# Patient Record
Sex: Male | Born: 1990
Health system: Southern US, Community
[De-identification: ages and names within clinical notes are randomized; demographics above are authoritative.]

## PROBLEM LIST (undated history)

## (undated) DIAGNOSIS — K219 Gastro-esophageal reflux disease without esophagitis: Secondary | ICD-10-CM

## (undated) DIAGNOSIS — K589 Irritable bowel syndrome without diarrhea: Secondary | ICD-10-CM

## (undated) DIAGNOSIS — K5090 Crohn's disease, unspecified, without complications: Secondary | ICD-10-CM

## (undated) DIAGNOSIS — T7840XA Allergy, unspecified, initial encounter: Secondary | ICD-10-CM

## (undated) DIAGNOSIS — A048 Other specified bacterial intestinal infections: Secondary | ICD-10-CM

## (undated) DIAGNOSIS — F419 Anxiety disorder, unspecified: Secondary | ICD-10-CM

## (undated) DIAGNOSIS — K3532 Acute appendicitis with perforation and localized peritonitis, without abscess: Secondary | ICD-10-CM

## (undated) DIAGNOSIS — K529 Noninfective gastroenteritis and colitis, unspecified: Secondary | ICD-10-CM

## (undated) DIAGNOSIS — K635 Polyp of colon: Secondary | ICD-10-CM

## (undated) DIAGNOSIS — K632 Fistula of intestine: Secondary | ICD-10-CM

## (undated) HISTORY — DX: Acute appendicitis with perforation, localized peritonitis, and gangrene, without abscess: K35.32

## (undated) HISTORY — DX: Gastro-esophageal reflux disease without esophagitis: K21.9

## (undated) HISTORY — DX: Allergy, unspecified, initial encounter: T78.40XA

## (undated) HISTORY — DX: Fistula of intestine: K63.2

## (undated) HISTORY — PX: APPENDECTOMY: SHX54

## (undated) HISTORY — DX: Anxiety disorder, unspecified: F41.9

## (undated) HISTORY — DX: Crohn's disease, unspecified, without complications: K50.90

## (undated) HISTORY — DX: Other specified bacterial intestinal infections: A04.8

## (undated) HISTORY — DX: Noninfective gastroenteritis and colitis, unspecified: K52.9

## (undated) HISTORY — DX: Polyp of colon: K63.5

## (undated) HISTORY — DX: Irritable bowel syndrome, unspecified: K58.9

## (undated) HISTORY — PX: OTHER SURGICAL HISTORY: SHX169

---
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
## 2009-11-17 ENCOUNTER — Ambulatory Visit: Payer: Self-pay | Admitting: Internal Medicine

## 2009-11-18 ENCOUNTER — Encounter: Payer: Self-pay | Admitting: Internal Medicine

## 2010-06-06 ENCOUNTER — Telehealth: Payer: Self-pay | Admitting: Internal Medicine

## 2010-12-10 LAB — CONVERTED CEMR LAB
Cholesterol: 195 mg/dL (ref 0–200)
HDL: 50.9 mg/dL (ref >39.00)
Triglycerides: 40 mg/dL (ref 0.0–149.0)
VLDL: 8 mg/dL (ref 0.0–40.0)

## 2010-12-12 NOTE — Progress Notes (Signed)
Summary: ER  Phone Note Call from Patient   Summary of Call: Spoke w/pt's mother. Pt is student at [employer] this summer. He was home this weekend and c/o "not feeling well". No fever or URI symptoms. C/o stomach discomfort and diarrhea sunday evening. Pt spoke to his mother breifly this am prior to going to class this am. He still didn't feel well, drank some water and then vomited. He said there was blood in vomit. She was not sure how much blood or if pt had any other symptoms. Advised ER if pt had more than trace of blood in vomit, weakness, continued diarrhea or any other symptoms. She will attempt to contact pt and call office back with any further questions.  Initial call taken by: Charlsie Quest, [place],  June 06, 2010 10:07 AM  Follow-up for Phone Call        Pts mother called back. She spoke w/pt and he states that the blood was a large "glob". Overall feels very bad and has had continued diarrhea all night. Advised he have someone take him to ER now, not wait until she can get to [facility]. She agreed and will f/u w/Dr Ronnald Ramp after ER.  Follow-up by: Charlsie Quest, [place],  June 06, 2010 10:12 AM  Additional Follow-up for Phone Call Additional follow up Details #1::        thanks Additional Follow-up by: Janith Lima MD,  June 06, 2010 10:24 AM

## 2010-12-12 NOTE — Assessment & Plan Note (Signed)
Summary: BREATHING PROBLEMS- X SEVERAL MONTH-BCBS--PKG/OFF--STC   Vital Signs:  Patient profile:   20 year old male Height:      68 inches Weight:      191 pounds BMI:     29.15 O2 Sat:      97 % on Room air Temp:     98.9 degrees F rectal Pulse rate:   81 / minute Pulse rhythm:   regular Resp:     16 per minute BP sitting:   118 / 80  (left arm) Cuff size:   large  Vitals Entered By: McKenzie (November 17, 2009 1:21 PM)  Nutrition Counseling: Patient's BMI is greater than 25 and therefore counseled on weight management options.  O2 Flow:  Room air CC: New pt/CPX Is Patient Diabetic? No Pain Assessment Patient in pain? no        Primary Care Provider:  Janith Lima MD  CC:  New pt/CPX.  History of Present Illness: New to me for a complete physical. He reports that he feels like he can't get a deep satisfying breath in the AM occasionally, this has been going on for several years with no recent exacerbations.  Preventive Screening-Counseling & Management  Alcohol-Tobacco     Alcohol drinks/day: 2     Alcohol type: beer     >5/day in last 3 mos: yes     Alcohol Counseling: to decrease amount and/or frequency of alcohol intake     Feels need to cut down: no     Feels annoyed by complaints: no     Feels guilty re: drinking: no     Needs 'eye opener' in am: no     Smoking Status: never  Caffeine-Diet-Exercise     Does Patient Exercise: yes  Hep-HIV-STD-Contraception     Hepatitis Risk: no risk noted     HIV Risk: no risk noted     STD Risk: no risk noted     TSE monthly: yes     Testicular SE Education/Counseling to perform regular STE      Drug Use:  no.    Current Medications (verified): 1)  None  Allergies (verified): No Known Drug Allergies  Past History:  Past Medical History: Unremarkable  Past Surgical History: Denies surgical history  Social History: Occupation: Ship broker at [employer] Never Smoked Alcohol  use-yes Drug use-no Regular exercise-yes Smoking Status:  never Hepatitis Risk:  no risk noted HIV Risk:  no risk noted STD Risk:  no risk noted Drug Use:  no Does Patient Exercise:  yes  Review of Systems       The patient complains of weight gain.  The patient denies anorexia, fever, weight loss, chest pain, syncope, dyspnea on exertion, peripheral edema, prolonged cough, headaches, hemoptysis, abdominal pain, severe indigestion/heartburn, hematuria, genital sores, muscle weakness, suspicious skin lesions, difficulty walking, depression, abnormal bleeding, enlarged lymph nodes, and testicular masses.    Physical Exam  General:  alert, well-developed, well-nourished, well-hydrated, appropriate dress, normal appearance, healthy-appearing, cooperative to examination, and good hygiene.   Head:  normocephalic, atraumatic, no abnormalities observed, and no abnormalities palpated.   Ears:  R ear normal and L ear normal.   Mouth:  Oral mucosa and oropharynx without lesions or exudates.  Teeth in good repair. Neck:  supple, full ROM, no masses, no thyromegaly, no JVD, normal carotid upstroke, no carotid bruits, and no cervical lymphadenopathy.   Chest Wall:  No deformities, masses, tenderness or gynecomastia noted. Lungs:  Normal respiratory effort, chest expands symmetrically. Lungs are clear to auscultation, no crackles or wheezes. Heart:  Normal rate and regular rhythm. S1 and S2 normal without gallop, murmur, click, rub or other extra sounds. Abdomen:  soft, non-tender, normal bowel sounds, no distention, no masses, no guarding, no rigidity, no rebound tenderness, no hepatomegaly, and no splenomegaly.   Genitalia:  circumcised, no hydrocele, no varicocele, no scrotal masses, no testicular masses or atrophy, no cutaneous lesions, and no urethral discharge.   Msk:  normal ROM, no joint tenderness, no joint swelling, no joint warmth, no redness over joints, no joint deformities, no joint  instability, and no crepitation.   Pulses:  R and L carotid,radial,femoral,dorsalis pedis and posterior tibial pulses are full and equal bilaterally Extremities:  No clubbing, cyanosis, edema, or deformity noted with normal full range of motion of all joints.   Neurologic:  No cranial nerve deficits noted. Station and gait are normal. Plantar reflexes are down-going bilaterally. DTRs are symmetrical throughout. Sensory, motor and coordinative functions appear intact. Skin:  turgor normal, color normal, no rashes, no suspicious lesions, no ecchymoses, no petechiae, no purpura, no ulcerations, and no edema.   Cervical Nodes:  no anterior cervical adenopathy and no posterior cervical adenopathy.   Axillary Nodes:  no R axillary adenopathy and no L axillary adenopathy.   Inguinal Nodes:  no R inguinal adenopathy and no L inguinal adenopathy.   Psych:  Oriented X3, memory intact for recent and remote, normally interactive, good eye contact, not anxious appearing, not depressed appearing, and not agitated.   Additional Exam:  EKG shows incomplete, insignificant RBBB but otherwise normal.   Impression & Recommendations:  Problem # 1:  ROUTINE GENERAL MEDICAL EXAM@HEALTH  CARE FACL (ICD-V70.0) Assessment New  Td Booster: Historical (11/13/2003)    Discussed using sunscreen, use of alcohol, drug use, self testicular exam, routine dental care, routine eye care, routine physical exam, seat belts, multiple vitamins,  and recommendations for immunizations.  Discussed exercise and checking cholesterol.  Discussed gun safety, safe sex, and contraception.   Orders: EKG w/ Interpretation (93000) Venipuncture (57903) TLB-Lipid Panel (80061-LIPID)  Patient Instructions: 1)  Please schedule a follow-up appointment as needed. 2)  It is important that you exercise regularly at least 20 minutes 5 times a week. If you develop chest pain, have severe difficulty breathing, or feel very tired , stop exercising  immediately and seek medical attention. 3)  You need to lose weight. Consider a lower calorie diet and regular exercise.  4)  If you could be exposed to sexually transmitted diseases, you should use a condom.  Preventive Care Screening  Last Tetanus Booster:    Date:  11/13/2003    Results:  Historical    Appended Document: BREATHING PROBLEMS- X SEVERAL MONTH-BCBS--PKG/OFF--STC    Family History: Family History Diabetes 1st degree relative Family History Lung cancer

## 2010-12-12 NOTE — Letter (Signed)
Summary: Lipid Letter  [facility] [address]   [place], [place] [address]   Phone: [phone]  Fax: [phone]    11/18/2009  [address] [place], [place]  [address]  Dear Randall Hiss:  We have carefully reviewed your last lipid profile from 11/17/2009 and the results are noted below with a summary of recommendations for lipid management.    Cholesterol:       195     Goal: <200   HDL "good" Cholesterol:   50.90     Goal: >40   LDL "bad" Cholesterol:   136     Goal: <130   Triglycerides:       40.0     Goal: <150    GREAT RESULTS!    TLC Diet (Therapeutic Lifestyle Change): Saturated Fats & Transfatty acids should be kept < 7% of total calories ***Reduce Saturated Fats Polyunstaurated Fat can be up to 10% of total calories Monounsaturated Fat Fat can be up to 20% of total calories Total Fat should be no greater than 25-35% of total calories Carbohydrates should be 50-60% of total calories Protein should be approximately 15% of total calories Fiber should be at least 20-30 grams a day ***Increased fiber may help lower LDL Total Cholesterol should be < 276m/day Consider adding plant stanol/sterols to diet (example: Benacol spread) ***A higher intake of unsaturated fat may reduce Triglycerides and Increase HDL    Adjunctive Measures (may lower LIPIDS and reduce risk of Heart Attack) include: Aerobic Exercise (20-30 minutes 3-4 times a week) Limit Alcohol Consumption Weight Reduction Aspirin 75-81 mg a day by mouth (if not allergic or contraindicated) Dietary Fiber 20-30 grams a day by mouth     Current Medications:  None If you have any questions, please call. We appreciate being able to work with you.   Sincerely,    [facility] TJanith LimaMD

## 2011-03-30 NOTE — Op Note (Signed)
[name]  Patient:    Jacob [name], Jacob [name] Visit Number: [id number] MRN: [id number]          Service Type: DSU Location: [facility] Attending Physician:  Gerald Stabs Dictated by:   Jerilynn Mages. Gerald Stabs, M.D. Proc. Date: 02/26/02 Admit Date:  02/26/2002 Discharge Date: 02/26/2002   CC:         Marylou Flesher, M.D.   Operative Report  PREOPERATIVE DIAGNOSIS:  Right upper chest wall nodule.  POSTOPERATIVE DIAGNOSIS:  Right upper chest wall nodule.  PROCEDURE PERFORMED:  Excision of right upper chest wall nodule with layered closure.  ANESTHESIA:  Topical plus local anesthesia.  SURGEON:  Dr. Alcide Goodness.  ASSISTANT:  Nurse.  PROCEDURE IN DETAIL:  The procedure was performed in the minor operating room where the patient was brought in and placed supine on the operating room table. EMLA cream for topical anesthetic effect was placed about 45 minutes prior to the procedure. The right upper chest wall over the nodule was cleaned, prepped and draped in the usual sterile fashion. Approximately 5 cc of 1% lidocaine without epinephrine was infiltrated over and around the nodular swelling in the chest wall. The nodule was marked beforehand for the incision. a transverse incision just above the swelling was then made with the knife. The incision measured about 1.5 - 2 cm. The incision was made after testing for the anesthetic effect of the local anesthesia, and the right dissection into subcutaneous plane was continued with the help of a fine-tipped hemostat around the nodular swelling which was deep to the subcutaneous plane lying above the muscles. A well-circumscribed bilobed nodule was found which was completely dissected. The pedicle contained a fair sized feeding vessel which was divided between _______ and ligated using 5-0 Vicryl. After division of the pedicle-containing vessel, the nodule was free and removed from the field. It measured about 1  cm and was bilobed; sent for histopathology. The wound was now irrigated. Oozing and bleeding ________cauterizing with hand-held battery-operated cautery, and then the wound was closed in 2 layers; the deeper subcutaneous layer was approximated using 5-0 Vicryl interrupted sutures, and skin was closed with 6-0 Prolene pulled through subcuticular stitch. Steri-Strips were applied which was covered with a sterile gauze and ______ dressing. The patient tolerated the procedure very well which was smooth and uneventful. The patient was later allowed to go home with instructions to follow up in 1 week for suture removal. Dictated by:   M. Gerald Stabs, M.D. Attending Physician:  Gerald Stabs DD:  02/27/02 TD:  03/02/02 Job: [id number]

## 2012-07-21 ENCOUNTER — Encounter (INDEPENDENT_AMBULATORY_CARE_PROVIDER_SITE_OTHER): Payer: Self-pay | Admitting: Surgery

## 2012-07-21 ENCOUNTER — Ambulatory Visit (INDEPENDENT_AMBULATORY_CARE_PROVIDER_SITE_OTHER): Payer: BC Managed Care – PPO | Admitting: Surgery

## 2012-07-21 ENCOUNTER — Telehealth (INDEPENDENT_AMBULATORY_CARE_PROVIDER_SITE_OTHER): Payer: Self-pay | Admitting: General Surgery

## 2012-07-21 VITALS — BP 114/80 | HR 78 | Temp 98.3°F | Resp 12 | Ht 68.0 in | Wt 189.8 lb

## 2012-07-21 DIAGNOSIS — K3532 Acute appendicitis with perforation and localized peritonitis, without abscess: Secondary | ICD-10-CM | POA: Insufficient documentation

## 2012-07-21 DIAGNOSIS — K352 Acute appendicitis with generalized peritonitis, without abscess: Secondary | ICD-10-CM

## 2012-07-21 NOTE — Telephone Encounter (Signed)
Mother of pt calling to let Dr. Hassell Done know son is home from [place], where he had appendicitis.  Pt's father spoke with Dr. Hassell Done about treating medically in [place] and postponing surgery until he comes home to [place].  Pt home now on Lortab (prn) Flagyl TID and Levoquin 750 mg QD.   He has all hospital notes and copies of his imaging.  Spoke with Dr. Hassell Done, who advises parents to bring him in to the office now.  Mother understands and will comply.

## 2012-07-21 NOTE — Patient Instructions (Signed)
Thanks for your patience.  If you need further assistance after leaving the office, please call our office and speak with a [facility] nurse.  [phone].  If you want to leave a message for Dr. Hassell Done, please call his office phone at [phone].

## 2012-07-21 NOTE — Progress Notes (Signed)
Chief Complaint:  Ruptured appendicitis diagnosed in [place]  History of Present Illness:  Jacob [name] is an 21 y.o. male from [place] who had abdominal pain developing for several weeks before finally presenting with evidence by CT of a ruptured appendicitis.  He had a CT scan done on September 4 and then on July 20, 2012 and he brought those up on a good which I gave back to him after reviewing. I asked him to take that for his subsequent followup CT scan which he may do in 2-3 weeks. In the meantime he is on Levaquin and metronidazole for 2 weeks. He also has a prescription for Vicodin for pain. Currently he is not really having any the catheter peritoneal signs he had before and is very pain-free. He's not had any fevers or chills is able to take a low-residue diet.  I recommended he stay on a low-residue diet, and maintain his antibiotics reassess him in about 2 weeks. We will then make plans for rescan him in probably 4 scheduling some interval appendectomy.  He asked me about traveling since he is a [employer]. I told him I would wait and we can make a decision after see me again to make sure he is doing well.  No past medical history on file.  No past surgical history on file.  Current Outpatient Prescriptions  Medication Sig Dispense Refill  . HYDROcodone-acetaminophen (LORTAB) 7.5-500 MG per tablet Take 1 tablet by mouth every 4 (four) hours as needed.      Marland Kitchen levofloxacin (LEVAQUIN) 750 MG tablet Take 750 mg by mouth daily.      . metroNIDAZOLE (FLAGYL) 500 MG tablet Take 500 mg by mouth 3 (three) times daily.       Review of patient's allergies indicates no known allergies. Family History  Problem Relation Age of Onset  . Lung cancer     Social History:   reports that he has never smoked. He does not have any smokeless tobacco history on file. He reports that he does not drink alcohol or use illicit drugs.   REVIEW OF SYSTEMS - PERTINENT POSITIVES  ONLY: neg  Physical Exam:   Blood pressure 114/80, pulse 78, temperature 98.3 F (36.8 C), resp. rate 12, height 5' 8"  (1.727 m), weight 189 lb 12.8 oz (86.093 kg). Body mass index is 28.86 kg/(m^2).  Gen:  WDWN WM NAD  Neurological: Alert and oriented to person, place, and time. Motor and sensory function is grossly intact  Head: Normocephalic and atraumatic.  Eyes: Conjunctivae are normal. Pupils are equal, round, and reactive to light. No scleral icterus.  Neck: Normal range of motion. Neck supple. No tracheal deviation or thyromegaly present.  Cardiovascular:  SR without murmurs or gallops.  No carotid bruits Respiratory: Effort normal.  No respiratory distress. No chest wall tenderness. Breath sounds normal.  No wheezes, rales or rhonchi.  Abdomen:  No rebound or guarding.  nontender GU: Musculoskeletal: Normal range of motion. Extremities are nontender. No cyanosis, edema or clubbing noted Lymphadenopathy: No cervical, preauricular, postauricular or axillary adenopathy is present Skin: Skin is warm and dry. No rash noted. No diaphoresis. No erythema. No pallor. Pscyh: Normal mood and affect. Behavior is normal. Judgment and thought content normal.   LABORATORY RESULTS: No results found for this or any previous visit (from the past 48 hour(s)).  RADIOLOGY RESULTS: No results found.  Problem List: Patient Active Problem List  Diagnosis  . Ruptured appendicitis-nonop management begun Sept 2013  Assessment & Plan: Appendiceal abscess/phlegmon.  Will treat with oral antibiotics and followup in 2 weeks.      Matt B. Hassell Done, MD, [facility] [phone] beeper [phone]  07/21/2012 5:01 PM

## 2012-08-02 ENCOUNTER — Telehealth (INDEPENDENT_AMBULATORY_CARE_PROVIDER_SITE_OTHER): Payer: Self-pay | Admitting: General Surgery

## 2012-08-02 NOTE — Telephone Encounter (Signed)
Out of town and called with RLQ pains.  I was in surgery and recommended that he go to the nearest ER for evaluation.  I now called him back after I was out of surgery and he says that he is feeling well now.  He denied fevers or chillis or nausea or bloating/distension.  His pain in improved as well.  I recommended that if he has any return of the pain, fevers, chills, nausea/vomiting that he should go to the ER asap for evaluation. He is scheduled to see Dr. Hassell Done next week.

## 2012-08-04 ENCOUNTER — Telehealth (INDEPENDENT_AMBULATORY_CARE_PROVIDER_SITE_OTHER): Payer: Self-pay | Admitting: General Surgery

## 2012-08-04 DIAGNOSIS — R109 Unspecified abdominal pain: Secondary | ICD-10-CM

## 2012-08-04 DIAGNOSIS — K3532 Acute appendicitis with perforation and localized peritonitis, without abscess: Secondary | ICD-10-CM

## 2012-08-04 MED ORDER — HYDROCODONE-ACETAMINOPHEN 7.5-500 MG PO TABS: 1.0000 | ORAL_TABLET | ORAL | Status: DC | PRN

## 2012-08-04 MED ORDER — METRONIDAZOLE 500 MG PO TABS: 500.0000 mg | ORAL_TABLET | Freq: Three times a day (TID) | ORAL | Status: DC

## 2012-08-04 MED ORDER — LEVOFLOXACIN 750 MG PO TABS: 750.0000 mg | ORAL_TABLET | Freq: Every day | ORAL | Status: DC

## 2012-08-04 NOTE — Telephone Encounter (Signed)
Patient's mother called stating patient has had extreme pain on and off over the weekend status post appendiceal infection and they do not want to wait to be seen on Thursday. He finished his antibiotics yesterday. He is in no pain now but it will shoot up to over a 10/10 and then he will have no pain again. He is having difficulty moving his bowels. He has no appetite. His mother is very concerned. Please advise. Spoke with Dr Hassell Done who wants to continue patient's antibiotics and do a repeat CT scan on patient. Prescriptions called to [facility] on [address] per patient request. CT set up at [place] for tomorrow at 10:30am. Patient aware.

## 2012-08-05 ENCOUNTER — Ambulatory Visit
Admission: RE | Admit: 2012-08-05 | Discharge: 2012-08-05 | Disposition: A | Discharge status: Home / Self Care | Payer: Self-pay | Source: Ambulatory Visit | Attending: Surgery | Admitting: Surgery

## 2012-08-05 ENCOUNTER — Telehealth (INDEPENDENT_AMBULATORY_CARE_PROVIDER_SITE_OTHER): Payer: Self-pay | Admitting: General Surgery

## 2012-08-05 DIAGNOSIS — R109 Unspecified abdominal pain: Secondary | ICD-10-CM

## 2012-08-05 DIAGNOSIS — K3532 Acute appendicitis with perforation and localized peritonitis, without abscess: Secondary | ICD-10-CM

## 2012-08-05 MED ORDER — IOHEXOL 300 MG/ML  SOLN
100.0000 mL | Freq: Once | INTRAMUSCULAR | Status: AC | PRN
  Administered 2012-08-05: 100 mL via INTRAVENOUS

## 2012-08-05 NOTE — Telephone Encounter (Signed)
PTS MOTHER CALLED TO CHECK ON CT SCAN RESULTS AND SEE IF Jacob [name] NEEDS TO BE SEEN ON Thursday? I REVIEWED CT RESULTS WITH DR. MARTIN AND HE SAID HE IS WAITING FOR SECONDARY INFLAMMATION TO GET BETTER AND THAT HE DID NEED TO SEE Jacob [name] ON Thursday TO PHYSICALLY ASSESS MR [place]s CONDITION/PT'S MOTHER NOTIFIED/GY

## 2012-08-07 ENCOUNTER — Ambulatory Visit (INDEPENDENT_AMBULATORY_CARE_PROVIDER_SITE_OTHER): Payer: BC Managed Care – PPO | Admitting: Surgery

## 2012-08-07 ENCOUNTER — Encounter (INDEPENDENT_AMBULATORY_CARE_PROVIDER_SITE_OTHER): Payer: Self-pay | Admitting: Surgery

## 2012-08-07 VITALS — BP 132/76 | HR 74 | Temp 97.8°F | Resp 16 | Ht 68.0 in | Wt 188.2 lb

## 2012-08-07 DIAGNOSIS — K352 Acute appendicitis with generalized peritonitis, without abscess: Secondary | ICD-10-CM

## 2012-08-07 DIAGNOSIS — K3532 Acute appendicitis with perforation and localized peritonitis, without abscess: Secondary | ICD-10-CM

## 2012-08-07 NOTE — Progress Notes (Signed)
Chief Complaint:  Ruptured appendicitis diagnosed in [place]  History of Present Illness:  Jacob [name] is an 21 y.o. male from [place] who had abdominal pain developing for several weeks before finally presenting with evidence by CT of a ruptured appendicitis.  He had a CT scan done on September 4 and then on July 20, 2012 and he brought those up on a good which I gave back to him after reviewing. I asked him to take that for his subsequent followup CT scan which he may do in 2-3 weeks. In the meantime he is on Levaquin and metronidazole for 2 weeks. He also has a prescription for Vicodin for pain. Currently he is not really having any the catheter peritoneal signs he had before and is very pain-free. He's not had any fevers or chills is able to take a low-residue diet.  I recommended he stay on a low-residue diet, and maintain his antibiotics reassess him in about 2 weeks. We will then make plans for rescan him in probably 4 scheduling some interval appendectomy.  He asked me about traveling since he is a [employer]. I told him I would wait and we can make a decision after see me again to make sure he is doing well.  Past Medical History  Diagnosis Date  . Acute appendicitis with rupture     History reviewed. No pertinent past surgical history.  Current Outpatient Prescriptions  Medication Sig Dispense Refill  . HYDROcodone-acetaminophen (LORTAB) 7.5-500 MG per tablet Take 1 tablet by mouth every 4 (four) hours as needed.  30 tablet  0  . levofloxacin (LEVAQUIN) 750 MG tablet Take 1 tablet (750 mg total) by mouth daily.  14 tablet  0  . metroNIDAZOLE (FLAGYL) 500 MG tablet Take 1 tablet (500 mg total) by mouth 3 (three) times daily.  42 tablet  0   Review of patient's allergies indicates no known allergies. Family History  Problem Relation Age of Onset  . Lung cancer     Social History:   reports that he has never smoked. He does not have any smokeless tobacco history  on file. He reports that he does not drink alcohol or use illicit drugs.   REVIEW OF SYSTEMS - PERTINENT POSITIVES ONLY: neg  Physical Exam:   Blood pressure 132/76, pulse 74, temperature 97.8 F (36.6 C), temperature source Temporal, resp. rate 16, height 5' 8"  (1.727 m), weight 188 lb 4 oz (85.39 kg). Body mass index is 28.62 kg/(m^2).  Gen:  WDWN WM NAD  Neurological: Alert and oriented to person, place, and time. Motor and sensory function is grossly intact  Head: Normocephalic and atraumatic.  Eyes: Conjunctivae are normal. Pupils are equal, round, and reactive to light. No scleral icterus.  Neck: Normal range of motion. Neck supple. No tracheal deviation or thyromegaly present.  Cardiovascular:  SR without murmurs or gallops.  No carotid bruits Respiratory: Effort normal.  No respiratory distress. No chest wall tenderness. Breath sounds normal.  No wheezes, rales or rhonchi.  Abdomen:  No rebound or guarding.  nontender GU: Musculoskeletal: Normal range of motion. Extremities are nontender. No cyanosis, edema or clubbing noted Lymphadenopathy: No cervical, preauricular, postauricular or axillary adenopathy is present Skin: Skin is warm and dry. No rash noted. No diaphoresis. No erythema. No pallor. Pscyh: Normal mood and affect. Behavior is normal. Judgment and thought content normal.   LABORATORY RESULTS: No results found for this or any previous visit (from the past 48 hour(s)).  RADIOLOGY RESULTS:  No results found.  Problem List: Patient Active Problem List  Diagnosis  . Ruptured appendicitis-nonop management begun Sept 2013    Assessment & Plan: Appendiceal abscess/phlegmon.  Will treat with oral antibiotics and followup in 2 weeks.      Matt B. Hassell Done, MD, [facility] [phone] beeper [phone]  08/07/2012 5:29 PM  Had a bout of pain while playing cards at [facility].  He won 6th out of 1200.  Still on Levoquin and Flagyl.     We reviewed his CT scan and I hope that we can wait a little longer before appendectomy.  There is a target sign in the TI that may represent inflammation.   Plan see him next Thursday.

## 2012-08-11 ENCOUNTER — Telehealth (INDEPENDENT_AMBULATORY_CARE_PROVIDER_SITE_OTHER): Payer: Self-pay | Admitting: General Surgery

## 2012-08-11 NOTE — Telephone Encounter (Signed)
Pt called to reschedule his appt (needs to be out of town) and to request additional pain med to bridge.  Page and updated Dr. Hassell Done.  OKd to call in Hydrocodone 5/325 mg, # 30, 1-2 po Q4-6H prn pain, no refill.  Called to [place]:  [phone].

## 2012-08-14 ENCOUNTER — Encounter (INDEPENDENT_AMBULATORY_CARE_PROVIDER_SITE_OTHER): Payer: BC Managed Care – PPO | Admitting: Surgery

## 2012-08-27 ENCOUNTER — Ambulatory Visit (INDEPENDENT_AMBULATORY_CARE_PROVIDER_SITE_OTHER): Payer: BC Managed Care – PPO | Admitting: Surgery

## 2012-08-27 ENCOUNTER — Encounter (INDEPENDENT_AMBULATORY_CARE_PROVIDER_SITE_OTHER): Payer: Self-pay | Admitting: Surgery

## 2012-08-27 VITALS — BP 126/80 | HR 72 | Temp 97.8°F | Resp 16 | Ht 68.0 in | Wt 196.0 lb

## 2012-08-27 DIAGNOSIS — K352 Acute appendicitis with generalized peritonitis, without abscess: Secondary | ICD-10-CM

## 2012-08-27 DIAGNOSIS — K3532 Acute appendicitis with perforation and localized peritonitis, without abscess: Secondary | ICD-10-CM

## 2012-08-27 NOTE — Progress Notes (Addendum)
Chief Complaint:  History of ruptured appendix in [place]   History of Present Illness:  Jacob [name] is an 21 y.o. male who will be 3 months out from his ruptured appendix around Thankgiving and will need interval appendectomy.  Informed consent obtained.    Past Medical History  Diagnosis Date  . Acute appendicitis with rupture     History reviewed. No pertinent past surgical history.  No current outpatient prescriptions on file.   Review of patient's allergies indicates no known allergies. Family History  Problem Relation Age of Onset  . Lung cancer     Social History:   reports that he has never smoked. He does not have any smokeless tobacco history on file. He reports that he does not drink alcohol or use illicit drugs.   REVIEW OF SYSTEMS - PERTINENT POSITIVES ONLY: Just won poker tournament in [place]  Physical Exam:   Blood pressure 126/80, pulse 72, temperature 97.8 F (36.6 C), temperature source Temporal, resp. rate 16, height 5' 8"  (1.727 m), weight 196 lb (88.905 kg). Body mass index is 29.80 kg/(m^2).  Gen:  WDWN WM NAD  Neurological: Alert and oriented to person, place, and time. Motor and sensory function is grossly intact  Head: Normocephalic and atraumatic.  Eyes: Conjunctivae are normal. Pupils are equal, round, and reactive to light. No scleral icterus.  Neck: Normal range of motion. Neck supple. No tracheal deviation or thyromegaly present.  Cardiovascular:  SR without murmurs or gallops.  No carotid bruits Respiratory: Effort normal.  No respiratory distress. No chest wall tenderness. Breath sounds normal.  No wheezes, rales or rhonchi.  Abdomen:  nontender GU: Musculoskeletal: Normal range of motion. Extremities are nontender. No cyanosis, edema or clubbing noted Lymphadenopathy: No cervical, preauricular, postauricular or axillary adenopathy is present Skin: Skin is warm and dry. No rash noted. No diaphoresis. No erythema. No pallor. Pscyh: Normal  mood and affect. Behavior is normal. Judgment and thought content normal.   LABORATORY RESULTS: No results found for this or any previous visit (from the past 48 hour(s)).  RADIOLOGY RESULTS: No results found.  Problem List: Patient Active Problem List  Diagnosis  . Ruptured appendicitis-nonop management begun Sept 2013    Assessment & Plan: History of prior appendiceal rupture Interval appendectomy    Matt B. Hassell Done, MD, [place] [phone] beeper [phone]  08/27/2012 3:00 PM    I hand wrote a script for Percocet 5/325 # 30 for pain.  He requested and I warned him about potential for addiction.

## 2012-08-31 ENCOUNTER — Telehealth (INDEPENDENT_AMBULATORY_CARE_PROVIDER_SITE_OTHER): Payer: Self-pay | Admitting: General Surgery

## 2012-08-31 NOTE — Telephone Encounter (Signed)
He called because he was "hungover" this morning and drank some water and vomited up a few spoonfuls of blood.  He says that he feels fine now.  He denied taking NSAIDS.  He says that he normally has some blood on the tissue when he wipes and occasionally in the stool but has not had black stools or bloody stools.  He does not feel weak or dizzy or SOB.  I explained that I did not feel that this would be likely related to his appendicitis. I did explained that it is not normal to be vomiting blood and recommended that he proceed to the ER for evaluation as this would be the safest thing to do to ensure that this was not anything more severe. He was not interested in going to the ER so I recommended that he at least call Dr. Hassell Done tomorrow and let him know of these findings. Most likely this is related to his drinking but I think that ER evaluation would be best.

## 2012-09-12 ENCOUNTER — Other Ambulatory Visit (INDEPENDENT_AMBULATORY_CARE_PROVIDER_SITE_OTHER): Payer: Self-pay | Admitting: Surgery

## 2012-09-15 ENCOUNTER — Other Ambulatory Visit (INDEPENDENT_AMBULATORY_CARE_PROVIDER_SITE_OTHER): Payer: Self-pay | Admitting: Surgery

## 2012-09-15 ENCOUNTER — Telehealth (INDEPENDENT_AMBULATORY_CARE_PROVIDER_SITE_OTHER): Payer: Self-pay | Admitting: General Surgery

## 2012-09-15 DIAGNOSIS — K3532 Acute appendicitis with perforation and localized peritonitis, without abscess: Secondary | ICD-10-CM

## 2012-09-15 DIAGNOSIS — K37 Unspecified appendicitis: Secondary | ICD-10-CM

## 2012-09-15 MED ORDER — HYDROCODONE-ACETAMINOPHEN 7.5-500 MG PO TABS: 1.0000 | ORAL_TABLET | ORAL | Status: DC | PRN

## 2012-09-15 NOTE — Telephone Encounter (Signed)
Pt called in to remind Dr. Hassell Done that he has thrush on his tongue as well as wanting another Rx for his lortab.  I informed him that I would have to page Dr. Hassell Done and see what he says.  If he agrees to it, he wants it called into [facility] on [address]

## 2012-09-15 NOTE — Telephone Encounter (Signed)
Returned pt call and informed him that Dr. Hassell Done agreed to fill the Rx and advised him to gargle salt water.  I call in Lortab [phone] #30 w/ no refills Q4H prn

## 2012-09-29 ENCOUNTER — Telehealth (INDEPENDENT_AMBULATORY_CARE_PROVIDER_SITE_OTHER): Payer: Self-pay | Admitting: General Surgery

## 2012-09-29 NOTE — Telephone Encounter (Signed)
Pt called for Vicodin refill; surgery scheduled 10/17/12.  Paged and updated Dr. Hassell Done.  Gave OK for another refill.  Called Hydrocodone 5/325 mg, # 30, 1-2 po Q 4-6 H prn pain, no refill to [facility]:  [phone].

## 2012-10-02 ENCOUNTER — Encounter (HOSPITAL_COMMUNITY): Payer: Self-pay | Admitting: Pharmacy Technician

## 2012-10-08 NOTE — Patient Instructions (Addendum)
[name]  10/08/2012   Your procedure is scheduled on: 10/14/12  Report to [facility] at [place] AM.  Call this number if you have problems the morning of surgery 336-: [phone]   Remember: fleets enema night before surgery   Do not eat food or drink liquids After Midnight.     Take these medicines the morning of surgery with A SIP OF WATER: lortab if needed   Do not wear jewelry, make-up or nail polish.  Do not wear lotions, powders, or perfumes. You may wear deodorant.  Do not shave 48 hours prior to surgery. Men may shave face and neck.  Do not bring valuables to the hospital.  Contacts, dentures or bridgework may not be worn into surgery.  Leave suitcase in the car. After surgery it may be brought to your room.  For patients admitted to the hospital, checkout time is 11:00 AM the day of discharge.   Patients discharged the day of surgery will not be allowed to drive home.  Name and phone number of your driver: [name] [phone]   Special Instructions: Shower using CHG 2 nights before surgery and the night before surgery.  If you shower the day of surgery use CHG.  Use special wash - you have one bottle of CHG for all showers.  You should use approximately 1/3 of the bottle for each shower.   Please read over the following fact sheets that you were given: MRSA Information.  Paulette Blanch, RN  pre op nurse call if needed [phone]

## 2012-10-13 ENCOUNTER — Encounter (HOSPITAL_COMMUNITY)
Admission: RE | Admit: 2012-10-13 | Discharge: 2012-10-13 | Disposition: A | Discharge status: Home / Self Care | Payer: BC Managed Care – PPO | Source: Ambulatory Visit | Attending: Surgery | Admitting: Surgery

## 2012-10-13 ENCOUNTER — Encounter (HOSPITAL_COMMUNITY): Payer: Self-pay

## 2012-10-13 LAB — CBC
HCT: 44.9 % (ref 39.0–52.0)
Hemoglobin: 14.8 g/dL (ref 13.0–17.0)
MCHC: 33 g/dL (ref 30.0–36.0)
RBC: 5.57 MIL/uL (ref 4.22–5.81)

## 2012-10-13 LAB — SURGICAL PCR SCREEN: MRSA, PCR: NEGATIVE

## 2012-10-13 MED ORDER — SODIUM CHLORIDE 0.9 % IV SOLN
1.0000 g | INTRAVENOUS | Status: AC
  Administered 2012-10-14: 1 g via INTRAVENOUS
  Filled 2012-10-13: qty 1

## 2012-10-14 ENCOUNTER — Encounter (HOSPITAL_COMMUNITY): Payer: Self-pay | Admitting: *Deleted

## 2012-10-14 ENCOUNTER — Telehealth (INDEPENDENT_AMBULATORY_CARE_PROVIDER_SITE_OTHER): Payer: Self-pay | Admitting: General Surgery

## 2012-10-14 ENCOUNTER — Ambulatory Visit (HOSPITAL_COMMUNITY): Payer: BC Managed Care – PPO | Admitting: Anesthesiology

## 2012-10-14 ENCOUNTER — Encounter (HOSPITAL_COMMUNITY)
Admission: RE | Disposition: A | Discharge status: Home / Self Care | Payer: Self-pay | Source: Ambulatory Visit | Attending: Surgery

## 2012-10-14 ENCOUNTER — Ambulatory Visit (HOSPITAL_COMMUNITY)
Admission: RE | Admit: 2012-10-14 | Discharge: 2012-10-14 | Disposition: A | Discharge status: Home / Self Care | Payer: BC Managed Care – PPO | Source: Ambulatory Visit | Attending: Surgery | Admitting: Surgery

## 2012-10-14 ENCOUNTER — Encounter (HOSPITAL_COMMUNITY): Payer: Self-pay | Admitting: Anesthesiology

## 2012-10-14 DIAGNOSIS — Z79899 Other long term (current) drug therapy: Secondary | ICD-10-CM | POA: Insufficient documentation

## 2012-10-14 DIAGNOSIS — K3580 Unspecified acute appendicitis: Secondary | ICD-10-CM

## 2012-10-14 DIAGNOSIS — K3533 Acute appendicitis with perforation and localized peritonitis, with abscess: Secondary | ICD-10-CM | POA: Insufficient documentation

## 2012-10-14 DIAGNOSIS — K3532 Acute appendicitis with perforation and localized peritonitis, without abscess: Secondary | ICD-10-CM

## 2012-10-14 HISTORY — PX: LAPAROSCOPIC APPENDECTOMY: SHX408

## 2012-10-14 LAB — CBC
MCH: 26.5 pg (ref 26.0–34.0)
MCHC: 33.3 g/dL (ref 30.0–36.0)
Platelets: 338 10*3/uL (ref 150–400)

## 2012-10-14 LAB — CREATININE, SERUM
Creatinine, Ser: 0.77 mg/dL (ref 0.50–1.35)
GFR calc Af Amer: >90 mL/min (ref >90)
GFR calc non Af Amer: >90 mL/min (ref >90)

## 2012-10-14 SURGERY — APPENDECTOMY, LAPAROSCOPIC
Anesthesia: General | Site: Abdomen | Wound class: Clean Contaminated

## 2012-10-14 MED ORDER — CISATRACURIUM BESYLATE (PF) 10 MG/5ML IV SOLN
INTRAVENOUS | Status: DC | PRN
  Administered 2012-10-14: 2 mg via INTRAVENOUS
  Administered 2012-10-14: 6 mg via INTRAVENOUS

## 2012-10-14 MED ORDER — HEPARIN SODIUM (PORCINE) 5000 UNIT/ML IJ SOLN
5000.0000 [IU] | Freq: Once | INTRAMUSCULAR | Status: AC
  Administered 2012-10-14: 5000 [IU] via SUBCUTANEOUS
  Filled 2012-10-14: qty 1

## 2012-10-14 MED ORDER — SODIUM CHLORIDE 0.9 % IV SOLN
INTRAVENOUS | Status: AC
  Filled 2012-10-14: qty 1

## 2012-10-14 MED ORDER — ONDANSETRON HCL 4 MG PO TABS: 4.0000 mg | ORAL_TABLET | Freq: Four times a day (QID) | ORAL | Status: DC | PRN

## 2012-10-14 MED ORDER — ONDANSETRON HCL 4 MG/2ML IJ SOLN: 4.0000 mg | Freq: Four times a day (QID) | INTRAMUSCULAR | Status: DC | PRN

## 2012-10-14 MED ORDER — MORPHINE SULFATE 2 MG/ML IJ SOLN
INTRAMUSCULAR | Status: AC
  Administered 2012-10-14: 1 mg via INTRAVENOUS
  Filled 2012-10-14: qty 1

## 2012-10-14 MED ORDER — OXYCODONE-ACETAMINOPHEN 5-325 MG PO TABS
1.0000 | ORAL_TABLET | ORAL | Status: DC | PRN
Start: 2012-10-14 — End: 2012-10-14
  Administered 2012-10-14 (×2): 2 via ORAL
  Filled 2012-10-14 (×2): qty 2

## 2012-10-14 MED ORDER — DEXTROSE 5 % IV SOLN
1.0000 g | Freq: Four times a day (QID) | INTRAVENOUS | Status: AC
  Administered 2012-10-14: 1 g via INTRAVENOUS
  Filled 2012-10-14: qty 1

## 2012-10-14 MED ORDER — SUCCINYLCHOLINE CHLORIDE 20 MG/ML IJ SOLN
INTRAMUSCULAR | Status: DC | PRN
  Administered 2012-10-14: 100 mg via INTRAVENOUS

## 2012-10-14 MED ORDER — MIDAZOLAM HCL 5 MG/5ML IJ SOLN
INTRAMUSCULAR | Status: DC | PRN
  Administered 2012-10-14: 2 mg via INTRAVENOUS

## 2012-10-14 MED ORDER — BUPIVACAINE-EPINEPHRINE 0.25% -1:200000 IJ SOLN
INTRAMUSCULAR | Status: AC
  Filled 2012-10-14: qty 1

## 2012-10-14 MED ORDER — DEXAMETHASONE SODIUM PHOSPHATE 10 MG/ML IJ SOLN
INTRAMUSCULAR | Status: DC | PRN
  Administered 2012-10-14: 10 mg via INTRAVENOUS

## 2012-10-14 MED ORDER — ACETAMINOPHEN 10 MG/ML IV SOLN
INTRAVENOUS | Status: AC
  Filled 2012-10-14: qty 100

## 2012-10-14 MED ORDER — LACTATED RINGERS IV SOLN: INTRAVENOUS | Status: DC

## 2012-10-14 MED ORDER — MEPERIDINE HCL 50 MG/ML IJ SOLN: 6.2500 mg | INTRAMUSCULAR | Status: DC | PRN

## 2012-10-14 MED ORDER — MORPHINE SULFATE 10 MG/ML IJ SOLN: 1.0000 mg | INTRAMUSCULAR | Status: DC | PRN

## 2012-10-14 MED ORDER — AMPHETAMINE-DEXTROAMPHET ER 10 MG PO CP24: 10.0000 mg | ORAL_CAPSULE | Freq: Every day | ORAL | Status: DC

## 2012-10-14 MED ORDER — HEPARIN SODIUM (PORCINE) 5000 UNIT/ML IJ SOLN
5000.0000 [IU] | Freq: Three times a day (TID) | INTRAMUSCULAR | Status: DC
  Administered 2012-10-14: 5000 [IU] via SUBCUTANEOUS
  Filled 2012-10-14 (×3): qty 1

## 2012-10-14 MED ORDER — GLYCOPYRROLATE 0.2 MG/ML IJ SOLN
INTRAMUSCULAR | Status: DC | PRN
  Administered 2012-10-14: .8 mg via INTRAVENOUS

## 2012-10-14 MED ORDER — FENTANYL CITRATE 0.05 MG/ML IJ SOLN
INTRAMUSCULAR | Status: DC | PRN
  Administered 2012-10-14: 100 ug via INTRAVENOUS
  Administered 2012-10-14: 50 ug via INTRAVENOUS

## 2012-10-14 MED ORDER — KCL IN DEXTROSE-NACL 20-5-0.45 MEQ/L-%-% IV SOLN
INTRAVENOUS | Status: DC
  Administered 2012-10-14: 12:00:00 via INTRAVENOUS
  Filled 2012-10-14: qty 1000

## 2012-10-14 MED ORDER — ONDANSETRON HCL 4 MG/2ML IJ SOLN
INTRAMUSCULAR | Status: DC | PRN
  Administered 2012-10-14: 4 mg via INTRAVENOUS

## 2012-10-14 MED ORDER — PROPOFOL 10 MG/ML IV BOLUS
INTRAVENOUS | Status: DC | PRN
  Administered 2012-10-14: 200 mg via INTRAVENOUS

## 2012-10-14 MED ORDER — LACTATED RINGERS IV SOLN
INTRAVENOUS | Status: DC | PRN
  Administered 2012-10-14: 1000 mL

## 2012-10-14 MED ORDER — INFLUENZA VIRUS VACC SPLIT PF IM SUSP: 0.5000 mL | INTRAMUSCULAR | Status: DC

## 2012-10-14 MED ORDER — KETOROLAC TROMETHAMINE 15 MG/ML IJ SOLN
15.0000 mg | Freq: Once | INTRAMUSCULAR | Status: AC
  Administered 2012-10-14: 15 mg via INTRAVENOUS
  Filled 2012-10-14: qty 1

## 2012-10-14 MED ORDER — ACETAMINOPHEN 10 MG/ML IV SOLN
INTRAVENOUS | Status: DC | PRN
  Administered 2012-10-14: 1000 mg via INTRAVENOUS

## 2012-10-14 MED ORDER — PROMETHAZINE HCL 25 MG/ML IJ SOLN
INTRAMUSCULAR | Status: AC
  Filled 2012-10-14: qty 1

## 2012-10-14 MED ORDER — MORPHINE SULFATE 2 MG/ML IJ SOLN
2.0000 mg | INTRAMUSCULAR | Status: DC | PRN
  Administered 2012-10-14: 1 mg via INTRAVENOUS
  Administered 2012-10-14 (×3): 2 mg via INTRAVENOUS
  Filled 2012-10-14 (×3): qty 1

## 2012-10-14 MED ORDER — PROMETHAZINE HCL 25 MG/ML IJ SOLN
6.2500 mg | INTRAMUSCULAR | Status: DC | PRN
  Administered 2012-10-14: 6.25 mg via INTRAVENOUS

## 2012-10-14 MED ORDER — FENTANYL CITRATE 0.05 MG/ML IJ SOLN
25.0000 ug | INTRAMUSCULAR | Status: DC | PRN
  Administered 2012-10-14 (×2): 25 ug via INTRAVENOUS

## 2012-10-14 MED ORDER — NEOSTIGMINE METHYLSULFATE 1 MG/ML IJ SOLN
INTRAMUSCULAR | Status: DC | PRN
  Administered 2012-10-14: 5 mg via INTRAVENOUS

## 2012-10-14 MED ORDER — LACTATED RINGERS IV SOLN
INTRAVENOUS | Status: DC | PRN
  Administered 2012-10-14 (×2): via INTRAVENOUS

## 2012-10-14 MED ORDER — BUPIVACAINE LIPOSOME 1.3 % IJ SUSP
20.0000 mL | Freq: Once | INTRAMUSCULAR | Status: AC
  Administered 2012-10-14: 20 mL
  Filled 2012-10-14: qty 20

## 2012-10-14 MED ORDER — FENTANYL CITRATE 0.05 MG/ML IJ SOLN
INTRAMUSCULAR | Status: AC
  Filled 2012-10-14: qty 2

## 2012-10-14 SURGICAL SUPPLY — 42 items
APL SKNCLS STERI-STRIP NONHPOA (GAUZE/BANDAGES/DRESSINGS) ×1
APPLIER CLIP ROT 10 11.4 M/L (STAPLE)
APR CLP MED LRG 11.4X10 (STAPLE)
BAG SPEC RTRVL LRG 6X4 10 (ENDOMECHANICALS) ×1
BENZOIN TINCTURE PRP APPL 2/3 (GAUZE/BANDAGES/DRESSINGS) ×2 IMPLANT
CABLE HI FREQUENCY MONOPOLAR (ELECTROSURGICAL) ×1 IMPLANT
CANISTER SUCTION 2500CC (MISCELLANEOUS) ×2 IMPLANT
CLIP APPLIE ROT 10 11.4 M/L (STAPLE) IMPLANT
CLOTH BEACON ORANGE TIMEOUT ST (SAFETY) ×2 IMPLANT
COVER SURGICAL LIGHT HANDLE (MISCELLANEOUS) ×2 IMPLANT
CUTTER FLEX LINEAR 45M (STAPLE) ×1 IMPLANT
DECANTER SPIKE VIAL GLASS SM (MISCELLANEOUS) ×2 IMPLANT
DRAPE LAPAROSCOPIC ABDOMINAL (DRAPES) ×2 IMPLANT
ELECT REM PT RETURN 9FT ADLT (ELECTROSURGICAL) ×2
ELECTRODE REM PT RTRN 9FT ADLT (ELECTROSURGICAL) ×1 IMPLANT
ENDOLOOP SUT PDS II  0 18 (SUTURE)
ENDOLOOP SUT PDS II 0 18 (SUTURE) IMPLANT
GLOVE BIOGEL M 8.0 STRL (GLOVE) ×2 IMPLANT
GLOVE BIOGEL PI IND STRL 7.0 (GLOVE) ×1 IMPLANT
GLOVE BIOGEL PI INDICATOR 7.0 (GLOVE) ×1
GOWN STRL NON-REIN LRG LVL3 (GOWN DISPOSABLE) ×2 IMPLANT
GOWN STRL REIN XL XLG (GOWN DISPOSABLE) ×4 IMPLANT
HAND ACTIVATED (MISCELLANEOUS) ×2 IMPLANT
KIT BASIN OR (CUSTOM PROCEDURE TRAY) ×2 IMPLANT
NS IRRIG 1000ML POUR BTL (IV SOLUTION) ×2 IMPLANT
PENCIL BUTTON HOLSTER BLD 10FT (ELECTRODE) IMPLANT
POUCH SPECIMEN RETRIEVAL 10MM (ENDOMECHANICALS) ×2 IMPLANT
RELOAD 45 VASCULAR/THIN (ENDOMECHANICALS) IMPLANT
RELOAD STAPLE 45 2.5 WHT GRN (ENDOMECHANICALS) IMPLANT
RELOAD STAPLE 45 3.5 BLU ETS (ENDOMECHANICALS) IMPLANT
RELOAD STAPLE TA45 3.5 REG BLU (ENDOMECHANICALS) ×2 IMPLANT
SET IRRIG TUBING LAPAROSCOPIC (IRRIGATION / IRRIGATOR) ×2 IMPLANT
SOLUTION ANTI FOG 6CC (MISCELLANEOUS) ×2 IMPLANT
STRIP CLOSURE SKIN 1/2X4 (GAUZE/BANDAGES/DRESSINGS) ×2 IMPLANT
SUT VIC AB 4-0 SH 18 (SUTURE) ×2 IMPLANT
SYR 30ML LL (SYRINGE) ×2 IMPLANT
TRAY FOLEY CATH 14FRSI W/METER (CATHETERS) ×2 IMPLANT
TRAY LAP CHOLE (CUSTOM PROCEDURE TRAY) ×2 IMPLANT
TROCAR BLADELESS OPT 5 75 (ENDOMECHANICALS) ×1 IMPLANT
TROCAR XCEL BLUNT TIP 100MML (ENDOMECHANICALS) ×2 IMPLANT
TROCAR XCEL NON-BLD 11X100MML (ENDOMECHANICALS) ×2 IMPLANT
TUBING INSUFFLATION 10FT LAP (TUBING) ×2 IMPLANT

## 2012-10-14 NOTE — Anesthesia Postprocedure Evaluation (Signed)
  Anesthesia Post-op Note  Patient: Jacob [name]  Procedure(s) Performed: Procedure(s) (LRB): APPENDECTOMY LAPAROSCOPIC (N/A)  Patient Location: PACU  Anesthesia Type: General  Level of Consciousness: awake and alert   Airway and Oxygen Therapy: Patient Spontanous Breathing  Post-op Pain: mild  Post-op Assessment: Post-op Vital signs reviewed, Patient's Cardiovascular Status Stable, Respiratory Function Stable, Patent Airway and No signs of Nausea or vomiting  Last Vitals:  Filed Vitals:   10/14/12 1200  BP: 127/81  Pulse: 94  Temp: 37.1 C  Resp: 18    Post-op Vital Signs: stable   Complications: No apparent anesthesia complications

## 2012-10-14 NOTE — Discharge Summary (Signed)
Physician Discharge Summary  Patient ID: JMICHAEL GILLE MRN: [id number] DOB/AGE: 16-Sep-1991 21 y.o.  Admit date: 10/14/2012 Discharge date: 10/14/2012  Admission Diagnoses:  History of ruptured appendix  Discharge Diagnoses:  same  Active Problems:  * No active hospital problems. *    Surgery:  Laparoscopic appendectomy  Discharged Condition: improved  Hospital Course:   Had surgery.  Went upstairs and wanted to go home within 6 hours.  Permitted to go home  Consults: none  Significant Diagnostic Studies: none    Discharge Exam: Blood pressure 116/64, pulse 80, temperature 98.6 F (37 C), temperature source Oral, resp. rate 18, height 5' 8"  (1.727 m), weight 200 lb (90.719 kg), SpO2 99.00%. Minimal pain  Disposition: Final discharge disposition not confirmed  Discharge Orders    Future Appointments: Provider: Department: Dept Phone: Center:   10/31/2012 11:00 AM Pedro Earls, MD [facility], [place] [phone] None     Future Orders Please Complete By Expires   Diet - low sodium heart healthy      Increase activity slowly      No wound care          Medication List     As of 10/14/2012  4:20 PM    TAKE these medications         acetaminophen 500 MG tablet   Commonly known as: TYLENOL   Take 500 mg by mouth every 6 (six) hours as needed. Pain      amphetamine-dextroamphetamine 10 MG 24 hr capsule   Commonly known as: ADDERALL XR   Take 10 mg by mouth daily before breakfast.      dextromethorphan-guaiFENesin 30-600 MG per 12 hr tablet   Commonly known as: MUCINEX DM   Take 1 tablet by mouth every 12 (twelve) hours.      fish oil-omega-3 fatty acids 1000 MG capsule   Take 1 g by mouth daily.      HYDROcodone-acetaminophen 7.5-500 MG per tablet   Commonly known as: LORTAB   Take 1 tablet by mouth every 4 (four) hours as needed for pain.      ibuprofen 200 MG tablet   Commonly known as: ADVIL,MOTRIN   Take 200 mg by mouth every 6 (six) hours  as needed. Pain           Follow-up Information    Follow up with Pedro Earls, MD.   Contact information:   [address] [place] [address] [phone]          Signed: Pedro Earls 10/14/2012, 4:20 PM

## 2012-10-14 NOTE — Telephone Encounter (Signed)
LMOM letting pt know his first PO appt w/ Dr. Hassell Done will be on 12:20 at 11:00

## 2012-10-14 NOTE — Op Note (Signed)
Surgeon: Kaylyn Lim, MD, FACS  Asst:  none  Anes:  general  Procedure: Laparoscopic appendectomy  Diagnosis: Prior ruptured appendicitis with phlegmon  Complications: none  EBL:   minimal cc  Description of Procedure:  The patient was taken to [facility] or 6 on Tuesday, 10/14/2012. After general anesthesia was administered the abdomen was prepped with PCMX and draped sterilely. Patient had a small umbilical hernia and a cut down into this and entered the abdomen without difficulty using a Hassan technique. A 5 mm was placed obliquely in the right upper quadrant and a 11 was placed in left lower quadrant both obliquely.  The appendix came off the cecum and went laterally into the pelvis. It was densely adherent to the mesentery of the small bowel into the pelvic sidewall. Using an angled scope I was able to bluntly tease this and breakdown these chronic adhesions and isolate the base. I then went beneath the base and created an opening through which I could pass the endoscopic stapler using a blue load. Fired this and transected the appendix at its base. A good staple line was present and no bleeding was seen. I then used a harmonic scalpel to cut through the mesentery the appendix and complete its disconnection. He was then placed in a bag and brought to the umbilicus. There was really no spillage. I irrigated inspected the bed of the appendix and the stump and saw no evidence of any bleeding. Everything looked to be in order. A looked up in the liver which looked to be healthy with a normal gallbladder and normal appearing stomach. The umbilical defect was repaired with multiple interrupted 0 Vicryls using the angle angle scope laparoscopically to assess that. In addition the wound was then injected with Exparel and closed with 4-0 Vicryl and with Dermabond. Patient was taken recovery room in satisfactory condition. Be kept overnight for observation.  Matt B. Hassell Done, [place], [facility],  [place]

## 2012-10-14 NOTE — Transfer of Care (Signed)
Immediate Anesthesia Transfer of Care Note  Patient: Jacob [name]  Procedure(s) Performed: Procedure(s) (LRB) with comments: APPENDECTOMY LAPAROSCOPIC (N/A)  Patient Location: PACU  Anesthesia Type:General  Level of Consciousness: awake, sedated and patient cooperative  Airway & Oxygen Therapy: Patient Spontanous Breathing and Patient connected to face mask oxygen  Post-op Assessment: Report given to PACU RN and Post -op Vital signs reviewed and stable  Post vital signs: Reviewed and stable  Complications: No apparent anesthesia complications

## 2012-10-14 NOTE — Anesthesia Preprocedure Evaluation (Signed)
Anesthesia Evaluation  Patient identified by MRN, date of birth, ID band Patient awake    Reviewed: Allergy & Precautions, H&P , NPO status , Patient's Chart, lab work & pertinent test results  Airway Mallampati: II TM Distance: >3 FB Neck ROM: full    Dental No notable dental hx.    Pulmonary neg pulmonary ROS,  breath sounds clear to auscultation  Pulmonary exam normal       Cardiovascular Exercise Tolerance: Good negative cardio ROS  Rhythm:regular Rate:Normal     Neuro/Psych negative neurological ROS  negative psych ROS   GI/Hepatic negative GI ROS, Neg liver ROS,   Endo/Other  negative endocrine ROS  Renal/GU negative Renal ROS  negative genitourinary   Musculoskeletal   Abdominal   Peds  Hematology negative hematology ROS (+)   Anesthesia Other Findings   Reproductive/Obstetrics negative OB ROS                           Anesthesia Physical Anesthesia Plan  ASA: I  Anesthesia Plan: General and General ETT   Post-op Pain Management:    Induction:   Airway Management Planned:   Additional Equipment:   Intra-op Plan:   Post-operative Plan:   Informed Consent: I have reviewed the patients History and Physical, chart, labs and discussed the procedure including the risks, benefits and alternatives for the proposed anesthesia with the patient or authorized representative who has indicated his/her understanding and acceptance.   Dental Advisory Given  Plan Discussed with: CRNA  Anesthesia Plan Comments:         Anesthesia Quick Evaluation

## 2012-10-14 NOTE — H&P (Signed)
Chief Complaint: Ruptured appendicitis diagnosed in [place]  History of Present Illness: Jacob [name] is an 21 y.o. male from [place] who had abdominal pain developing for several weeks before finally presenting with evidence by CT of a ruptured appendicitis. He had a CT scan done on September 4 and then on July 20, 2012 and he brought those up on a good which I gave back to him after reviewing. I asked him to take that for his subsequent followup CT scan which he may do in 2-3 weeks. In the meantime he is on Levaquin and metronidazole for 2 weeks. He also has a prescription for Vicodin for pain. Currently he is not really having any the catheter peritoneal signs he had before and is very pain-free. He's not had any fevers or chills is able to take a low-residue diet.  I recommended he stay on a low-residue diet, and maintain his antibiotics reassess him in about 2 weeks. We will then make plans for rescan him in probably 4 scheduling some interval appendectomy.  He asked me about traveling since he is a [employer]. I told him I would wait and we can make a decision after see me again to make sure he is doing well.  Admitted today for interval appendectomy.  Past Medical History   Diagnosis  Date   .  Acute appendicitis with rupture     History reviewed. No pertinent past surgical history.  Current Outpatient Prescriptions   Medication  Sig  Dispense  Refill   .  HYDROcodone-acetaminophen (LORTAB) 7.5-500 MG per tablet  Take 1 tablet by mouth every 4 (four) hours as needed.  30 tablet  0   .  levofloxacin (LEVAQUIN) 750 MG tablet  Take 1 tablet (750 mg total) by mouth daily.  14 tablet  0   .  metroNIDAZOLE (FLAGYL) 500 MG tablet  Take 1 tablet (500 mg total) by mouth 3 (three) times daily.  42 tablet  0    Review of patient's allergies indicates no known allergies.  Family History   Problem  Relation  Age of Onset   .  Lung cancer      Social History: reports that he has  never smoked. He does not have any smokeless tobacco history on file. He reports that he does not drink alcohol or use illicit drugs.  REVIEW OF SYSTEMS - PERTINENT POSITIVES ONLY:  neg  Physical Exam:  Blood pressure 132/76, pulse 74, temperature 97.8 F (36.6 C), temperature source Temporal, resp. rate 16, height 5' 8"  (1.727 m), weight 188 lb 4 oz (85.39 kg).  Body mass index is 28.62 kg/(m^2).  Gen: WDWN WM NAD  Neurological: Alert and oriented to person, place, and time. Motor and sensory function is grossly intact  Head: Normocephalic and atraumatic.  Eyes: Conjunctivae are normal. Pupils are equal, round, and reactive to light. No scleral icterus.  Neck: Normal range of motion. Neck supple. No tracheal deviation or thyromegaly present.  Cardiovascular: SR without murmurs or gallops. No carotid bruits  Respiratory: Effort normal. No respiratory distress. No chest wall tenderness. Breath sounds normal. No wheezes, rales or rhonchi.  Abdomen: No rebound or guarding. nontender  GU:  Musculoskeletal: Normal range of motion. Extremities are nontender. No cyanosis, edema or clubbing noted Lymphadenopathy: No cervical, preauricular, postauricular or axillary adenopathy is present Skin: Skin is warm and dry. No rash noted. No diaphoresis. No erythema. No pallor. Pscyh: Normal mood and affect. Behavior is normal. Judgment and thought content  normal.  LABORATORY RESULTS:  No results found for this or any previous visit (from the past 48 hour(s)).  RADIOLOGY RESULTS:  No results found.  Problem List:  Patient Active Problem List   Diagnosis   .  Ruptured appendicitis-nonop management begun Sept 2013    Assessment & Plan:  Appendiceal abscess/phlegmon. Has been treated with antibiotics and observation.  Now for lap/open appy. Matt B. Hassell Done, MD, [facility]  [phone] beeper  [phone]

## 2012-10-15 ENCOUNTER — Telehealth (INDEPENDENT_AMBULATORY_CARE_PROVIDER_SITE_OTHER): Payer: Self-pay | Admitting: General Surgery

## 2012-10-15 ENCOUNTER — Encounter (HOSPITAL_COMMUNITY): Payer: Self-pay | Admitting: Surgery

## 2012-10-15 NOTE — Telephone Encounter (Signed)
Pt called to report hydrocodone (5/325 mg) is not strong enough.  Reassured pt and suggested he also try using an ice pack to the site.  Can take 2 pills at once; can use ibuprofen or aleve along with the narcotic.  Don't over do exercise at home---no pulling pushing, lifting or carrying anything over 10 lbs for at least a week.  Also spoke with pt's mother.

## 2012-10-16 ENCOUNTER — Inpatient Hospital Stay (HOSPITAL_COMMUNITY)
Admission: EM | Admit: 2012-10-16 | Discharge: 2012-10-25 | DRG: 553 | Disposition: A | Discharge status: Home / Self Care | Payer: BC Managed Care – PPO | Attending: Surgery | Admitting: Surgery

## 2012-10-16 ENCOUNTER — Ambulatory Visit (HOSPITAL_COMMUNITY): Payer: BC Managed Care – PPO

## 2012-10-16 ENCOUNTER — Telehealth (INDEPENDENT_AMBULATORY_CARE_PROVIDER_SITE_OTHER): Payer: Self-pay

## 2012-10-16 ENCOUNTER — Encounter (HOSPITAL_COMMUNITY)
Admission: EM | Disposition: A | Discharge status: Home / Self Care | Payer: Self-pay | Source: Home / Self Care | Attending: Surgery

## 2012-10-16 ENCOUNTER — Emergency Department (HOSPITAL_COMMUNITY): Payer: BC Managed Care – PPO

## 2012-10-16 ENCOUNTER — Observation Stay (HOSPITAL_COMMUNITY): Payer: BC Managed Care – PPO | Admitting: Anesthesiology

## 2012-10-16 ENCOUNTER — Encounter (HOSPITAL_COMMUNITY): Payer: Self-pay | Admitting: *Deleted

## 2012-10-16 ENCOUNTER — Encounter (HOSPITAL_COMMUNITY): Payer: Self-pay | Admitting: Anesthesiology

## 2012-10-16 DIAGNOSIS — E669 Obesity, unspecified: Secondary | ICD-10-CM | POA: Diagnosis present

## 2012-10-16 DIAGNOSIS — K632 Fistula of intestine: Principal | ICD-10-CM | POA: Diagnosis not present

## 2012-10-16 DIAGNOSIS — K650 Generalized (acute) peritonitis: Secondary | ICD-10-CM

## 2012-10-16 DIAGNOSIS — F909 Attention-deficit hyperactivity disorder, unspecified type: Secondary | ICD-10-CM | POA: Diagnosis present

## 2012-10-16 DIAGNOSIS — G8918 Other acute postprocedural pain: Secondary | ICD-10-CM

## 2012-10-16 DIAGNOSIS — K659 Peritonitis, unspecified: Secondary | ICD-10-CM | POA: Diagnosis present

## 2012-10-16 DIAGNOSIS — Z9089 Acquired absence of other organs: Secondary | ICD-10-CM

## 2012-10-16 DIAGNOSIS — Z9049 Acquired absence of other specified parts of digestive tract: Secondary | ICD-10-CM

## 2012-10-16 DIAGNOSIS — R109 Unspecified abdominal pain: Secondary | ICD-10-CM

## 2012-10-16 DIAGNOSIS — D72829 Elevated white blood cell count, unspecified: Secondary | ICD-10-CM

## 2012-10-16 DIAGNOSIS — Z6830 Body mass index (BMI) 30.0-30.9, adult: Secondary | ICD-10-CM

## 2012-10-16 DIAGNOSIS — K3532 Acute appendicitis with perforation and localized peritonitis, without abscess: Secondary | ICD-10-CM | POA: Diagnosis present

## 2012-10-16 DIAGNOSIS — R509 Fever, unspecified: Secondary | ICD-10-CM

## 2012-10-16 DIAGNOSIS — Z79899 Other long term (current) drug therapy: Secondary | ICD-10-CM

## 2012-10-16 HISTORY — PX: LAPAROSCOPY: SHX197

## 2012-10-16 LAB — CBC
HCT: 36.9 % — ABNORMAL LOW (ref 39.0–52.0)
HCT: 40.3 % (ref 39.0–52.0)
Hemoglobin: 12.5 g/dL — ABNORMAL LOW (ref 13.0–17.0)
MCH: 27.2 pg (ref 26.0–34.0)
MCHC: 33.3 g/dL (ref 30.0–36.0)
MCV: 80.2 fL (ref 78.0–100.0)
Platelets: 293 10*3/uL (ref 150–400)
RBC: 4.6 MIL/uL (ref 4.22–5.81)
RDW: 14.8 % (ref 11.5–15.5)
WBC: 15.6 10*3/uL — ABNORMAL HIGH (ref 4.0–10.5)

## 2012-10-16 LAB — COMPREHENSIVE METABOLIC PANEL
AST: 25 U/L (ref 0–37)
BUN: 12 mg/dL (ref 6–23)
CO2: 24 mEq/L (ref 19–32)
Calcium: 8.3 mg/dL — ABNORMAL LOW (ref 8.4–10.5)
Chloride: 104 mEq/L (ref 96–112)
Creatinine, Ser: 0.76 mg/dL (ref 0.50–1.35)
GFR calc Af Amer: >90 mL/min (ref >90)
GFR calc non Af Amer: >90 mL/min (ref >90)
Glucose, Bld: 110 mg/dL — ABNORMAL HIGH (ref 70–99)
Total Bilirubin: 0.4 mg/dL (ref 0.3–1.2)

## 2012-10-16 LAB — URINALYSIS, ROUTINE W REFLEX MICROSCOPIC
Hgb urine dipstick: NEGATIVE
Nitrite: NEGATIVE
Protein, ur: NEGATIVE mg/dL
Specific Gravity, Urine: 1.022 (ref 1.005–1.030)
Urobilinogen, UA: 0.2 mg/dL (ref 0.0–1.0)

## 2012-10-16 LAB — CREATININE, SERUM
Creatinine, Ser: 0.86 mg/dL (ref 0.50–1.35)
GFR calc non Af Amer: >90 mL/min (ref >90)

## 2012-10-16 SURGERY — LAPAROSCOPY, DIAGNOSTIC
Anesthesia: General | Wound class: Contaminated

## 2012-10-16 MED ORDER — PIPERACILLIN-TAZOBACTAM 3.375 G IVPB
3.3750 g | Freq: Three times a day (TID) | INTRAVENOUS | Status: DC
  Filled 2012-10-16: qty 50

## 2012-10-16 MED ORDER — ONDANSETRON HCL 4 MG/2ML IJ SOLN
INTRAMUSCULAR | Status: DC | PRN
  Administered 2012-10-16 (×2): 2 mg via INTRAVENOUS

## 2012-10-16 MED ORDER — ACETAMINOPHEN 325 MG PO TABS
650.0000 mg | ORAL_TABLET | Freq: Four times a day (QID) | ORAL | Status: DC | PRN
  Administered 2012-10-16: 650 mg via ORAL

## 2012-10-16 MED ORDER — IOHEXOL 300 MG/ML  SOLN
100.0000 mL | Freq: Once | INTRAMUSCULAR | Status: AC | PRN
  Administered 2012-10-16: 100 mL via INTRAVENOUS

## 2012-10-16 MED ORDER — DEXAMETHASONE SODIUM PHOSPHATE 4 MG/ML IJ SOLN
INTRAMUSCULAR | Status: DC | PRN
  Administered 2012-10-16: 10 mg via INTRAVENOUS

## 2012-10-16 MED ORDER — GLYCOPYRROLATE 0.2 MG/ML IJ SOLN
INTRAMUSCULAR | Status: DC | PRN
  Administered 2012-10-16: .7 mg via INTRAVENOUS

## 2012-10-16 MED ORDER — HYDROMORPHONE HCL PF 1 MG/ML IJ SOLN
0.2500 mg | INTRAMUSCULAR | Status: DC | PRN
  Administered 2012-10-16 (×4): 0.5 mg via INTRAVENOUS

## 2012-10-16 MED ORDER — ACETAMINOPHEN 650 MG RE SUPP
650.0000 mg | Freq: Four times a day (QID) | RECTAL | Status: DC | PRN
  Filled 2012-10-16: qty 1

## 2012-10-16 MED ORDER — PROMETHAZINE HCL 25 MG/ML IJ SOLN: 6.2500 mg | INTRAMUSCULAR | Status: DC | PRN

## 2012-10-16 MED ORDER — ONDANSETRON HCL 4 MG/2ML IJ SOLN: 4.0000 mg | Freq: Four times a day (QID) | INTRAMUSCULAR | Status: DC | PRN

## 2012-10-16 MED ORDER — HYDROMORPHONE HCL PF 1 MG/ML IJ SOLN
INTRAMUSCULAR | Status: DC | PRN
  Administered 2012-10-16 (×2): 1 mg via INTRAVENOUS

## 2012-10-16 MED ORDER — MORPHINE SULFATE 4 MG/ML IJ SOLN
6.0000 mg | Freq: Once | INTRAMUSCULAR | Status: AC
  Administered 2012-10-16: 6 mg via INTRAVENOUS
  Filled 2012-10-16: qty 2

## 2012-10-16 MED ORDER — ONDANSETRON HCL 4 MG/2ML IJ SOLN
4.0000 mg | INTRAMUSCULAR | Status: AC
  Administered 2012-10-16: 4 mg via INTRAVENOUS
  Filled 2012-10-16: qty 2

## 2012-10-16 MED ORDER — NEOSTIGMINE METHYLSULFATE 1 MG/ML IJ SOLN
INTRAMUSCULAR | Status: DC | PRN
  Administered 2012-10-16: 4 mg via INTRAVENOUS

## 2012-10-16 MED ORDER — KETOROLAC TROMETHAMINE 30 MG/ML IJ SOLN
15.0000 mg | Freq: Once | INTRAMUSCULAR | Status: AC | PRN
  Administered 2012-10-16: 30 mg via INTRAVENOUS

## 2012-10-16 MED ORDER — LACTATED RINGERS IV SOLN: INTRAVENOUS | Status: DC

## 2012-10-16 MED ORDER — DEXTROSE-NACL 5-0.9 % IV SOLN: INTRAVENOUS | Status: DC

## 2012-10-16 MED ORDER — PIPERACILLIN-TAZOBACTAM 3.375 G IVPB
3.3750 g | Freq: Three times a day (TID) | INTRAVENOUS | Status: DC
  Filled 2012-10-16 (×2): qty 50

## 2012-10-16 MED ORDER — HEPARIN SODIUM (PORCINE) 5000 UNIT/ML IJ SOLN
5000.0000 [IU] | Freq: Three times a day (TID) | INTRAMUSCULAR | Status: DC
  Administered 2012-10-16: 5000 [IU] via SUBCUTANEOUS
  Filled 2012-10-16 (×3): qty 1

## 2012-10-16 MED ORDER — HYDROMORPHONE HCL PF 1 MG/ML IJ SOLN
1.0000 mg | Freq: Once | INTRAMUSCULAR | Status: AC
  Administered 2012-10-16: 1 mg via INTRAVENOUS
  Filled 2012-10-16: qty 1

## 2012-10-16 MED ORDER — FENTANYL CITRATE 0.05 MG/ML IJ SOLN
INTRAMUSCULAR | Status: DC | PRN
  Administered 2012-10-16 (×7): 50 ug via INTRAVENOUS

## 2012-10-16 MED ORDER — DIPHENHYDRAMINE HCL 50 MG/ML IJ SOLN: 12.5000 mg | Freq: Four times a day (QID) | INTRAMUSCULAR | Status: DC | PRN

## 2012-10-16 MED ORDER — LACTATED RINGERS IR SOLN
Status: DC | PRN
  Administered 2012-10-16: 3000 mL

## 2012-10-16 MED ORDER — BUPIVACAINE LIPOSOME 1.3 % IJ SUSP
INTRAMUSCULAR | Status: DC | PRN
  Administered 2012-10-16: 20 mL

## 2012-10-16 MED ORDER — BUPIVACAINE LIPOSOME 1.3 % IJ SUSP
20.0000 mL | Freq: Once | INTRAMUSCULAR | Status: DC
  Filled 2012-10-16: qty 20

## 2012-10-16 MED ORDER — DIPHENHYDRAMINE HCL 12.5 MG/5ML PO ELIX: 12.5000 mg | ORAL_SOLUTION | Freq: Four times a day (QID) | ORAL | Status: DC | PRN

## 2012-10-16 MED ORDER — LACTATED RINGERS IV SOLN
INTRAVENOUS | Status: DC | PRN
  Administered 2012-10-16 (×2): via INTRAVENOUS

## 2012-10-16 MED ORDER — HEPARIN SODIUM (PORCINE) 5000 UNIT/ML IJ SOLN
5000.0000 [IU] | Freq: Three times a day (TID) | INTRAMUSCULAR | Status: DC
  Administered 2012-10-16 – 2012-10-24 (×23): 5000 [IU] via SUBCUTANEOUS
  Filled 2012-10-16 (×30): qty 1

## 2012-10-16 MED ORDER — HYDROMORPHONE HCL PF 1 MG/ML IJ SOLN
INTRAMUSCULAR | Status: AC
  Filled 2012-10-16: qty 1

## 2012-10-16 MED ORDER — KETOROLAC TROMETHAMINE 30 MG/ML IJ SOLN
INTRAMUSCULAR | Status: AC
  Filled 2012-10-16: qty 1

## 2012-10-16 MED ORDER — CISATRACURIUM BESYLATE (PF) 10 MG/5ML IV SOLN
INTRAVENOUS | Status: DC | PRN
  Administered 2012-10-16: 8 mg via INTRAVENOUS
  Administered 2012-10-16: 2 mg via INTRAVENOUS

## 2012-10-16 MED ORDER — KCL IN DEXTROSE-NACL 20-5-0.45 MEQ/L-%-% IV SOLN
INTRAVENOUS | Status: AC
  Administered 2012-10-16 – 2012-10-17 (×2): via INTRAVENOUS
  Administered 2012-10-17 – 2012-10-18 (×2): 100 mL/h via INTRAVENOUS
  Administered 2012-10-19 (×2): via INTRAVENOUS
  Filled 2012-10-16 (×10): qty 1000

## 2012-10-16 MED ORDER — MORPHINE SULFATE 2 MG/ML IJ SOLN
2.0000 mg | INTRAMUSCULAR | Status: DC | PRN
  Administered 2012-10-16: 2 mg via INTRAVENOUS
  Filled 2012-10-16: qty 1

## 2012-10-16 MED ORDER — HYDROMORPHONE HCL PF 1 MG/ML IJ SOLN
0.5000 mg | INTRAMUSCULAR | Status: DC | PRN
  Administered 2012-10-16: 1.5 mg via INTRAVENOUS
  Administered 2012-10-16 (×2): 1 mg via INTRAVENOUS
  Filled 2012-10-16: qty 2
  Filled 2012-10-16 (×2): qty 1

## 2012-10-16 MED ORDER — KCL IN DEXTROSE-NACL 40-5-0.9 MEQ/L-%-% IV SOLN
INTRAVENOUS | Status: DC
  Administered 2012-10-16 (×2): via INTRAVENOUS
  Filled 2012-10-16 (×5): qty 1000

## 2012-10-16 MED ORDER — ENOXAPARIN SODIUM 30 MG/0.3ML ~~LOC~~ SOLN: 30.0000 mg | Freq: Two times a day (BID) | SUBCUTANEOUS | Status: DC

## 2012-10-16 MED ORDER — PANTOPRAZOLE SODIUM 40 MG IV SOLR
40.0000 mg | Freq: Every day | INTRAVENOUS | Status: DC
  Filled 2012-10-16: qty 40

## 2012-10-16 MED ORDER — ACETAMINOPHEN 10 MG/ML IV SOLN
INTRAVENOUS | Status: AC
  Filled 2012-10-16: qty 100

## 2012-10-16 MED ORDER — PIPERACILLIN-TAZOBACTAM 3.375 G IVPB 30 MIN
3.3750 g | INTRAVENOUS | Status: AC
  Administered 2012-10-16: 3.375 g via INTRAVENOUS
  Filled 2012-10-16 (×2): qty 50

## 2012-10-16 SURGICAL SUPPLY — 45 items
APL SKNCLS STERI-STRIP NONHPOA (GAUZE/BANDAGES/DRESSINGS) ×1
BENZOIN TINCTURE PRP APPL 2/3 (GAUZE/BANDAGES/DRESSINGS) ×1 IMPLANT
CANISTER SUCTION 2500CC (MISCELLANEOUS) ×2 IMPLANT
CLOTH BEACON ORANGE TIMEOUT ST (SAFETY) ×2 IMPLANT
COVER SURGICAL LIGHT HANDLE (MISCELLANEOUS) ×1 IMPLANT
DECANTER SPIKE VIAL GLASS SM (MISCELLANEOUS) IMPLANT
DRAIN CHANNEL 19F RND (DRAIN) ×1 IMPLANT
DRAIN CHANNEL RND F F (WOUND CARE) ×1 IMPLANT
DRAPE LAPAROSCOPIC ABDOMINAL (DRAPES) ×2 IMPLANT
DRSG TEGADERM 2-3/8X2-3/4 SM (GAUZE/BANDAGES/DRESSINGS) ×3 IMPLANT
DRSG TEGADERM 4X4.75 (GAUZE/BANDAGES/DRESSINGS) ×1 IMPLANT
ELECT REM PT RETURN 9FT ADLT (ELECTROSURGICAL) ×2
ELECTRODE REM PT RTRN 9FT ADLT (ELECTROSURGICAL) ×1 IMPLANT
EVACUATOR SILICONE 100CC (DRAIN) ×1 IMPLANT
GAUZE SPONGE 2X2 8PLY STRL LF (GAUZE/BANDAGES/DRESSINGS) IMPLANT
GLOVE BIOGEL M 8.0 STRL (GLOVE) ×2 IMPLANT
GLOVE BIOGEL PI IND STRL 7.0 (GLOVE) ×1 IMPLANT
GLOVE BIOGEL PI INDICATOR 7.0 (GLOVE) ×1
GOWN STRL NON-REIN LRG LVL3 (GOWN DISPOSABLE) ×1 IMPLANT
GOWN STRL REIN XL XLG (GOWN DISPOSABLE) ×4 IMPLANT
HAND ACTIVATED (MISCELLANEOUS) IMPLANT
KIT BASIN OR (CUSTOM PROCEDURE TRAY) ×2 IMPLANT
SCRUB PCMX 4 OZ (MISCELLANEOUS) ×1 IMPLANT
SET IRRIG TUBING LAPAROSCOPIC (IRRIGATION / IRRIGATOR) ×1 IMPLANT
SLEEVE Z-THREAD 5X100MM (TROCAR) IMPLANT
SOLUTION ANTI FOG 6CC (MISCELLANEOUS) ×2 IMPLANT
SPONGE GAUZE 2X2 STER 10/PKG (GAUZE/BANDAGES/DRESSINGS) ×1
SPONGE GAUZE 4X4 12PLY (GAUZE/BANDAGES/DRESSINGS) ×1 IMPLANT
STRIP CLOSURE SKIN 1/2X4 (GAUZE/BANDAGES/DRESSINGS) ×1 IMPLANT
SUT ETHILON 3 0 PS 1 (SUTURE) ×1 IMPLANT
SUT VIC AB 4-0 SH 18 (SUTURE) ×1 IMPLANT
SUT VICRYL 0 UR6 27IN ABS (SUTURE) ×2 IMPLANT
SWAB COLLECTION DEVICE MRSA (MISCELLANEOUS) ×1 IMPLANT
SYR 30ML LL (SYRINGE) ×1 IMPLANT
TRAY FOLEY CATH 14FRSI W/METER (CATHETERS) ×1 IMPLANT
TRAY LAP CHOLE (CUSTOM PROCEDURE TRAY) ×2 IMPLANT
TROCAR BLADELESS OPT 5 75 (ENDOMECHANICALS) ×1 IMPLANT
TROCAR HASSON GELL 12X100 (TROCAR) IMPLANT
TROCAR XCEL BLUNT TIP 100MML (ENDOMECHANICALS) ×1 IMPLANT
TROCAR XCEL NON-BLD 11X100MML (ENDOMECHANICALS) ×1 IMPLANT
TROCAR Z-THREAD FIOS 11X100 BL (TROCAR) IMPLANT
TROCAR Z-THREAD FIOS 5X100MM (TROCAR) IMPLANT
TROCAR Z-THREAD SLEEVE 11X100 (TROCAR) IMPLANT
TUBE ANAEROBIC SPECIMEN COL (MISCELLANEOUS) ×1 IMPLANT
TUBING INSUFFLATION 10FT LAP (TUBING) ×2 IMPLANT

## 2012-10-16 NOTE — Transfer of Care (Signed)
Immediate Anesthesia Transfer of Care Note  Patient: Jacob [name]  Procedure(s) Performed: Procedure(s) (LRB): LAPAROSCOPY DIAGNOSTIC (N/A)  Patient Location: PACU  Anesthesia Type: General  Level of Consciousness: sedated, patient cooperative and responds to stimulaton  Airway & Oxygen Therapy: Patient Spontanous Breathing and Patient connected to face mask oxgen  Post-op Assessment: Report given to PACU RN and Post -op Vital signs reviewed and stable  Post vital signs: Reviewed and stable  Complications: No apparent anesthesia complications

## 2012-10-16 NOTE — ED Notes (Signed)
Patient transported to CT 

## 2012-10-16 NOTE — Progress Notes (Signed)
ANTIBIOTIC CONSULT NOTE - INITIAL  Pharmacy Consult for Zosyn Indication: Intra-abdominal infection  No Known Allergies  Patient Measurements:   Wt 90.7, Ht 173 cm on 10/14/12  Vital Signs: Temp: 97.6 F (36.4 C) (12/05 0928) Temp src: Oral (12/05 0928) BP: 131/74 mmHg (12/05 0928) Pulse Rate: 99  (12/05 1200)    Labs:  Basename 10/16/12 1002 10/14/12 1206  WBC 15.6* 13.7*  HGB 12.5* 13.9  PLT 279 338  LABCREA -- --  CREATININE 0.76 0.77   The CrCl is unknown because both a height and weight (above a minimum accepted value) are required for this calculation. Estimated CrCl > 100 mL/min     Microbiology: Recent Results (from the past 720 hour(s))  SURGICAL PCR SCREEN     Status: Abnormal   Collection Time   10/13/12 11:52 AM      Component Value Range Status Comment   MRSA, PCR NEGATIVE  NEGATIVE Final    Staphylococcus aureus POSITIVE (*) NEGATIVE Final     Medical History: Past Medical History  Diagnosis Date  . Acute appendicitis with rupture     Medications:  Scheduled:    . enoxaparin  30 mg Subcutaneous Q12H  . heparin  5,000 Units Subcutaneous Q8H  . [COMPLETED]  HYDROmorphone (DILAUDID) injection  1 mg Intravenous Once  . [COMPLETED]  HYDROmorphone (DILAUDID) injection  1 mg Intravenous Once  . [COMPLETED]  morphine injection  6 mg Intravenous Once  . [COMPLETED] ondansetron (ZOFRAN) IV  4 mg Intravenous STAT  . pantoprazole (PROTONIX) IV  40 mg Intravenous QHS   Infusions:    . dextrose 5 % and 0.9 % NaCl with KCl 40 mEq/L    . [DISCONTINUED] dextrose 5 %-0.9% NaCl with KCl Pediatric custom IV fluid     PRN: acetaminophen, acetaminophen, diphenhydrAMINE, diphenhydrAMINE, HYDROmorphone (DILAUDID) injection, [COMPLETED] iohexol, ondansetron  Assessment:  21 y/o M with ruptured appendicitis 07/16/12 treated in [place], s/p laparoscopic appendectomy 10/14/12 at [facility], now with abdominal pain, leukocytosis, and CT findings suggestive of possible RLQ  infection.  To begin empiric antibiotic therapy using Zosyn.  Goal of Therapy:  Eradication of infection Adjust Zosyn dosage for estimated renal function  Plan:  1. Zosyn 3.375 grams IV x 1 now over 30 mins in ED, then 3.375 grams IV q8h by extended infusion (each dose over 4 hours). 2. Follow hospital course.  Clayburn Pert, PharmD, BCPS Pager: [phone] 10/16/2012  1:47 PM

## 2012-10-16 NOTE — H&P (Addendum)
Jacob [name] is an 21 y.o. male.   Chief Complaint: Abdominal pain HPI: Patient is a 21 year old male who was admitted on 10/14/2012 for elective appendectomy. He previously presented with a ruptured appendicitis in September in [place], [place]. 9/4 through 07/20/2012. He was maintained postoperative oral Levaquin and Flagyl for 2 weeks. He did well postop and went home the day of surgery 10/14/12. He did well he today 10/15/12. He woke early around 3 AM with significant pain had a bowel movement which was his second since his surgery. He said it was so painful he couldn't walk back upstairs and laid down on the couch downstairs. He woke up later with ongoing 10 over 10 pain was brought to the emergency room by EMS. Plain films in the ER showed no free air. CT scan shows free intraperitoneal air most likely secondary to his surgery. There is moderate inflammation of the right lower quadrant with small foci of mesenteric loop of fluid and a small collection of well-defined borders which could represent phlegmon or tiny early abscess. She also had a small amount of fluid the pelvic cul-de-sac. There is no abscess, and no drainable fluid collection. WBC was 15,600, potassium was low, other labs were normal.  Past Medical History  Diagnosis Date  . Acute appendicitis with rupture     Past Surgical History  Procedure Date  . Cyst removed 15 years ago    Right chest  . Splinter removal 15  years ago    splinter removed, bad infection, surgery to remove fragments  . Laparoscopic appendectomy 10/14/2012    Procedure: APPENDECTOMY LAPAROSCOPIC;  Surgeon: Pedro Earls, MD;  Location: [facility];  Service: General;  Laterality: N/A;    Family History  Problem Relation Age of Onset  . Lung cancer     Social History:  reports that he has never smoked. He has never used smokeless tobacco. He reports that he drinks alcohol. He reports that he does not use illicit drugs.  Allergies: No Known  Allergies   (Not in a hospital admission)  Results for orders placed during the hospital encounter of 10/16/12 (from the past 48 hour(s))  CBC     Status: Abnormal   Collection Time   10/16/12 10:02 AM      Component Value Range Comment   WBC 15.6 (*) 4.0 - 10.5 K/uL    RBC 4.60  4.22 - 5.81 MIL/uL    Hemoglobin 12.5 (*) 13.0 - 17.0 g/dL    HCT 36.9 (*) 39.0 - 52.0 %    MCV 80.2  78.0 - 100.0 fL    MCH 27.2  26.0 - 34.0 pg    MCHC 33.9  30.0 - 36.0 g/dL    RDW 14.8  11.5 - 15.5 %    Platelets 279  150 - 400 K/uL   COMPREHENSIVE METABOLIC PANEL     Status: Abnormal   Collection Time   10/16/12 10:02 AM      Component Value Range Comment   Sodium 136  135 - 145 mEq/L    Potassium 3.5  3.5 - 5.1 mEq/L    Chloride 104  96 - 112 mEq/L    CO2 24  19 - 32 mEq/L    Glucose, Bld 110 (*) 70 - 99 mg/dL    BUN 12  6 - 23 mg/dL    Creatinine, Ser 0.76  0.50 - 1.35 mg/dL    Calcium 8.3 (*) 8.4 - 10.5 mg/dL    Total Protein  6.0  6.0 - 8.3 g/dL    Albumin 3.1 (*) 3.5 - 5.2 g/dL    AST 25  0 - 37 U/L    ALT 39  0 - 53 U/L    Alkaline Phosphatase 89  39 - 117 U/L    Total Bilirubin 0.4  0.3 - 1.2 mg/dL    GFR calc non Af Amer >90  >90 mL/min    GFR calc Af Amer >90  >90 mL/min   URINALYSIS, ROUTINE W REFLEX MICROSCOPIC     Status: Normal   Collection Time   10/16/12 12:20 PM      Component Value Range Comment   Color, Urine YELLOW  YELLOW    APPearance CLEAR  CLEAR    Specific Gravity, Urine 1.022  1.005 - 1.030    pH 7.0  5.0 - 8.0    Glucose, UA NEGATIVE  NEGATIVE mg/dL    Hgb urine dipstick NEGATIVE  NEGATIVE    Bilirubin Urine NEGATIVE  NEGATIVE    Ketones, ur NEGATIVE  NEGATIVE mg/dL    Protein, ur NEGATIVE  NEGATIVE mg/dL    Urobilinogen, UA 0.2  0.0 - 1.0 mg/dL    Nitrite NEGATIVE  NEGATIVE    Leukocytes, UA NEGATIVE  NEGATIVE MICROSCOPIC NOT DONE ON URINES WITH NEGATIVE PROTEIN, BLOOD, LEUKOCYTES, NITRITE, OR GLUCOSE <1000 mg/dL.   Dg Abd 1 View  10/16/2012  *RADIOLOGY  REPORT*  Clinical Data: Appendectomy 2 days ago, now with severe right-sided pain  ABDOMEN - 1 VIEW  Comparison: CT abdomen pelvis of 08/05/2012  Findings: A left lateral decubitus view of the abdomen was performed.  No free intraperitoneal air is seen.  There is some small bowel gas present most likely reflecting ileus.  No definite obstruction is noted.  IMPRESSION: No free air.  Question mild ileus.   Original Report Authenticated By: Ivar Drape, M.D.    Ct Abdomen Pelvis W Contrast  10/16/2012  *RADIOLOGY REPORT*  Clinical Data: Severe right lower quadrant pain, status post appendectomy 2 days ago.  Rule out abscess.  CT ABDOMEN AND PELVIS WITH CONTRAST  Technique:  Multidetector CT imaging of the abdomen and pelvis was performed following the standard protocol during bolus administration of intravenous contrast.  Contrast: 157m OMNIPAQUE IOHEXOL 300 MG/ML  SOLN  Comparison: Plain film of earlier today.  CT of 08/05/2012.  Findings: Lung bases:  Clear lung bases.  Normal heart size without pericardial or pleural effusion.  Abdomen/pelvis:  Moderate volume free intraperitoneal air, likely postoperative.  Normal liver, spleen, stomach, pancreas, gallbladder, biliary tract, adrenal glands, kidneys.  No retroperitoneal or retrocrural adenopathy.  Normal colon and terminal ileum.  There is moderate edema centered about the operative bed, status post appendectomy.  Small locules of mesenteric interloop fluid are identified with ill-defined hyperenhancement.  The most well-defined focus measures 2.1 x 1.6 cm on image 66/series 2 and coronal image 37/series 3.  Small bowel loops are normal in caliber.  There is likely mild secondary small bowel wall thickening in the region of postoperative inflammation.  Mildly prominent ileocolic mesenteric nodes are improved since the prior exam and likely reactive.  No pelvic adenopathy.    Normal urinary bladder and prostate. Small volume cul-de-sac fluid, without collection or  abnormal peritoneal enhancement.  Image 79/series 2.  Bones/Musculoskeletal:  No acute osseous abnormality.  IMPRESSION:  1.  Status post appendectomy 2 days ago.  Free intraperitoneal air, which is likely secondary. 2. Moderate inflammation in the right lower quadrant with small  foci of mesenteric interloop fluid. A small collection without well- defined borders could represent phlegmon or early tiny abscess.  If symptoms persist, short-term follow-up CT should be considered. 3.  Mild inflammation of distal small bowel loops, likely secondary. 4.  Small volume pelvic cul-de-sac fluid, without cul-de-sac abscess.   Original Report Authenticated By: Abigail Miyamoto, M.D.     Review of Systems  Constitutional: Positive for fever and chills. Negative for weight loss, malaise/fatigue and diaphoresis.  HENT: Negative.   Eyes: Negative.   Respiratory: Negative.   Cardiovascular: Negative.   Gastrointestinal: Positive for heartburn (occasional), nausea (some before coming to ER), abdominal pain, constipation (Got an enema on 10/14/12 before his surgery, constipated ever since.) and blood in stool (Not yesterday, but does have occasional blood on tissue with BM). Negative for vomiting and diarrhea.  Genitourinary: Positive for dysuria (since this AM).  Musculoskeletal: Negative.   Skin: Negative.   Neurological: Negative.  Negative for weakness.  Endo/Heme/Allergies: Negative.   Psychiatric/Behavioral: Negative.     Blood pressure 131/74, pulse 99, temperature 97.6 F (36.4 C), temperature source Oral, resp. rate 19, SpO2 98.00%. Physical Exam  Constitutional: He is oriented to person, place, and time. He appears well-developed and well-nourished. He appears distressed.       Temp up in the ER to 99.9 now.  This is new, it was 97.6 on admit.   HENT:  Head: Normocephalic and atraumatic.  Nose: Nose normal.  Eyes: Conjunctivae normal and EOM are normal. Pupils are equal, round, and reactive to light.  Right eye exhibits no discharge. Left eye exhibits no discharge. No scleral icterus.  Neck: Normal range of motion. Neck supple. No JVD present. No tracheal deviation present. No thyromegaly present.  Cardiovascular: Normal rate, regular rhythm, normal heart sounds and intact distal pulses.  Exam reveals no gallop.   No murmur heard. Respiratory: Effort normal and breath sounds normal. No stridor. No respiratory distress. He has no wheezes. He has no rales. He exhibits no tenderness.  GI: He exhibits distension. He exhibits no mass. There is tenderness. There is guarding. There is no rebound (pain is mostly in the RLQ, but he's tender all over on initial exam with light touch.  he was able to get up and void later, it hurt to stand and he put all his weight on the left).  Musculoskeletal: He exhibits no edema and no tenderness.  Lymphadenopathy:    He has no cervical adenopathy.  Neurological: He is alert and oriented to person, place, and time. No cranial nerve deficit.  Skin: Skin is warm and dry. No rash noted. He is not diaphoretic. No erythema. No pallor.  Psychiatric: He has a normal mood and affect. His behavior is normal. Judgment and thought content normal.     Assessment/Plan 1. Status post appendectomy. 10/14/12. Status post ruptured appendicitis with phlegmon and antibiotics 07/16/2012. 2. Postop pain with right lower quadrant fluid collection, concerning for possible early abscess.  Plan: We will admit the patient and placed on IV antibiotics bowel rest. Further workup and evaluation as needed.  This is will [place] PA for Dr. Johnathan Hausen .                                                                                                                                                                                                                                                                                                                          Marland Kitchen  Norberta Stobaugh 10/16/2012,  1:10 PM

## 2012-10-16 NOTE — ED Notes (Signed)
Patient transported to X-ray 

## 2012-10-16 NOTE — ED Provider Notes (Signed)
History     CSN: [id number]  Arrival date & time 10/16/12  0915   First MD Initiated Contact with Patient 10/16/12 [phone]      Chief Complaint  Patient presents with  . Abdominal Pain    (Consider location/radiation/quality/duration/timing/severity/associated sxs/prior treatment) HPI Comments: Jacob [name] presents with his parents for evaluation.  He was diagnosed with an acute appendicitis over a month ago but secondary to the amount of localized inflammation it was decided that he would be admitted to the hospital and undergo an extended course of antibiotics prior to performing an appendectomy.  He had a laparoscopic appendectomy on the 3rd and was discharged the same day as the procedure.  He denies and fevers and has tolerated po intake since going home. He also reports that he has had several bowl movements since. He awoke at 0700 with severe right lower abdominal discomfort that has since increased steadily in intensity.  Patient is a 21 y.o. male presenting with abdominal pain. The history is provided by the patient. No language interpreter was used.  Abdominal Pain The primary symptoms of the illness include abdominal pain and nausea. The primary symptoms of the illness do not include fever, fatigue, shortness of breath, vomiting, diarrhea, hematemesis, hematochezia, dysuria or vaginal discharge. The current episode started 1 to 2 hours ago. The onset of the illness was gradual. The problem has been gradually worsening.  The illness is associated with awakening from sleep. The patient states that she believes she is currently not pregnant. The patient has not had a change in bowel habit. Risk factors for an acute abdominal problem include a history of abdominal surgery. Additional symptoms associated with the illness include anorexia and back pain. Symptoms associated with the illness do not include diaphoresis, heartburn, constipation, urgency, hematuria or frequency.    Past Medical  History  Diagnosis Date  . Acute appendicitis with rupture     Past Surgical History  Procedure Date  . Cyst removed 15 years ago    Right chest  . Splinter removal 15  years ago    splinter removed, bad infection, surgery to remove fragments  . Laparoscopic appendectomy 10/14/2012    Procedure: APPENDECTOMY LAPAROSCOPIC;  Surgeon: Pedro Earls, MD;  Location: [facility];  Service: General;  Laterality: N/A;    Family History  Problem Relation Age of Onset  . Lung cancer      History  Substance Use Topics  . Smoking status: Never Smoker   . Smokeless tobacco: Never Used  . Alcohol Use: Yes     Comment: occasional      Review of Systems  Constitutional: Negative for fever, diaphoresis and fatigue.  Respiratory: Negative for shortness of breath.   Gastrointestinal: Positive for nausea, abdominal pain and anorexia. Negative for heartburn, vomiting, diarrhea, constipation, hematochezia and hematemesis.  Genitourinary: Negative for dysuria, urgency, frequency, hematuria and vaginal discharge.  Musculoskeletal: Positive for back pain.  All other systems reviewed and are negative.    Allergies  Review of patient's allergies indicates no known allergies.  Home Medications   Current Outpatient Rx  Name  Route  Sig  Dispense  Refill  . ACETAMINOPHEN 500 MG PO TABS   Oral   Take 500 mg by mouth every 6 (six) hours as needed. Pain         . AMPHETAMINE-DEXTROAMPHET ER 10 MG PO CP24   Oral   Take 10 mg by mouth daily before breakfast.         .  DM-GUAIFENESIN ER 30-600 MG PO TB12   Oral   Take 1 tablet by mouth every 12 (twelve) hours.         . OMEGA-3 FATTY ACIDS 1000 MG PO CAPS   Oral   Take 1 g by mouth daily.         Marland Kitchen HYDROCODONE-ACETAMINOPHEN 7.5-500 MG PO TABS   Oral   Take 1 tablet by mouth every 4 (four) hours as needed for pain.   30 tablet   0   . IBUPROFEN 200 MG PO TABS   Oral   Take 200 mg by mouth every 6 (six) hours as needed. Pain            BP 131/74  Pulse 85  Temp 97.6 F (36.4 C) (Oral)  Resp 20  SpO2 99%  Physical Exam  Nursing note and vitals reviewed. Constitutional: He is oriented to person, place, and time. He appears well-developed and well-nourished. He is cooperative.  Non-toxic appearance. He does not have a sickly appearance. He appears ill. No distress.  HENT:  Head: Normocephalic and atraumatic.  Right Ear: External ear normal.  Left Ear: External ear normal.  Nose: Nose normal.  Mouth/Throat: Oropharynx is clear and moist. No oropharyngeal exudate.  Eyes: Conjunctivae normal are normal. Pupils are equal, round, and reactive to light. Right eye exhibits no discharge. Left eye exhibits no discharge. No scleral icterus.  Neck: Normal range of motion. Neck supple. No JVD present. No tracheal deviation present.  Cardiovascular: Normal rate, regular rhythm, normal heart sounds and intact distal pulses.  Exam reveals no gallop and no friction rub.   No murmur heard. Pulmonary/Chest: Effort normal and breath sounds normal. No stridor. No respiratory distress. He has no wheezes. He has no rales. He exhibits no tenderness.  Abdominal: He exhibits distension. He exhibits no abdominal bruit, no ascites, no pulsatile midline mass and no mass. Bowel sounds are decreased. There is no hepatosplenomegaly. There is tenderness in the right lower quadrant. There is rigidity, rebound, guarding and tenderness at McBurney's point. There is no CVA tenderness and negative Murphy's sign. No hernia.  Musculoskeletal: Normal range of motion. He exhibits no edema and no tenderness.  Lymphadenopathy:    He has no cervical adenopathy.  Neurological: He is alert and oriented to person, place, and time.  Skin: Skin is warm and dry. No rash noted. He is not diaphoretic. No erythema. No pallor.  Psychiatric: He has a normal mood and affect. His behavior is normal.    ED Course  Procedures (including critical care  time)   Labs Reviewed  CBC  COMPREHENSIVE METABOLIC PANEL  URINALYSIS, ROUTINE W REFLEX MICROSCOPIC   No results found.   No diagnosis found.    MDM  Pt presents for evaluation of abdominal pain 2 days s/p a laparoscopic appendectomy.  He appears acutely uncomfortable, note stable VS, NAD.  Pt has abdominal rigidity, mild distention, and considerable guarding.  Will obtain basic labs and an upright film of the abdomen.  Will consult Dr. Hassell Done (general surgery) for a bedside evaluation as he is still in the perioperative period.  1230.  Pt has been seen by Gwyndolyn Saxon (the PA for Dr. Hassell Done, gen surg).  At his request a CT of the abd has been ordered the results of which are currently pending.  He has required several doses of pain medication.  Will review the results and discuss with the surgical team.  i anticipate he will be admitted. 1315.  Although  there is no specific abscess or intraabdominal pathology, he has continued pain.  He will be admitted to Dr. Earlie Server service for IV antibiotics and serial exams.     Perlie Mayo, MD 10/16/12 1330

## 2012-10-16 NOTE — Anesthesia Preprocedure Evaluation (Signed)
Anesthesia Evaluation  Patient identified by MRN, date of birth, ID band Patient awake  General Assessment Comment:S/p appy 10/14/12  Reviewed: Allergy & Precautions, H&P , NPO status , Patient's Chart, lab work & pertinent test results  Airway Mallampati: II TM Distance: >3 FB Neck ROM: Full    Dental No notable dental hx.    Pulmonary neg pulmonary ROS,  breath sounds clear to auscultation  Pulmonary exam normal       Cardiovascular negative cardio ROS  Rhythm:Regular Rate:Normal     Neuro/Psych negative neurological ROS  negative psych ROS   GI/Hepatic negative GI ROS, Neg liver ROS,   Endo/Other  negative endocrine ROS  Renal/GU negative Renal ROS  negative genitourinary   Musculoskeletal negative musculoskeletal ROS (+)   Abdominal   Peds negative pediatric ROS (+)  Hematology negative hematology ROS (+)   Anesthesia Other Findings   Reproductive/Obstetrics negative OB ROS                           Anesthesia Physical Anesthesia Plan  ASA: I and emergent  Anesthesia Plan: General   Post-op Pain Management:    Induction: Intravenous and Rapid sequence  Airway Management Planned: Oral ETT  Additional Equipment:   Intra-op Plan:   Post-operative Plan: Extubation in OR  Informed Consent: I have reviewed the patients History and Physical, chart, labs and discussed the procedure including the risks, benefits and alternatives for the proposed anesthesia with the patient or authorized representative who has indicated his/her understanding and acceptance.   Dental advisory given  Plan Discussed with: CRNA and Surgeon  Anesthesia Plan Comments:         Anesthesia Quick Evaluation

## 2012-10-16 NOTE — Telephone Encounter (Signed)
The mom called to report that the son was in so much pain after his appendectomy 15 minutes ago and he was sweating.  She couldn't hold him up so she called the EMS to come take him to [facility].  He was very nauseated too.  She wanted to let us know.  I told her I would send Dr Hassell Done a message.  He is on call today at [facility]  I paged him with the message.

## 2012-10-16 NOTE — ED Notes (Signed)
YOF:[id number]<LR> Expected date:10/16/12<BR> Expected time: 9:05 AM<BR> Means of arrival:Ambulance<BR> Comments:<BR> RLQ pain s/p appendix

## 2012-10-16 NOTE — ED Notes (Signed)
Per EMS pt complaining of RLQ abdominal pain, had appendectomy 2 days ago here, took hydrocodone at 7 am, been up since 3 am with pain, diaphoretic/pale, pain 10/10. 18G LAC NS KVO, BP 130/90 then went to 106/64, HR 80, No allergies, No hx.

## 2012-10-16 NOTE — Op Note (Signed)
Surgeon: Kaylyn Lim, MD, FACS  Asst:  none  Anes:  general  Procedure: Laparoscopy, placement of drain in pelvis  Diagnosis: 2 days post lap appendectomy with pain and fever and peritonitis  Complications: None seen.  Staple line on cecum intact  EBL:   minimal cc  Description of Procedure:  The patient was taken to [facility] on the evening of Thursday, December 5, 2 days from his interval appendectomy. At the time of his interval appendectomy a very chronically inflamed walled off appendix was removed. After endotracheal anesthesia was administered the Foley was inserted in the previously placed Dermabond was removed with the aid of finger nail polish remover. Abdomen was prepped and PCMX draped sterilely. Abdomen was entered using the [facility] port and technique. After insufflation a 2 other ports were opened and appropriate trochars placed without difficulty. A daily was seen in the pericecal region. This was not foul-smelling but was whitish. He was most intense in the area of the retroperitoneum where the appendix had been removed. The pelvis had some inflammatory exudate within that and there was one interloop. That was sucked out as well. He is a 3 L bag of irrigation to irrigate the pelvis and placement of multiple positions to retrieve year again. Carefully inspected the cecal staple line and saw no evidence of staining or frank drainage. The lateral ileum was similarly inspected and no staining or evidence of enteral contents was seen. After inspecting numerous areas and changing positions and irrigating I went ahead and placed a 19 Blake drain through his 5 mm port down into his pelvis. Closing umbilical port with interrupted 1 Vicryl an approximate the skin with 4-0 Vicryl benzoin Steri-Strips. Closure monitored with angled scope from the LLQ.    Matt B. Hassell Done, [place], [facility], [place]

## 2012-10-17 ENCOUNTER — Encounter (HOSPITAL_COMMUNITY): Payer: Self-pay | Admitting: Surgery

## 2012-10-17 LAB — COMPREHENSIVE METABOLIC PANEL
AST: 13 U/L (ref 0–37)
Albumin: 2.8 g/dL — ABNORMAL LOW (ref 3.5–5.2)
Alkaline Phosphatase: 85 U/L (ref 39–117)
Chloride: 101 mEq/L (ref 96–112)
Potassium: 4.4 mEq/L (ref 3.5–5.1)
Sodium: 134 mEq/L — ABNORMAL LOW (ref 135–145)
Total Bilirubin: 0.5 mg/dL (ref 0.3–1.2)

## 2012-10-17 LAB — SURGICAL PCR SCREEN
MRSA, PCR: NEGATIVE
Staphylococcus aureus: POSITIVE — AB

## 2012-10-17 LAB — CBC
Platelets: 284 10*3/uL (ref 150–400)
RDW: 14.9 % (ref 11.5–15.5)
WBC: 23.2 10*3/uL — ABNORMAL HIGH (ref 4.0–10.5)

## 2012-10-17 LAB — URINALYSIS, ROUTINE W REFLEX MICROSCOPIC
Glucose, UA: NEGATIVE mg/dL
Hgb urine dipstick: NEGATIVE
Ketones, ur: NEGATIVE mg/dL
Protein, ur: NEGATIVE mg/dL

## 2012-10-17 MED ORDER — OXYCODONE HCL 5 MG PO TABS
5.0000 mg | ORAL_TABLET | ORAL | Status: DC | PRN
  Administered 2012-10-17 – 2012-10-24 (×19): 10 mg via ORAL
  Administered 2012-10-24: 5 mg via ORAL
  Filled 2012-10-17 (×16): qty 2
  Filled 2012-10-17: qty 1
  Filled 2012-10-17 (×4): qty 2

## 2012-10-17 MED ORDER — ACETAMINOPHEN 325 MG PO TABS
325.0000 mg | ORAL_TABLET | Freq: Four times a day (QID) | ORAL | Status: DC | PRN
  Administered 2012-10-18 (×2): 650 mg via ORAL
  Filled 2012-10-17: qty 2

## 2012-10-17 MED ORDER — DIPHENHYDRAMINE HCL 50 MG/ML IJ SOLN: 12.5000 mg | Freq: Four times a day (QID) | INTRAMUSCULAR | Status: DC | PRN

## 2012-10-17 MED ORDER — SODIUM CHLORIDE 0.9 % IJ SOLN: 9.0000 mL | INTRAMUSCULAR | Status: DC | PRN

## 2012-10-17 MED ORDER — HYDROMORPHONE HCL PF 1 MG/ML IJ SOLN
0.5000 mg | INTRAMUSCULAR | Status: DC | PRN
  Administered 2012-10-17 (×3): 2 mg via INTRAVENOUS
  Administered 2012-10-17: 1 mg via INTRAVENOUS
  Administered 2012-10-17: 2 mg via INTRAVENOUS
  Administered 2012-10-17: 1 mg via INTRAVENOUS
  Administered 2012-10-17: 1.5 mg via INTRAVENOUS
  Administered 2012-10-17: 2 mg via INTRAVENOUS
  Filled 2012-10-17: qty 2
  Filled 2012-10-17: qty 1
  Filled 2012-10-17: qty 2
  Filled 2012-10-17: qty 1
  Filled 2012-10-17: qty 2
  Filled 2012-10-17 (×2): qty 1
  Filled 2012-10-17: qty 2

## 2012-10-17 MED ORDER — ACETAMINOPHEN 325 MG PO TABS: 325.0000 mg | ORAL_TABLET | Freq: Four times a day (QID) | ORAL | Status: DC | PRN

## 2012-10-17 MED ORDER — DOCUSATE SODIUM 100 MG PO CAPS
100.0000 mg | ORAL_CAPSULE | Freq: Two times a day (BID) | ORAL | Status: DC
  Administered 2012-10-17 (×2): 100 mg via ORAL
  Filled 2012-10-17 (×4): qty 1

## 2012-10-17 MED ORDER — NALOXONE HCL 0.4 MG/ML IJ SOLN: 0.4000 mg | INTRAMUSCULAR | Status: DC | PRN

## 2012-10-17 MED ORDER — ACETAMINOPHEN 650 MG RE SUPP: 325.0000 mg | Freq: Four times a day (QID) | RECTAL | Status: DC | PRN

## 2012-10-17 MED ORDER — DIPHENHYDRAMINE HCL 12.5 MG/5ML PO ELIX: 12.5000 mg | ORAL_SOLUTION | Freq: Four times a day (QID) | ORAL | Status: DC | PRN

## 2012-10-17 MED ORDER — ONDANSETRON HCL 4 MG/2ML IJ SOLN: 4.0000 mg | Freq: Four times a day (QID) | INTRAMUSCULAR | Status: DC | PRN

## 2012-10-17 MED ORDER — HYDROMORPHONE 0.3 MG/ML IV SOLN
INTRAVENOUS | Status: DC
  Administered 2012-10-17: 2.7 mg via INTRAVENOUS
  Administered 2012-10-17: 0.3 mg via INTRAVENOUS
  Filled 2012-10-17: qty 25

## 2012-10-17 MED ORDER — PIPERACILLIN-TAZOBACTAM 3.375 G IVPB
3.3750 g | Freq: Three times a day (TID) | INTRAVENOUS | Status: DC
  Administered 2012-10-17 – 2012-10-23 (×18): 3.375 g via INTRAVENOUS
  Filled 2012-10-17 (×19): qty 50

## 2012-10-17 NOTE — Care Management Note (Signed)
    Page 1 of 1   10/17/2012     11:06:54 AM   CARE MANAGEMENT NOTE 10/17/2012  Patient:  Jacob [name], Jacob [name]   Account Number:  [id number]  Date Initiated:  10/17/2012  Documentation initiated by:  Sunday Spillers  Subjective/Objective Assessment:   21 yo admitted with post appy abscess, s/p surgical drain. PTA lived at home     Action/Plan:   Home when stable.   Anticipated DC Date:  10/20/2012   Anticipated DC Plan:  [place]  CM consult      Choice offered to / List presented to:             Status of service:  In process, will continue to follow Medicare Important Message given?   (If response is "NO", the following Medicare IM given date [place] will be blank) Date Medicare IM given:   Date Additional Medicare IM given:    Discharge Disposition:    Per UR Regulation:  Reviewed for med. necessity/level of care/duration of stay  If discussed at [place] of Stay Meetings, dates discussed:    Comments:

## 2012-10-17 NOTE — Progress Notes (Signed)
Patient ID: Jacob [name], male   DOB: 1991-01-22, 21 y.o.   MRN: [id number] 1 Day Post-Op  Subjective: Pt having a lot of abdominal pain, feels very bloated, denies flatus or BM, denies nausea/vomiting, PCA is going off a lot and he wants to d/c it, c/o burning with urination and hesitancy  Objective: Vital signs in last 24 hours: Temp:  [97.6 F (36.4 C)-101.3 F (38.5 C)] 99.6 F (37.6 C) (12/06 0618) Pulse Rate:  [85-120] 92  (12/06 0618) Resp:  [14-22] 18  (12/06 0618) BP: (116-143)/(68-98) 122/72 mmHg (12/06 0618) SpO2:  [92 %-100 %] 95 % (12/06 0618) Weight:  [202 lb 12.8 oz (91.99 kg)] 202 lb 12.8 oz (91.99 kg) October 19, 2023 1520)    Intake/Output from previous day: 10/19/2023 0701 - 12/06 0700 In: 2207.5 [I.V.:2207.5] Out: 1700 [Urine:1450; Drains:250] Intake/Output this shift:    PE: Abd: mildly distended, +BS, very tender over umbilicus and drain area Drain with serosang, cloudy drainage 250cc/24hrs  Lab Results:   Basename 10/17/12 0414 2012/10/18 1329  WBC 23.2* 17.9*  HGB 12.4* 13.4  HCT 36.8* 40.3  PLT 284 293   BMET  Basename 10/17/12 0414 10/18/2012 1329 10-18-2012 1002  NA 134* -- 136  K 4.4 -- 3.5  CL 101 -- 104  CO2 24 -- 24  GLUCOSE 128* -- 110*  BUN 9 -- 12  CREATININE 0.87 0.86 --  CALCIUM 9.1 -- 8.3*   PT/INR No results found for this basename: LABPROT:2,INR:2 in the last 72 hours CMP     Component Value Date/Time   NA 134* 10/17/2012 0414   K 4.4 10/17/2012 0414   CL 101 10/17/2012 0414   CO2 24 10/17/2012 0414   GLUCOSE 128* 10/17/2012 0414   BUN 9 10/17/2012 0414   CREATININE 0.87 10/17/2012 0414   CALCIUM 9.1 10/17/2012 0414   PROT 6.5 10/17/2012 0414   ALBUMIN 2.8* 10/17/2012 0414   AST 13 10/17/2012 0414   ALT 28 10/17/2012 0414   ALKPHOS 85 10/17/2012 0414   BILITOT 0.5 10/17/2012 0414   GFRNONAA >90 10/17/2012 0414   GFRAA >90 10/17/2012 0414   Lipase  No results found for this basename: lipase       Studies/Results: Dg Abd 1  View  10/18/2012  *RADIOLOGY REPORT*  Clinical Data: Appendectomy 2 days ago, now with severe right-sided pain  ABDOMEN - 1 VIEW  Comparison: CT abdomen pelvis of 08/05/2012  Findings: A left lateral decubitus view of the abdomen was performed.  No free intraperitoneal air is seen.  There is some small bowel gas present most likely reflecting ileus.  No definite obstruction is noted.  IMPRESSION: No free air.  Question mild ileus.   Original Report Authenticated By: Ivar Drape, M.D.    Ct Abdomen Pelvis W Contrast  10-18-12  *RADIOLOGY REPORT*  Clinical Data: Severe right lower quadrant pain, status post appendectomy 2 days ago.  Rule out abscess.  CT ABDOMEN AND PELVIS WITH CONTRAST  Technique:  Multidetector CT imaging of the abdomen and pelvis was performed following the standard protocol during bolus administration of intravenous contrast.  Contrast: 144m OMNIPAQUE IOHEXOL 300 MG/ML  SOLN  Comparison: Plain film of earlier today.  CT of 08/05/2012.  Findings: Lung bases:  Clear lung bases.  Normal heart size without pericardial or pleural effusion.  Abdomen/pelvis:  Moderate volume free intraperitoneal air, likely postoperative.  Normal liver, spleen, stomach, pancreas, gallbladder, biliary tract, adrenal glands, kidneys.  No retroperitoneal or retrocrural adenopathy.  Normal colon and  terminal ileum.  There is moderate edema centered about the operative bed, status post appendectomy.  Small locules of mesenteric interloop fluid are identified with ill-defined hyperenhancement.  The most well-defined focus measures 2.1 x 1.6 cm on image 66/series 2 and coronal image 37/series 3.  Small bowel loops are normal in caliber.  There is likely mild secondary small bowel wall thickening in the region of postoperative inflammation.  Mildly prominent ileocolic mesenteric nodes are improved since the prior exam and likely reactive.  No pelvic adenopathy.    Normal urinary bladder and prostate. Small volume  cul-de-sac fluid, without collection or abnormal peritoneal enhancement.  Image 79/series 2.  Bones/Musculoskeletal:  No acute osseous abnormality.  IMPRESSION:  1.  Status post appendectomy 2 days ago.  Free intraperitoneal air, which is likely secondary. 2. Moderate inflammation in the right lower quadrant with small foci of mesenteric interloop fluid. A small collection without well- defined borders could represent phlegmon or early tiny abscess.  If symptoms persist, short-term follow-up CT should be considered. 3.  Mild inflammation of distal small bowel loops, likely secondary. 4.  Small volume pelvic cul-de-sac fluid, without cul-de-sac abscess.   Original Report Authenticated By: Abigail Miyamoto, M.D.     Anti-infectives: Anti-infectives     Start     Dose/Rate Route Frequency Ordered Stop   10/16/12 2359   piperacillin-tazobactam (ZOSYN) IVPB 3.375 g  Status:  Discontinued        3.375 g 12.5 mL/hr over 240 Minutes Intravenous Every 8 hours 10/16/12 1802 10/16/12 2158   10/16/12 2200   piperacillin-tazobactam (ZOSYN) IVPB 3.375 g  Status:  Discontinued        3.375 g 12.5 mL/hr over 240 Minutes Intravenous 3 times per day 10/16/12 1338 10/16/12 1802   10/16/12 1400  piperacillin-tazobactam (ZOSYN) IVPB 3.375 g       3.375 g 100 mL/hr over 30 Minutes Intravenous STAT 10/16/12 1338 10/16/12 1705           Assessment/Plan 1. POD#1-laparoscopic drain placement: having expected pain, low grade fever  --continue zosyn  --d/c PCA  --add oxycodone and dilaudid for breathrough pain  --up out of bed  --add colace bid  --may have ice and water  2. Dysuria: will check UA today  LOS: 1 day    Kentavius Dettore 10/17/2012

## 2012-10-17 NOTE — Progress Notes (Signed)
Patient complained of burning on urination.  Dr Marlou Starks notified.  Received order to obtain urinalysis.

## 2012-10-17 NOTE — Progress Notes (Signed)
Dr Marlou Starks notified of patient refusing morphine and rather have dilaudid. Received order for low-dose Dilaudid PCA.

## 2012-10-18 ENCOUNTER — Encounter (HOSPITAL_COMMUNITY): Payer: Self-pay | Admitting: Surgery

## 2012-10-18 DIAGNOSIS — E669 Obesity, unspecified: Secondary | ICD-10-CM | POA: Diagnosis present

## 2012-10-18 LAB — CBC
HCT: 39 % (ref 39.0–52.0)
Hemoglobin: 12.9 g/dL — ABNORMAL LOW (ref 13.0–17.0)
MCH: 26.9 pg (ref 26.0–34.0)
MCHC: 33.1 g/dL (ref 30.0–36.0)
MCV: 81.3 fL (ref 78.0–100.0)
Platelets: 287 10*3/uL (ref 150–400)
RBC: 4.8 MIL/uL (ref 4.22–5.81)
RDW: 14.8 % (ref 11.5–15.5)
WBC: 13.4 10*3/uL — ABNORMAL HIGH (ref 4.0–10.5)

## 2012-10-18 LAB — DIFFERENTIAL
Basophils Absolute: 0 10*3/uL (ref 0.0–0.1)
Basophils Relative: 0 % (ref 0–1)
Eosinophils Absolute: 0.1 10*3/uL (ref 0.0–0.7)
Eosinophils Relative: 1 % (ref 0–5)
Lymphocytes Relative: 10 % — ABNORMAL LOW (ref 12–46)
Lymphs Abs: 1.2 10*3/uL (ref 0.7–4.0)
Monocytes Absolute: 1.2 10*3/uL — ABNORMAL HIGH (ref 0.1–1.0)
Monocytes Relative: 9 % (ref 3–12)
Neutro Abs: 10.3 10*3/uL — ABNORMAL HIGH (ref 1.7–7.7)
Neutrophils Relative %: 81 % — ABNORMAL HIGH (ref 43–77)

## 2012-10-18 MED ORDER — BISACODYL 10 MG RE SUPP: 10.0000 mg | Freq: Two times a day (BID) | RECTAL | Status: DC | PRN

## 2012-10-18 MED ORDER — ALUM & MAG HYDROXIDE-SIMETH 200-200-20 MG/5ML PO SUSP
30.0000 mL | Freq: Four times a day (QID) | ORAL | Status: DC | PRN
  Administered 2012-10-23: 30 mL via ORAL
  Filled 2012-10-18 (×2): qty 30

## 2012-10-18 MED ORDER — LACTATED RINGERS IV BOLUS (SEPSIS): 1000.0000 mL | Freq: Three times a day (TID) | INTRAVENOUS | Status: AC | PRN

## 2012-10-18 MED ORDER — ACETAMINOPHEN 650 MG RE SUPP: 650.0000 mg | Freq: Four times a day (QID) | RECTAL | Status: DC | PRN

## 2012-10-18 MED ORDER — PROMETHAZINE HCL 25 MG/ML IJ SOLN
12.5000 mg | Freq: Four times a day (QID) | INTRAMUSCULAR | Status: DC | PRN
Start: 2012-10-18 — End: 2012-10-25
  Administered 2012-10-19: 25 mg via INTRAVENOUS
  Filled 2012-10-18: qty 1

## 2012-10-18 MED ORDER — VITAMIN C 500 MG PO TABS
1000.0000 mg | ORAL_TABLET | Freq: Every day | ORAL | Status: DC
  Administered 2012-10-18 – 2012-10-24 (×7): 1000 mg via ORAL
  Filled 2012-10-18 (×9): qty 2

## 2012-10-18 MED ORDER — LIP MEDEX EX OINT
1.0000 "application " | TOPICAL_OINTMENT | Freq: Two times a day (BID) | CUTANEOUS | Status: DC
  Administered 2012-10-18 – 2012-10-25 (×10): 1 via TOPICAL
  Filled 2012-10-18: qty 7

## 2012-10-18 MED ORDER — ACETAMINOPHEN 500 MG PO TABS: 1000.0000 mg | ORAL_TABLET | Freq: Three times a day (TID) | ORAL | Status: DC

## 2012-10-18 MED ORDER — AMPHETAMINE-DEXTROAMPHET ER 10 MG PO CP24: 10.0000 mg | ORAL_CAPSULE | Freq: Every day | ORAL | Status: DC

## 2012-10-18 MED ORDER — ACETAMINOPHEN 325 MG PO TABS
325.0000 mg | ORAL_TABLET | Freq: Four times a day (QID) | ORAL | Status: DC | PRN
  Administered 2012-10-18 – 2012-10-25 (×3): 650 mg via ORAL
  Filled 2012-10-18: qty 1
  Filled 2012-10-18 (×2): qty 2

## 2012-10-18 MED ORDER — NAPROXEN 500 MG PO TABS
500.0000 mg | ORAL_TABLET | Freq: Two times a day (BID) | ORAL | Status: DC
  Administered 2012-10-18 – 2012-10-24 (×13): 500 mg via ORAL
  Filled 2012-10-18 (×17): qty 1

## 2012-10-18 MED ORDER — MORPHINE SULFATE 2 MG/ML IJ SOLN
2.0000 mg | INTRAMUSCULAR | Status: DC | PRN
  Administered 2012-10-18 (×2): 4 mg via INTRAVENOUS
  Administered 2012-10-18: 2 mg via INTRAVENOUS
  Administered 2012-10-18 (×3): 4 mg via INTRAVENOUS
  Administered 2012-10-18: 2 mg via INTRAVENOUS
  Administered 2012-10-19 – 2012-10-20 (×12): 4 mg via INTRAVENOUS
  Administered 2012-10-20: 2 mg via INTRAVENOUS
  Administered 2012-10-20: 4 mg via INTRAVENOUS
  Administered 2012-10-20 (×2): 2 mg via INTRAVENOUS
  Administered 2012-10-21 (×5): 4 mg via INTRAVENOUS
  Administered 2012-10-22 – 2012-10-23 (×2): 2 mg via INTRAVENOUS
  Filled 2012-10-18 (×11): qty 2
  Filled 2012-10-18: qty 1
  Filled 2012-10-18 (×2): qty 2
  Filled 2012-10-18: qty 1
  Filled 2012-10-18 (×2): qty 2
  Filled 2012-10-18: qty 1
  Filled 2012-10-18 (×2): qty 2
  Filled 2012-10-18 (×2): qty 1
  Filled 2012-10-18: qty 2
  Filled 2012-10-18: qty 1
  Filled 2012-10-18 (×6): qty 2

## 2012-10-18 MED ORDER — DIPHENHYDRAMINE HCL 50 MG/ML IJ SOLN: 12.5000 mg | Freq: Four times a day (QID) | INTRAMUSCULAR | Status: DC | PRN

## 2012-10-18 MED ORDER — MAGIC MOUTHWASH
15.0000 mL | Freq: Four times a day (QID) | ORAL | Status: DC | PRN
  Filled 2012-10-18: qty 15

## 2012-10-18 MED ORDER — METOPROLOL TARTRATE 12.5 MG HALF TABLET
12.5000 mg | ORAL_TABLET | Freq: Two times a day (BID) | ORAL | Status: DC | PRN
  Filled 2012-10-18: qty 1

## 2012-10-18 MED ORDER — LACTATED RINGERS IV BOLUS (SEPSIS)
1000.0000 mL | Freq: Once | INTRAVENOUS | Status: AC
  Administered 2012-10-18: 1000 mL via INTRAVENOUS

## 2012-10-18 NOTE — Progress Notes (Signed)
Pt's temperature 102.7. MD on call made aware. Orders received: Tylenol PO/PR 325-650 mg Q6 hrs for fever. Will continue to monitor pt.

## 2012-10-18 NOTE — Anesthesia Postprocedure Evaluation (Signed)
  Anesthesia Post-op Note  Patient: Jacob [name]  Procedure(s) Performed: Procedure(s) (LRB): LAPAROSCOPY DIAGNOSTIC (N/A)  Patient Location: PACU  Anesthesia Type: General  Level of Consciousness: awake and alert   Airway and Oxygen Therapy: Patient Spontanous Breathing  Post-op Pain: mild  Post-op Assessment: Post-op Vital signs reviewed, Patient's Cardiovascular Status Stable, Respiratory Function Stable, Patent Airway and No signs of Nausea or vomiting  Last Vitals:  Filed Vitals:   10/18/12 1346  BP: 140/80  Pulse: 100  Temp: 39.1 C  Resp:     Post-op Vital Signs: stable   Complications: No apparent anesthesia complications

## 2012-10-18 NOTE — Progress Notes (Addendum)
HUIE GHUMAN [id number] November 19, 1990   Subjective:  Mother in room Spiking fevers Drainage more bilious  Feels better - pain less Walking well in hallways + flatus Mild heartburn Hungry - wants to eat  Worried about HB Worried about possible oral thrush.  No pain w swallow.  Noted white on tongue Loopy with Dilaudid - thinks morphine has worked better for him   Objective:  Vital signs:  Filed Vitals:   10/17/12 2325 10/18/12 0140 10/18/12 0649 10/18/12 0734  BP:   131/71   Pulse:   120   Temp: 102.7 F (39.3 C) 98.7 F (37.1 C) 102.9 F (39.4 C) 102.4 F (39.1 C)  TempSrc: Oral Oral Oral Oral  Resp:   18   Height:      Weight:      SpO2:   94%     Last BM Date: 10/16/12  Intake/Output   Yesterday:  12/06 0701 - 12/07 0700 In: 2983.3 [P.O.:80; I.V.:2803.3; IV Piggyback:100] Out: 2731 [Urine:2625; Drains:105; Stool:1] This shift:     Bowel function:  Flatus: y  BM: y  Drain low output but thin bilious  Physical Exam:  General: Pt awake/alert/oriented x4 in no acute distress Eyes: PERRL, normal EOM.  Sclera clear.  No icterus Neuro: CN II-XII intact w/o focal sensory/motor deficits. Lymph: No head/neck/groin lymphadenopathy Psych:  No delerium/psychosis/paranoia.  Chatty, very inquisitive, interrupts often, but pleasant HENT: Normocephalic, Mucus membranes moist dry.  No thrush Neck: Supple, No tracheal deviation Chest: No chest wall pain w good excursion CV:  Pulses intact.  Regular rhythm MS: Normal AROM mjr joints.  No obvious deformity Abdomen: Soft.  Obese. Nondistended.  Mod tender suprapubic but much less per pt.  No pain in abdomen elsewhere.  No incarcerated hernias. Ext:  SCDs BLE.  No mjr edema.  No cyanosis Skin: No petechiae / purpurae  Diagnosis Appendix, Other than Incidental - FIBROUS OBLITERATION OF APPENDICEAL TIP WITH ASSOCIATED ACUTE APPENDICITIS AND FIBROSIS. Mali RUND DO Pathologist, Electronic Signature (Case signed  10/15/2012) Specimen Yuliza Cara and Clinical Information Specimen(s) Obtained: Appendix, Other than Incidental Specimen Clinical Information History of ruptured appendix Ardeen Fillers) Janiylah Hannis Specimen: Appendix. Size: 5.5 x 0.7 x 0.6 cm. Serosa: Pink to dark red with multiple adhesions. Mucosa: Tan pink, smooth and glistening. Wall: Measures 0.2 cm in thickness and grossly appears intact with no evidence of perforation grossly appreciated. Lumen: Patent, measuring 0.1 cm in diameter. Block Summary: One block, three pieces. (JNK:eps 10/14/12)   Problem List:  Principal Problem:  *Ruptured appendicitis-nonop management begun Sept 2013 Active Problems:  ADHD (attention deficit hyperactivity disorder)  Obesity (BMI 30-39.9)    Assessment  Lucky Rathke  21 y.o. male  2 Days Post-Op  Procedure(s): LAPAROSCOPY DIAGNOSTIC  Peritonitis improved with less pain & lower WBC but with low-output leak/fistula near appy site & fevers  Plan:  -sips only.  No PO until fevers break & if output remains low - do not stress the GI tract. -continue IV ABx -follow drainage - low output for now.  Should close over time -avoid reoperating since risk of new/worsening SB/colon breakdown could occur.  Fistula should close w time but follow.  Consider CT scan in 3-4 days to r/o another collection & follow -expect fevers over next 1-2 days - follow & Tx symptomatically since clinically better - but guarded -ADHD seems worse - restart Adderall -switch narcotics & follow -Maalox for GERD - PPI if worse.  Watch out for n/v/ileus but +flatus a hopeful sign -VTE  prophylaxis- SCDs, etc -mobilize as tolerated to help recovery  I discussed the patient's status to the pt & family.  Questions were answered.  They expressed understanding & appreciation.   Adin Hector, M.D., F.A.C.S. Gastrointestinal and Minimally Invasive Surgery [facility] [address] [place], [place]  [phone] Main / Paging [phone] Voice Mail   10/18/2012  CARE TEAM:  PCP: William Hamburger, MD  Outpatient Care Team: Patient Care Team: Provider Default, MD as PCP - General  Inpatient Treatment Team: Treatment Team: Attending Provider: Pedro Earls, MD; Consulting Physician: Nolon Nations, MD; Consulting Physician: Shann Medal, MD; Registered Nurse: Rise Patience, RN; Registered Nurse: Emeterio Reeve, RN   Results:   Labs: Results for orders placed during the hospital encounter of 10/16/12 (from the past 48 hour(s))  CBC     Status: Abnormal   Collection Time   10/16/12 10:02 AM      Component Value Range Comment   WBC 15.6 (*) 4.0 - 10.5 K/uL    RBC 4.60  4.22 - 5.81 MIL/uL    Hemoglobin 12.5 (*) 13.0 - 17.0 g/dL    HCT 36.9 (*) 39.0 - 52.0 %    MCV 80.2  78.0 - 100.0 fL    MCH 27.2  26.0 - 34.0 pg    MCHC 33.9  30.0 - 36.0 g/dL    RDW 14.8  11.5 - 15.5 %    Platelets 279  150 - 400 K/uL   COMPREHENSIVE METABOLIC PANEL     Status: Abnormal   Collection Time   10/16/12 10:02 AM      Component Value Range Comment   Sodium 136  135 - 145 mEq/L    Potassium 3.5  3.5 - 5.1 mEq/L    Chloride 104  96 - 112 mEq/L    CO2 24  19 - 32 mEq/L    Glucose, Bld 110 (*) 70 - 99 mg/dL    BUN 12  6 - 23 mg/dL    Creatinine, Ser 0.76  0.50 - 1.35 mg/dL    Calcium 8.3 (*) 8.4 - 10.5 mg/dL    Total Protein 6.0  6.0 - 8.3 g/dL    Albumin 3.1 (*) 3.5 - 5.2 g/dL    AST 25  0 - 37 U/L    ALT 39  0 - 53 U/L    Alkaline Phosphatase 89  39 - 117 U/L    Total Bilirubin 0.4  0.3 - 1.2 mg/dL    GFR calc non Af Amer >90  >90 mL/min    GFR calc Af Amer >90  >90 mL/min   URINALYSIS, ROUTINE W REFLEX MICROSCOPIC     Status: Normal   Collection Time   10/16/12 12:20 PM      Component Value Range Comment   Color, Urine YELLOW  YELLOW    APPearance CLEAR  CLEAR    Specific Gravity, Urine 1.022  1.005 - 1.030    pH 7.0  5.0 - 8.0    Glucose, UA NEGATIVE   NEGATIVE mg/dL    Hgb urine dipstick NEGATIVE  NEGATIVE    Bilirubin Urine NEGATIVE  NEGATIVE    Ketones, ur NEGATIVE  NEGATIVE mg/dL    Protein, ur NEGATIVE  NEGATIVE mg/dL    Urobilinogen, UA 0.2  0.0 - 1.0 mg/dL    Nitrite NEGATIVE  NEGATIVE    Leukocytes, UA NEGATIVE  NEGATIVE MICROSCOPIC NOT DONE ON URINES WITH NEGATIVE  PROTEIN, BLOOD, LEUKOCYTES, NITRITE, OR GLUCOSE <1000 mg/dL.  CBC     Status: Abnormal   Collection Time   10/16/12  1:29 PM      Component Value Range Comment   WBC 17.9 (*) 4.0 - 10.5 K/uL    RBC 4.98  4.22 - 5.81 MIL/uL    Hemoglobin 13.4  13.0 - 17.0 g/dL    HCT 40.3  39.0 - 52.0 %    MCV 80.9  78.0 - 100.0 fL    MCH 26.9  26.0 - 34.0 pg    MCHC 33.3  30.0 - 36.0 g/dL    RDW 14.8  11.5 - 15.5 %    Platelets 293  150 - 400 K/uL   CREATININE, SERUM     Status: Normal   Collection Time   10/16/12  1:29 PM      Component Value Range Comment   Creatinine, Ser 0.86  0.50 - 1.35 mg/dL    GFR calc non Af Amer >90  >90 mL/min    GFR calc Af Amer >90  >90 mL/min   SURGICAL PCR SCREEN     Status: Abnormal   Collection Time   10/16/12  6:03 PM      Component Value Range Comment   MRSA, PCR NEGATIVE  NEGATIVE    Staphylococcus aureus POSITIVE (*) NEGATIVE   CBC     Status: Abnormal   Collection Time   10/17/12  4:14 AM      Component Value Range Comment   WBC 23.2 (*) 4.0 - 10.5 K/uL    RBC 4.57  4.22 - 5.81 MIL/uL    Hemoglobin 12.4 (*) 13.0 - 17.0 g/dL    HCT 36.8 (*) 39.0 - 52.0 %    MCV 80.5  78.0 - 100.0 fL    MCH 27.1  26.0 - 34.0 pg    MCHC 33.7  30.0 - 36.0 g/dL    RDW 14.9  11.5 - 15.5 %    Platelets 284  150 - 400 K/uL   COMPREHENSIVE METABOLIC PANEL     Status: Abnormal   Collection Time   10/17/12  4:14 AM      Component Value Range Comment   Sodium 134 (*) 135 - 145 mEq/L    Potassium 4.4  3.5 - 5.1 mEq/L    Chloride 101  96 - 112 mEq/L    CO2 24  19 - 32 mEq/L    Glucose, Bld 128 (*) 70 - 99 mg/dL    BUN 9  6 - 23 mg/dL    Creatinine, Ser  0.87  0.50 - 1.35 mg/dL    Calcium 9.1  8.4 - 10.5 mg/dL    Total Protein 6.5  6.0 - 8.3 g/dL    Albumin 2.8 (*) 3.5 - 5.2 g/dL    AST 13  0 - 37 U/L    ALT 28  0 - 53 U/L    Alkaline Phosphatase 85  39 - 117 U/L    Total Bilirubin 0.5  0.3 - 1.2 mg/dL    GFR calc non Af Amer >90  >90 mL/min    GFR calc Af Amer >90  >90 mL/min   URINALYSIS, ROUTINE W REFLEX MICROSCOPIC     Status: Normal   Collection Time   10/17/12  8:30 AM      Component Value Range Comment   Color, Urine YELLOW  YELLOW    APPearance CLEAR  CLEAR    Specific Gravity, Urine 1.016  1.005 - 1.030    pH 7.0  5.0 - 8.0    Glucose, UA NEGATIVE  NEGATIVE mg/dL    Hgb urine dipstick NEGATIVE  NEGATIVE    Bilirubin Urine NEGATIVE  NEGATIVE    Ketones, ur NEGATIVE  NEGATIVE mg/dL    Protein, ur NEGATIVE  NEGATIVE mg/dL    Urobilinogen, UA 0.2  0.0 - 1.0 mg/dL    Nitrite NEGATIVE  NEGATIVE    Leukocytes, UA NEGATIVE  NEGATIVE MICROSCOPIC NOT DONE ON URINES WITH NEGATIVE PROTEIN, BLOOD, LEUKOCYTES, NITRITE, OR GLUCOSE <1000 mg/dL.  CBC     Status: Abnormal   Collection Time   10/18/12  4:45 AM      Component Value Range Comment   WBC 13.4 (*) 4.0 - 10.5 K/uL    RBC 4.80  4.22 - 5.81 MIL/uL    Hemoglobin 12.9 (*) 13.0 - 17.0 g/dL    HCT 39.0  39.0 - 52.0 %    MCV 81.3  78.0 - 100.0 fL    MCH 26.9  26.0 - 34.0 pg    MCHC 33.1  30.0 - 36.0 g/dL    RDW 14.8  11.5 - 15.5 %    Platelets 287  150 - 400 K/uL     Imaging / Studies: Dg Abd 1 View  10/16/2012  *RADIOLOGY REPORT*  Clinical Data: Appendectomy 2 days ago, now with severe right-sided pain  ABDOMEN - 1 VIEW  Comparison: CT abdomen pelvis of 08/05/2012  Findings: A left lateral decubitus view of the abdomen was performed.  No free intraperitoneal air is seen.  There is some small bowel gas present most likely reflecting ileus.  No definite obstruction is noted.  IMPRESSION: No free air.  Question mild ileus.   Original Report Authenticated By: Ivar Drape, M.D.    Ct  Abdomen Pelvis W Contrast  10/16/2012  *RADIOLOGY REPORT*  Clinical Data: Severe right lower quadrant pain, status post appendectomy 2 days ago.  Rule out abscess.  CT ABDOMEN AND PELVIS WITH CONTRAST  Technique:  Multidetector CT imaging of the abdomen and pelvis was performed following the standard protocol during bolus administration of intravenous contrast.  Contrast: 166m OMNIPAQUE IOHEXOL 300 MG/ML  SOLN  Comparison: Plain film of earlier today.  CT of 08/05/2012.  Findings: Lung bases:  Clear lung bases.  Normal heart size without pericardial or pleural effusion.  Abdomen/pelvis:  Moderate volume free intraperitoneal air, likely postoperative.  Normal liver, spleen, stomach, pancreas, gallbladder, biliary tract, adrenal glands, kidneys.  No retroperitoneal or retrocrural adenopathy.  Normal colon and terminal ileum.  There is moderate edema centered about the operative bed, status post appendectomy.  Small locules of mesenteric interloop fluid are identified with ill-defined hyperenhancement.  The most well-defined focus measures 2.1 x 1.6 cm on image 66/series 2 and coronal image 37/series 3.  Small bowel loops are normal in caliber.  There is likely mild secondary small bowel wall thickening in the region of postoperative inflammation.  Mildly prominent ileocolic mesenteric nodes are improved since the prior exam and likely reactive.  No pelvic adenopathy.    Normal urinary bladder and prostate. Small volume cul-de-sac fluid, without collection or abnormal peritoneal enhancement.  Image 79/series 2.  Bones/Musculoskeletal:  No acute osseous abnormality.  IMPRESSION:  1.  Status post appendectomy 2 days ago.  Free intraperitoneal air, which is likely secondary. 2. Moderate inflammation in the right lower quadrant with small foci of mesenteric interloop fluid. A small collection without well- defined borders could represent  phlegmon or early tiny abscess.  If symptoms persist, short-term follow-up CT should  be considered. 3.  Mild inflammation of distal small bowel loops, likely secondary. 4.  Small volume pelvic cul-de-sac fluid, without cul-de-sac abscess.   Original Report Authenticated By: Abigail Miyamoto, M.D.     Medications / Allergies: per chart  Antibiotics: Anti-infectives     Start     Dose/Rate Route Frequency Ordered Stop   10/17/12 1000  piperacillin-tazobactam (ZOSYN) IVPB 3.375 g       3.375 g 12.5 mL/hr over 240 Minutes Intravenous Every 8 hours 10/17/12 0842     10/16/12 2359   piperacillin-tazobactam (ZOSYN) IVPB 3.375 g  Status:  Discontinued        3.375 g 12.5 mL/hr over 240 Minutes Intravenous Every 8 hours 10/16/12 1802 10/16/12 2158   10/16/12 2200   piperacillin-tazobactam (ZOSYN) IVPB 3.375 g  Status:  Discontinued        3.375 g 12.5 mL/hr over 240 Minutes Intravenous 3 times per day 10/16/12 1338 10/16/12 1802   10/16/12 1400  piperacillin-tazobactam (ZOSYN) IVPB 3.375 g       3.375 g 100 mL/hr over 30 Minutes Intravenous STAT 10/16/12 1338 10/16/12 1705

## 2012-10-19 ENCOUNTER — Encounter (HOSPITAL_COMMUNITY): Payer: Self-pay | Admitting: Surgery

## 2012-10-19 DIAGNOSIS — K632 Fistula of intestine: Secondary | ICD-10-CM

## 2012-10-19 HISTORY — DX: Fistula of intestine: K63.2

## 2012-10-19 LAB — CBC WITH DIFFERENTIAL/PLATELET
Basophils Absolute: 0 10*3/uL (ref 0.0–0.1)
Eosinophils Absolute: 0 10*3/uL (ref 0.0–0.7)
Eosinophils Relative: 0 % (ref 0–5)
MCH: 27.2 pg (ref 26.0–34.0)
MCV: 79.8 fL (ref 78.0–100.0)
Platelets: 269 10*3/uL (ref 150–400)
RDW: 14.5 % (ref 11.5–15.5)

## 2012-10-19 LAB — BASIC METABOLIC PANEL
Calcium: 8.8 mg/dL (ref 8.4–10.5)
Creatinine, Ser: 1 mg/dL (ref 0.50–1.35)
GFR calc non Af Amer: >90 mL/min (ref >90)
Glucose, Bld: 97 mg/dL (ref 70–99)
Sodium: 130 mEq/L — ABNORMAL LOW (ref 135–145)

## 2012-10-19 MED ORDER — AMPHETAMINE-DEXTROAMPHET ER 10 MG PO CP24: 10.0000 mg | ORAL_CAPSULE | ORAL | Status: DC | PRN

## 2012-10-19 NOTE — Progress Notes (Signed)
Offered to change dressing. Pt stated " Can I just take a nap now and I'll get up later, shower and have it changed." Reassured.  Current dressing clean, dry and intact with minimal to moderate drainage noted out of JP drain today. Parents at bedside.

## 2012-10-19 NOTE — Progress Notes (Signed)
ANTIBIOTIC CONSULT NOTE - Follow up  Pharmacy Consult for Zosyn Indication: Intra-abdominal infection  No Known Allergies  Patient Measurements: Height: 5' 8"  (172.7 cm) Weight: 202 lb 12.8 oz (91.99 kg) IBW/kg (Calculated) : 68.4   Vital Signs: Temp: 102.1 F (38.9 C) (12/08 0600) Temp src: Oral (12/08 0600) BP: 118/74 mmHg (12/08 0600) Pulse Rate: 98  (12/08 0600) Total I/O In: 0  Out: 600 [Urine:600]  Labs:  Basename 10/19/12 0523 10/18/12 0445 10/17/12 0414  WBC 9.1 13.4* 23.2*  HGB 11.3* 12.9* 12.4*  PLT 269 287 284  LABCREA -- -- --  CREATININE 1.00 -- 0.87   Estimated Creatinine Clearance: 127.5 ml/min (by C-G formula based on Cr of 1).   Microbiology: Recent Results (from the past 720 hour(s))  SURGICAL PCR SCREEN     Status: Abnormal   Collection Time   10/13/12 11:52 AM      Component Value Range Status Comment   MRSA, PCR NEGATIVE  NEGATIVE Final    Staphylococcus aureus POSITIVE (*) NEGATIVE Final   SURGICAL PCR SCREEN     Status: Abnormal   Collection Time   10/16/12  6:03 PM      Component Value Range Status Comment   MRSA, PCR NEGATIVE  NEGATIVE Final    Staphylococcus aureus POSITIVE (*) NEGATIVE Final    Medications:  Anti-infectives     Start     Dose/Rate Route Frequency Ordered Stop   10/17/12 1000  piperacillin-tazobactam (ZOSYN) IVPB 3.375 g       3.375 g 12.5 mL/hr over 240 Minutes Intravenous Every 8 hours 10/17/12 0842     10/16/12 2359   piperacillin-tazobactam (ZOSYN) IVPB 3.375 g  Status:  Discontinued        3.375 g 12.5 mL/hr over 240 Minutes Intravenous Every 8 hours 10/16/12 1802 10/16/12 2158   10/16/12 2200   piperacillin-tazobactam (ZOSYN) IVPB 3.375 g  Status:  Discontinued        3.375 g 12.5 mL/hr over 240 Minutes Intravenous 3 times per day 10/16/12 1338 10/16/12 1802   10/16/12 1400  piperacillin-tazobactam (ZOSYN) IVPB 3.375 g       3.375 g 100 mL/hr over 30 Minutes Intravenous STAT 10/16/12 1338 10/16/12 1705           Assessment:  21 y/o M with ruptured appendicitis 07/16/12 treated in [place], s/p laparoscopic appendectomy 10/14/12 at [facility].  Now with peritonitis, s/p drain placement in OR.    Renal function stable, CrCl > 100 ml/min  WBC wnl, Tm 103.6, currently 102.1  Goal of Therapy:  Eradication of infection; Adjust Zosyn dosage for estimated renal function  Plan:   Continue Zosyn 3.375g IV Q8H infused over 4hrs.  Follow up renal function  Gretta Arab PharmD, BCPS Pager [phone] 10/19/2012 1:51 PM

## 2012-10-19 NOTE — Progress Notes (Addendum)
Jacob [name] [id number] 1991-02-01   Subjective:  Mother in room Spiking less fevers Drainage bilious Walking well in hallways & in room + flatus Straining to have a BM  Feels a little better - pain intermittent but a little less  Objective:  Vital signs:  Filed Vitals:   10/18/12 1610 10/18/12 1827 10/18/12 2200 10/19/12 0600  BP:   115/77 118/74  Pulse:   94 98  Temp: 102 F (38.9 C) 98.7 F (37.1 C) 99.4 F (37.4 C) 102.1 F (38.9 C)  TempSrc:   Oral Oral  Resp:   18 18  Height:      Weight:      SpO2:        Last BM Date: 10/16/12  Intake/Output   Yesterday:  12/07 0701 - 12/08 0700 In: 2211.7 [I.V.:1171.7; IV Piggyback:1000] Out: 0102 [Urine:3350; Drains:10] This shift:     Bowel function:  Flatus: y  BM: N  Drain 40 ML bile in bulb + 10 mL yest AM  Physical Exam:  General: Pt awake/alert/oriented x4 in no acute distress Eyes: PERRL, normal EOM.  Sclera clear.  No icterus Neuro: CN II-XII intact w/o focal sensory/motor deficits. Lymph: No head/neck/groin lymphadenopathy Psych:  No delerium/psychosis/paranoia.  Inquisitive but pleasant HENT: Normocephalic, Mucus membranes moist dry.  No thrush Neck: Supple, No tracheal deviation Chest: No chest wall pain w good excursion CV:  Pulses intact.  Regular rhythm MS: Normal AROM mjr joints.  No obvious deformity Abdomen: Soft.  Obese. Mildly distended.  Mild tender suprapubic> LLQ but less.  No pain in abdomen elsewhere.  No incarcerated hernias. Ext:  SCDs BLE.  No mjr edema.  No cyanosis Skin: No petechiae / purpurae  Diagnosis Appendix, Other than Incidental - FIBROUS OBLITERATION OF APPENDICEAL TIP WITH ASSOCIATED ACUTE APPENDICITIS AND FIBROSIS. Mali RUND DO Pathologist, Electronic Signature (Case signed 10/15/2012) Specimen Carynn Felling and Clinical Information Specimen(s) Obtained: Appendix, Other than Incidental Specimen Clinical Information History of ruptured appendix  Ardeen Fillers) Kanya Potteiger Specimen: Appendix. Size: 5.5 x 0.7 x 0.6 cm. Serosa: Pink to dark red with multiple adhesions. Mucosa: Tan pink, smooth and glistening. Wall: Measures 0.2 cm in thickness and grossly appears intact with no evidence of perforation grossly appreciated. Lumen: Patent, measuring 0.1 cm in diameter. Block Summary: One block, three pieces. 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 male  3 Days Post-Op  Procedure(s): LAPAROSCOPY DIAGNOSTIC  Peritonitis improved with less pain & lower WBC but with low-output leak/fistula near appy site & fevers  Plan:  -sips only.  No PO until fevers break & if output remains low - do not stress the GI tract. -continue IV ABx -follow drainage - low output for now.  Should close over time -avoid reoperating since risk of new/worsening SB/colon breakdown could occur.  Fistula should close w time but follow.  Consider CT scan in 3-4 days to r/o another collection & follow -expect fevers over next 1 days - less often now but follow & Tx symptomatically since clinically better - but guarded -Adderall as needed - clarified -Maalox for GERD - PPI if worse.  Watch out for n/v/ileus but +flatus a hopeful sign -VTE prophylaxis- SCDs, etc -mobilize as tolerated to help recovery  I discussed the patient's status to the pt & motherily.  Questions were  answered.  They are concerned & a little frustrated but expressed understanding & appreciation.   Adin Hector, M.D., F.A.C.S. Gastrointestinal and Minimally Invasive Surgery [facility] [address], [place], [place] [address] [phone] Main / Paging [phone] Voice Mail   10/19/2012  CARE TEAM:  PCP: William Hamburger,  MD  Outpatient Care Team: Patient Care Team: Provider Default, MD as PCP - General  Inpatient Treatment Team: Treatment Team: Attending Provider: Pedro Earls, MD; Consulting Physician: Nolon Nations, MD; Consulting Physician: Shann Medal, MD; Registered Nurse: Rise Patience, RN; Registered Nurse: Emeterio Reeve, RN; Respiratory Therapist: Tamera Reason, RRT   Results:   Labs: Results for orders placed during the hospital encounter of 10/16/12 (from the past 48 hour(s))  URINALYSIS, ROUTINE W REFLEX MICROSCOPIC     Status: Normal   Collection Time   10/17/12  8:30 AM      Component Value Range Comment   Color, Urine YELLOW  YELLOW    APPearance CLEAR  CLEAR    Specific Gravity, Urine 1.016  1.005 - 1.030    pH 7.0  5.0 - 8.0    Glucose, UA NEGATIVE  NEGATIVE mg/dL    Hgb urine dipstick NEGATIVE  NEGATIVE    Bilirubin Urine NEGATIVE  NEGATIVE    Ketones, ur NEGATIVE  NEGATIVE mg/dL    Protein, ur NEGATIVE  NEGATIVE mg/dL    Urobilinogen, UA 0.2  0.0 - 1.0 mg/dL    Nitrite NEGATIVE  NEGATIVE    Leukocytes, UA NEGATIVE  NEGATIVE MICROSCOPIC NOT DONE ON URINES WITH NEGATIVE PROTEIN, BLOOD, LEUKOCYTES, NITRITE, OR GLUCOSE <1000 mg/dL.  CBC     Status: Abnormal   Collection Time   10/18/12  4:45 AM      Component Value Range Comment   WBC 13.4 (*) 4.0 - 10.5 K/uL    RBC 4.80  4.22 - 5.81 MIL/uL    Hemoglobin 12.9 (*) 13.0 - 17.0 g/dL    HCT 39.0  39.0 - 52.0 %    MCV 81.3  78.0 - 100.0 fL    MCH 26.9  26.0 - 34.0 pg    MCHC 33.1  30.0 - 36.0 g/dL    RDW 14.8  11.5 - 15.5 %    Platelets 287  150 - 400 K/uL   DIFFERENTIAL     Status: Abnormal   Collection Time   10/18/12  4:45 AM      Component Value Range Comment   Neutrophils Relative 81 (*) 43 - 77 %    Neutro Abs 10.3 (*) 1.7 - 7.7 K/uL    Lymphocytes Relative 10 (*) 12 - 46 %    Lymphs Abs 1.2  0.7 - 4.0 K/uL    Monocytes Relative 9  3 - 12 %    Monocytes Absolute 1.2 (*) 0.1 - 1.0 K/uL    Eosinophils  Relative 1  0 - 5 %    Eosinophils Absolute 0.1  0.0 - 0.7 K/uL    Basophils Relative 0  0 - 1 %    Basophils Absolute 0.0  0.0 - 0.1 K/uL   CBC WITH DIFFERENTIAL     Status: Abnormal   Collection Time   10/19/12  5:23 AM      Component Value Range Comment   WBC 9.1  4.0 - 10.5 K/uL    RBC 4.16 (*) 4.22 - 5.81 MIL/uL    Hemoglobin 11.3 (*) 13.0 - 17.0 g/dL  HCT 33.2 (*) 39.0 - 52.0 %    MCV 79.8  78.0 - 100.0 fL    MCH 27.2  26.0 - 34.0 pg    MCHC 34.0  30.0 - 36.0 g/dL    RDW 14.5  11.5 - 15.5 %    Platelets 269  150 - 400 K/uL    Neutrophils Relative 82 (*) 43 - 77 %    Neutro Abs 7.4  1.7 - 7.7 K/uL    Lymphocytes Relative 9 (*) 12 - 46 %    Lymphs Abs 0.8  0.7 - 4.0 K/uL    Monocytes Relative 9  3 - 12 %    Monocytes Absolute 0.8  0.1 - 1.0 K/uL    Eosinophils Relative 0  0 - 5 %    Eosinophils Absolute 0.0  0.0 - 0.7 K/uL    Basophils Relative 0  0 - 1 %    Basophils Absolute 0.0  0.0 - 0.1 K/uL   BASIC METABOLIC PANEL     Status: Abnormal   Collection Time   10/19/12  5:23 AM      Component Value Range Comment   Sodium 130 (*) 135 - 145 mEq/L    Potassium 3.9  3.5 - 5.1 mEq/L    Chloride 97  96 - 112 mEq/L    CO2 25  19 - 32 mEq/L    Glucose, Bld 97  70 - 99 mg/dL    BUN 8  6 - 23 mg/dL    Creatinine, Ser 1.00  0.50 - 1.35 mg/dL    Calcium 8.8  8.4 - 10.5 mg/dL    GFR calc non Af Amer >90  >90 mL/min    GFR calc Af Amer >90  >90 mL/min     Imaging / Studies: No results found.  Medications / Allergies: per chart  Antibiotics: Anti-infectives     Start     Dose/Rate Route Frequency Ordered Stop   10/17/12 1000  piperacillin-tazobactam (ZOSYN) IVPB 3.375 g       3.375 g 12.5 mL/hr over 240 Minutes Intravenous Every 8 hours 10/17/12 0842     10/16/12 2359   piperacillin-tazobactam (ZOSYN) IVPB 3.375 g  Status:  Discontinued        3.375 g 12.5 mL/hr over 240 Minutes Intravenous Every 8 hours 10/16/12 1802 10/16/12 2158   10/16/12 2200    piperacillin-tazobactam (ZOSYN) IVPB 3.375 g  Status:  Discontinued        3.375 g 12.5 mL/hr over 240 Minutes Intravenous 3 times per day 10/16/12 1338 10/16/12 1802   10/16/12 1400  piperacillin-tazobactam (ZOSYN) IVPB 3.375 g       3.375 g 100 mL/hr over 30 Minutes Intravenous STAT 10/16/12 1338 10/16/12 1705

## 2012-10-20 LAB — CBC WITH DIFFERENTIAL/PLATELET
Basophils Absolute: 0 10*3/uL (ref 0.0–0.1)
Basophils Relative: 0 % (ref 0–1)
Eosinophils Absolute: 0.2 10*3/uL (ref 0.0–0.7)
Lymphs Abs: 1.1 10*3/uL (ref 0.7–4.0)
MCH: 26.9 pg (ref 26.0–34.0)
Neutrophils Relative %: 69 % (ref 43–77)
Platelets: 263 10*3/uL (ref 150–400)
RBC: 4.64 MIL/uL (ref 4.22–5.81)
RDW: 14.7 % (ref 11.5–15.5)

## 2012-10-20 LAB — BASIC METABOLIC PANEL
BUN: 11 mg/dL (ref 6–23)
Calcium: 8.9 mg/dL (ref 8.4–10.5)
GFR calc Af Amer: >90 mL/min (ref >90)
GFR calc non Af Amer: >90 mL/min (ref >90)
Glucose, Bld: 88 mg/dL (ref 70–99)
Potassium: 3.7 mEq/L (ref 3.5–5.1)
Sodium: 130 mEq/L — ABNORMAL LOW (ref 135–145)

## 2012-10-20 LAB — PHOSPHORUS: Phosphorus: 3.7 mg/dL (ref 2.3–4.6)

## 2012-10-20 LAB — TRIGLYCERIDES: Triglycerides: 120 mg/dL (ref <150)

## 2012-10-20 MED ORDER — KCL IN DEXTROSE-NACL 20-5-0.45 MEQ/L-%-% IV SOLN
INTRAVENOUS | Status: DC
  Administered 2012-10-21 – 2012-10-22 (×4): via INTRAVENOUS
  Filled 2012-10-20 (×7): qty 1000

## 2012-10-20 MED ORDER — FAT EMULSION 20 % IV EMUL
250.0000 mL | INTRAVENOUS | Status: DC
  Filled 2012-10-20: qty 250

## 2012-10-20 MED ORDER — POTASSIUM CHLORIDE IN NACL 20-0.9 MEQ/L-% IV SOLN
INTRAVENOUS | Status: DC
  Administered 2012-10-21: 03:00:00 via INTRAVENOUS
  Filled 2012-10-20: qty 1000

## 2012-10-20 MED ORDER — POTASSIUM CHLORIDE IN NACL 20-0.45 MEQ/L-% IV SOLN
INTRAVENOUS | Status: DC
  Filled 2012-10-20: qty 1000

## 2012-10-20 MED ORDER — TRACE MINERALS CR-CU-F-FE-I-MN-MO-SE-ZN IV SOLN
INTRAVENOUS | Status: DC
  Filled 2012-10-20: qty 1000

## 2012-10-20 NOTE — Progress Notes (Signed)
Peripherally Inserted Central Catheter/Midline Placement  The IV Nurse has discussed with the patient and/or persons authorized to consent for the patient, the purpose of this procedure and the potential benefits and risks involved with this procedure.  The benefits include less needle sticks, lab draws from the catheter and patient may be discharged home with the catheter.  Risks include, but not limited to, infection, bleeding, blood clot (thrombus formation), and puncture of an artery; nerve damage and irregular heat beat.  Alternatives to this procedure were also discussed.  PICC/Midline Placement Documentation        Jacob [name] 10/20/2012, 6:02 PM

## 2012-10-20 NOTE — Progress Notes (Signed)
Spoke to MD Lucia Gaskins regarding IV team were unable to get picc line, patient is currently stating that he does not want any more attempts at this moment, orders received for picc line placement tomorrow by interventional radiology Means, Veronda Prude RN 10-20-2012 18:00pm

## 2012-10-20 NOTE — Progress Notes (Signed)
PARENTERAL NUTRITION CONSULT NOTE - INITIAL  Pharmacy Consult for [facility] Fistula following rupture appendix/appendectomy Indication: TPN  No Known Allergies  Patient Measurements: Height: 5' 8"  (172.7 cm) Weight: 202 lb 12.8 oz (91.99 kg) IBW/kg (Calculated) : 68.4  Adjusted Body Weight: 75.5kg Usual Weight:   Vital Signs: Temp: 99 F (37.2 C) (12/09 0515) Temp src: Oral (12/09 0515) BP: 113/77 mmHg (12/09 0515) Pulse Rate: 84  (12/09 0515) Intake/Output from previous day: 12/08 0701 - 12/09 0700 In: 3169.6 [P.O.:120; I.V.:3049.6] Out: 940 [Urine:900; Drains:40] Intake/Output from this shift: Total I/O In: 755 [I.V.:355; IV Piggyback:400] Out: 10 [Drains:10]  Labs:  [facility] 10/20/12 0447 10/19/12 0523 10/18/12 0445  WBC 9.2 9.1 13.4*  HGB 12.5* 11.3* 12.9*  HCT 37.2* 33.2* 39.0  PLT 263 269 287  APTT -- -- --  INR -- -- --     Basename 10/19/12 0523  NA 130*  K 3.9  CL 97  CO2 25  GLUCOSE 97  BUN 8  CREATININE 1.00  LABCREA --  CREAT24HRUR --  CALCIUM 8.8  MG --  PHOS --  PROT --  ALBUMIN --  AST --  ALT --  ALKPHOS --  BILITOT --  BILIDIR --  IBILI --  PREALBUMIN --  TRIG --  CHOLHDL --  CHOL --   Estimated Creatinine Clearance: 127.5 ml/min (by C-G formula based on Cr of 1).   No results found for this basename: GLUCAP:3 in the last 72 hours  Medical History: Past Medical History  Diagnosis Date  . Acute appendicitis with rupture     Medications:  Scheduled:    . heparin  5,000 Units Subcutaneous Q8H  . lip balm  1 application Topical BID  . naproxen  500 mg Oral BID WC  . piperacillin-tazobactam (ZOSYN)  IV  3.375 g Intravenous Q8H  . vitamin C  1,000 mg Oral Daily    Insulin Requirements in the past 24 hours:  No insulin orders  Current Nutrition:  NPO  Assessment: 21 YOM admitted 12/5 with worsening abd pain from peritonitis. He is s/p laparoscopic appendectomy for rupture appendix on 12/3.  Appears appendicitis was  initially dx in September but due to amount of inflammation it was conservatively treated with diet/antibiotics.  Laparoscopic drain was placed 12/5 by [facility].  Orders for PICC and start TPN 12/9.  Electrolytes: Na = 130, other lytes WNL SCr: 0.77 LFTs: check in am TGs: 12/9 (pending)  Prealbumin: 12/9 (pending) CBGs: none  Nutritional Goals:  Await RD recommendations Estimated needs: 1900-2100 kCal, 90-105 grams of protein per day  Plan:  - Start Clinimix E5/20 tonight at 78m/hr - Lipids 179mhr MWF (Lipids 3x/week d/t national backorder) - MVI and trace elements MWF d/t national backorder - Adjust MIVF to NS + 20KCl and remove D5W with start of TPN - Check CBGs q6h - TNA labs in am then qMon/Thur  ZeElby ShowersDuOsa Craver2/07/2012,10:33 AM

## 2012-10-20 NOTE — Progress Notes (Signed)
Patient ID: Jacob [name], male   DOB: 08-30-1991, 21 y.o.   MRN: [id number] [facility] Surgery Progress Note:   4 Days Post-Op  Subjective: Mental status is clear.  Discussed plan.  Tube stripped and contents are more enteral than before.   Objective: Vital signs in last 24 hours: Temp:  [98 F (36.7 C)-99 F (37.2 C)] 99 F (37.2 C) (12/09 0515) Pulse Rate:  [71-84] 84  (12/09 0515) Resp:  [16-18] 18  (12/09 0515) BP: (108-113)/(70-77) 113/77 mmHg (12/09 0515) SpO2:  [96 %-100 %] 99 % (12/09 0515)  Intake/Output from previous day: 12/08 0701 - 12/09 0700 In: 3169.6 [P.O.:120; I.V.:3049.6] Out: 940 [Urine:900; Drains:40] Intake/Output this shift:    Physical Exam: Work of breathing is normal.  Minimal pain but in RLQ with stripping tube.    Lab Results:  Results for orders placed during the hospital encounter of 10/16/12 (from the past 48 hour(s))  CBC WITH DIFFERENTIAL     Status: Abnormal   Collection Time   10/19/12  5:23 AM      Component Value Range Comment   WBC 9.1  4.0 - 10.5 K/uL    RBC 4.16 (*) 4.22 - 5.81 MIL/uL    Hemoglobin 11.3 (*) 13.0 - 17.0 g/dL    HCT 33.2 (*) 39.0 - 52.0 %    MCV 79.8  78.0 - 100.0 fL    MCH 27.2  26.0 - 34.0 pg    MCHC 34.0  30.0 - 36.0 g/dL    RDW 14.5  11.5 - 15.5 %    Platelets 269  150 - 400 K/uL    Neutrophils Relative 82 (*) 43 - 77 %    Neutro Abs 7.4  1.7 - 7.7 K/uL    Lymphocytes Relative 9 (*) 12 - 46 %    Lymphs Abs 0.8  0.7 - 4.0 K/uL    Monocytes Relative 9  3 - 12 %    Monocytes Absolute 0.8  0.1 - 1.0 K/uL    Eosinophils Relative 0  0 - 5 %    Eosinophils Absolute 0.0  0.0 - 0.7 K/uL    Basophils Relative 0  0 - 1 %    Basophils Absolute 0.0  0.0 - 0.1 K/uL   BASIC METABOLIC PANEL     Status: Abnormal   Collection Time   10/19/12  5:23 AM      Component Value Range Comment   Sodium 130 (*) 135 - 145 mEq/L    Potassium 3.9  3.5 - 5.1 mEq/L    Chloride 97  96 - 112 mEq/L    CO2 25  19 - 32 mEq/L    Glucose,  Bld 97  70 - 99 mg/dL    BUN 8  6 - 23 mg/dL    Creatinine, Ser 1.00  0.50 - 1.35 mg/dL    Calcium 8.8  8.4 - 10.5 mg/dL    GFR calc non Af Amer >90  >90 mL/min    GFR calc Af Amer >90  >90 mL/min   CBC WITH DIFFERENTIAL     Status: Abnormal   Collection Time   10/20/12  4:47 AM      Component Value Range Comment   WBC 9.2  4.0 - 10.5 K/uL    RBC 4.64  4.22 - 5.81 MIL/uL    Hemoglobin 12.5 (*) 13.0 - 17.0 g/dL    HCT 37.2 (*) 39.0 - 52.0 %    MCV 80.2  78.0 -  100.0 fL    MCH 26.9  26.0 - 34.0 pg    MCHC 33.6  30.0 - 36.0 g/dL    RDW 14.7  11.5 - 15.5 %    Platelets 263  150 - 400 K/uL    Neutrophils Relative 69  43 - 77 %    Neutro Abs 6.3  1.7 - 7.7 K/uL    Lymphocytes Relative 12  12 - 46 %    Lymphs Abs 1.1  0.7 - 4.0 K/uL    Monocytes Relative 17 (*) 3 - 12 %    Monocytes Absolute 1.5 (*) 0.1 - 1.0 K/uL    Eosinophils Relative 3  0 - 5 %    Eosinophils Absolute 0.2  0.0 - 0.7 K/uL    Basophils Relative 0  0 - 1 %    Basophils Absolute 0.0  0.0 - 0.1 K/uL     Radiology/Results: No results found.  Anti-infectives: Anti-infectives     Start     Dose/Rate Route Frequency Ordered Stop   10/17/12 1000  piperacillin-tazobactam (ZOSYN) IVPB 3.375 g       3.375 g 12.5 mL/hr over 240 Minutes Intravenous Every 8 hours 10/17/12 0842     10/16/12 2359   piperacillin-tazobactam (ZOSYN) IVPB 3.375 g  Status:  Discontinued        3.375 g 12.5 mL/hr over 240 Minutes Intravenous Every 8 hours 10/16/12 1802 10/16/12 2158   10/16/12 2200   piperacillin-tazobactam (ZOSYN) IVPB 3.375 g  Status:  Discontinued        3.375 g 12.5 mL/hr over 240 Minutes Intravenous 3 times per day 10/16/12 1338 10/16/12 1802   10/16/12 1400  piperacillin-tazobactam (ZOSYN) IVPB 3.375 g       3.375 g 100 mL/hr over 30 Minutes Intravenous STAT 10/16/12 1338 10/16/12 1705          Assessment/Plan: Problem List: Patient Active Problem List  Diagnosis  . Ruptured appendicitis-nonop management begun  Sept 2013  . Obesity (BMI 30-39.9)  . Fistula from cecum    Has lowered WBC and temp but higher output fistula.  Will ask for [facility] line and begin TNA per pharmacy.   4 Days Post-Op    LOS: 4 days   Matt B. Hassell Done, MD, [facility] [phone] beeper [phone]  10/20/2012 10:12 AM

## 2012-10-20 NOTE — Progress Notes (Addendum)
INITIAL ADULT NUTRITION ASSESSMENT Date: 10/20/2012   Time: 1:22 PM Reason for Assessment: Consult  INTERVENTION: TPN per pharmacy. Healthy eating education discussed. Will monitor.   ASSESSMENT: Male 21 y.o.  Dx: Colonic fistula  Food/Nutrition Related Hx: Pt reports eating 3 meals/day of a regular diet PTA with no changes in weight. Pt reports improvement in RLQ pain today after getting pain medication. Pt found to have colonic fistula and is scheduled to start TPN tonight.   Hx:  Past Medical History  Diagnosis Date  . Acute appendicitis with rupture    Related Meds:  Scheduled Meds:   . heparin  5,000 Units Subcutaneous Q8H  . lip balm  1 application Topical BID  . naproxen  500 mg Oral BID WC  . piperacillin-tazobactam (ZOSYN)  IV  3.375 g Intravenous Q8H  . vitamin C  1,000 mg Oral Daily   Continuous Infusions:   . 0.9 % NaCl with KCl 20 mEq / L    . dextrose 5 % and 0.45 % NaCl with KCl 20 mEq/L 75 mL/hr at 10/19/12 2244  . TPN (CLINIMIX) +/- additives     And  . fat emulsion    . [DISCONTINUED] 0.45 % NaCl with KCl 20 mEq / L     PRN Meds:.acetaminophen, acetaminophen, alum & mag hydroxide-simeth, amphetamine-dextroamphetamine, bisacodyl, diphenhydrAMINE, [EXPIRED] lactated ringers, magic mouthwash, metoprolol tartrate, morphine injection, oxyCODONE, promethazine  Ht: 5' 8"  (172.7 cm)  Wt: 202 lb 12.8 oz (91.99 kg)  Ideal Wt: 154 lb % Ideal Wt: 131  Usual Wt: 202 lb % Usual Wt: 100  Wt Readings from Last 10 Encounters:  10/16/12 202 lb 12.8 oz (91.99 kg)  10/16/12 202 lb 12.8 oz (91.99 kg)  10/14/12 200 lb (90.719 kg)  10/14/12 200 lb (90.719 kg)  10/13/12 200 lb (90.719 kg)  08/27/12 196 lb (88.905 kg)  08/07/12 188 lb 4 oz (85.39 kg)  07/21/12 189 lb 12.8 oz (86.093 kg)  11/17/09 191 lb (86.637 kg) (88.28%*)   * Growth percentiles are based on CDC 2-20 Years data.     Body mass index is 30.84 kg/(m^2). Class I obesity   Labs:  CMP      Component Value Date/Time   NA 130* 10/20/2012 1120   K 3.7 10/20/2012 1120   CL 96 10/20/2012 1120   CO2 22 10/20/2012 1120   GLUCOSE 88 10/20/2012 1120   BUN 11 10/20/2012 1120   CREATININE 0.77 10/20/2012 1120   CALCIUM 8.9 10/20/2012 1120   PROT 6.5 10/17/2012 0414   ALBUMIN 2.8* 10/17/2012 0414   AST 13 10/17/2012 0414   ALT 28 10/17/2012 0414   ALKPHOS 85 10/17/2012 0414   BILITOT 0.5 10/17/2012 0414   GFRNONAA >90 10/20/2012 1120   GFRAA >90 10/20/2012 1120    Intake/Output Summary (Last 24 hours) at 10/20/12 1337 Last data filed at 10/20/12 1248  Gross per 24 hour  Intake   2320 ml  Output    840 ml  Net   1480 ml   Last BM - 12/8  Diet Order: NPO   IVF:    0.9 % NaCl with KCl 20 mEq / L   dextrose 5 % and 0.45 % NaCl with KCl 20 mEq/L Last Rate: 75 mL/hr at 10/19/12 2244  TPN (CLINIMIX) +/- additives   And   fat emulsion   [DISCONTINUED] 0.45 % NaCl with KCl 20 mEq / L     Estimated Nutritional Needs:   IOXB:3532-9924 Protein:105-120g Fluid:1.9-2.2L  NUTRITION  DIAGNOSIS: -Inadequate oral intake (NI-2.1).  Status: Ongoing  RELATED TO: inability to eat  AS EVIDENCE BY: NPO  MONITORING/EVALUATION(Goals): TPN to meet >90% of estimated nutritional needs.   EDUCATION NEEDS: -Education needs addressed - used teach back method to educate pt on healthy eating for weight loss which pt plans to follow when he recovers from his illness. Provided pt with handouts of this information.   Dietitian #: [phone]  [place] Per approved criteria  -Obesity Unspecified    Jacob [name] 10/20/2012, 1:22 PM

## 2012-10-20 NOTE — Progress Notes (Signed)
Orders received to restart patient on IV fluids of d51/2ns with 20 potassium at 20m/hr Means, KVeronda PrudeRN 10-20-2012 18:24pm

## 2012-10-21 ENCOUNTER — Ambulatory Visit (HOSPITAL_COMMUNITY): Payer: BC Managed Care – PPO

## 2012-10-21 LAB — CBC WITH DIFFERENTIAL/PLATELET
Basophils Relative: 0 % (ref 0–1)
Eosinophils Absolute: 0.2 10*3/uL (ref 0.0–0.7)
Eosinophils Relative: 2 % (ref 0–5)
Hemoglobin: 12.4 g/dL — ABNORMAL LOW (ref 13.0–17.0)
Lymphs Abs: 1 10*3/uL (ref 0.7–4.0)
MCH: 26.8 pg (ref 26.0–34.0)
MCHC: 33.5 g/dL (ref 30.0–36.0)
MCV: 80.1 fL (ref 78.0–100.0)
Monocytes Relative: 14 % — ABNORMAL HIGH (ref 3–12)
Neutrophils Relative %: 72 % (ref 43–77)
RBC: 4.62 MIL/uL (ref 4.22–5.81)

## 2012-10-21 LAB — COMPREHENSIVE METABOLIC PANEL
Albumin: 2.7 g/dL — ABNORMAL LOW (ref 3.5–5.2)
BUN: 10 mg/dL (ref 6–23)
Calcium: 8.8 mg/dL (ref 8.4–10.5)
Creatinine, Ser: 0.78 mg/dL (ref 0.50–1.35)
GFR calc Af Amer: >90 mL/min (ref >90)
Glucose, Bld: 73 mg/dL (ref 70–99)
Total Protein: 6.5 g/dL (ref 6.0–8.3)

## 2012-10-21 MED ORDER — IOHEXOL 300 MG/ML  SOLN
100.0000 mL | Freq: Once | INTRAMUSCULAR | Status: AC | PRN
  Administered 2012-10-21: 100 mL via INTRAVENOUS

## 2012-10-21 NOTE — Progress Notes (Signed)
PARENTERAL NUTRITION CONSULT NOTE - INITIAL  Pharmacy Consult for [facility] Fistula following ruptured appendix/appendectomy Indication: TPN  No Known Allergies  Patient Measurements: Height: 5' 8"  (172.7 cm) Weight: 202 lb 12.8 oz (91.99 kg) IBW/kg (Calculated) : 68.4  Adjusted Body Weight: 75.5kg Usual Weight:   Vital Signs: Temp: 98.8 F (37.1 C) (12/10 0600) Temp src: Oral (12/10 0600) BP: 100/68 mmHg (12/10 0600) Pulse Rate: 79  (12/10 0600) Intake/Output from previous day: 12/09 0701 - 12/10 0700 In: 2545.5 [I.V.:2095.5; IV Piggyback:450] Out: 7867 [Urine:1650; Drains:130] Intake/Output from this shift:    Labs:  [facility] 10/21/12 0440 10/20/12 0447 10/19/12 0523  WBC 8.5 9.2 9.1  HGB 12.4* 12.5* 11.3*  HCT 37.0* 37.2* 33.2*  PLT 280 263 269  APTT -- -- --  INR -- -- --     Basename 10/21/12 0440 10/21/12 0438 10/20/12 1120 10/19/12 0523  NA 134* -- 130* 130*  K 3.4* -- 3.7 3.9  CL 98 -- 96 97  CO2 21 -- 22 25  GLUCOSE 73 -- 88 97  BUN 10 -- 11 8  CREATININE 0.78 -- 0.77 1.00  LABCREA -- -- -- --  CREAT24HRUR -- -- -- --  CALCIUM 8.8 -- 8.9 8.8  MG 2.0 -- 2.0 --  PHOS 3.5 -- 3.7 --  PROT 6.5 -- -- --  ALBUMIN 2.7* -- -- --  AST 15 -- -- --  ALT 14 -- -- --  ALKPHOS 101 -- -- --  BILITOT 0.3 -- -- --  BILIDIR -- -- -- --  IBILI -- -- -- --  PREALBUMIN -- -- 8.1* --  TRIG -- -- 120 --  CHOLHDL -- -- -- --  CHOL -- 148 -- --   Estimated Creatinine Clearance: 159.4 ml/min (by C-G formula based on Cr of 0.78).   No results found for this basename: GLUCAP:3 in the last 72 hours  Medical History: Past Medical History  Diagnosis Date  . Acute appendicitis with rupture     Medications:  Scheduled:     . heparin  5,000 Units Subcutaneous Q8H  . lip balm  1 application Topical BID  . naproxen  500 mg Oral BID WC  . piperacillin-tazobactam (ZOSYN)  IV  3.375 g Intravenous Q8H  . vitamin C  1,000 mg Oral Daily    Insulin Requirements in the past  24 hours:  No insulin orders  Current Nutrition:  NPO  Assessment: 21 YOM admitted 12/5 with worsening abd pain from peritonitis. He is s/p laparoscopic appendectomy for ruptured appendix on 12/3.  Appears appendicitis was initially dx in September but due to amount of inflammation it was conservatively treated with diet/antibiotics.  Laparoscopic drain was placed 12/5 by [facility].  Orders for PICC and start TPN 12/9.   IV team unable to place PICC at beside 12/9, thus TPN not started. Plan for IR to place PICC  Baseline prealbumin = 8.1   Electrolytes: Na = 134 (better), K=3.4, Corr Ca = 9.84 SCr: 0.78. I/O = 2545/1780, drains=130 LFTs: WNL TGs: 12/9 =120  Prealbumin: 12/9 =8.1 CBGs: none  Nutritional Goals:  RD recommendations: 1900-2250 kCal, 105-120 grams of protein per day Goal TPN will be ClinimixE 5/20 at 180m/hr to provide 100gm protein and weekly avg of 1947 Kcals (GIR = 4.451mkg/hr)  Plan:  - Plans for PICC to be placed by IR today but they did not receive the order to do it and it is unlikely they will be able to get it on their schedule  today    - D#7 zosyn, dose remains appropriate  Clovis Riley 10/21/2012,9:56 AM

## 2012-10-21 NOTE — Progress Notes (Signed)
Patient ID: Jacob [name], male   DOB: 05-12-1991, 21 y.o.   MRN: [id number] [facility] Progress Note:   5 Days Post-Op  Subjective: Mental status is clear.  Feeling better. He does not want PIC line.  They tried;  I was not notified that it wasn't in and IR wasn't contacted.  Nevertheless the patient doesn't want this intervention. Objective: Vital signs in last 24 hours: Temp:  [98.1 F (36.7 C)-98.8 F (37.1 C)] 98.8 F (37.1 C) (12/10 0600) Pulse Rate:  [62-79] 79  (12/10 0600) Resp:  [18] 18  (12/10 0600) BP: (94-119)/(63-74) 100/68 mmHg (12/10 0600) SpO2:  [95 %-99 %] 96 % (12/10 0600)  Intake/Output from previous day: 12/09 0701 - 12/10 0700 In: 2545.5 [I.V.:2095.5; IV Piggyback:450] Out: 0102 [Urine:1650; Drains:130] Intake/Output this shift: Total I/O In: 100 [IV Piggyback:100] Out: 410 [Urine:400; Drains:10]  Physical Exam: Work of breathing is normal.  Pain minimal.  JP still cloudy bile.  Lab Results:  Results for orders placed during the hospital encounter of 10/16/12 (from the past 48 hour(s))  CBC WITH DIFFERENTIAL     Status: Abnormal   Collection Time   10/20/12  4:47 AM      Component Value Range Comment   WBC 9.2  4.0 - 10.5 K/uL    RBC 4.64  4.22 - 5.81 MIL/uL    Hemoglobin 12.5 (*) 13.0 - 17.0 g/dL    HCT 37.2 (*) 39.0 - 52.0 %    MCV 80.2  78.0 - 100.0 fL    MCH 26.9  26.0 - 34.0 pg    MCHC 33.6  30.0 - 36.0 g/dL    RDW 14.7  11.5 - 15.5 %    Platelets 263  150 - 400 K/uL    Neutrophils Relative 69  43 - 77 %    Neutro Abs 6.3  1.7 - 7.7 K/uL    Lymphocytes Relative 12  12 - 46 %    Lymphs Abs 1.1  0.7 - 4.0 K/uL    Monocytes Relative 17 (*) 3 - 12 %    Monocytes Absolute 1.5 (*) 0.1 - 1.0 K/uL    Eosinophils Relative 3  0 - 5 %    Eosinophils Absolute 0.2  0.0 - 0.7 K/uL    Basophils Relative 0  0 - 1 %    Basophils Absolute 0.0  0.0 - 0.1 K/uL   BASIC METABOLIC PANEL     Status: Abnormal   Collection Time   10/20/12 11:20 AM   Component Value Range Comment   Sodium 130 (*) 135 - 145 mEq/L    Potassium 3.7  3.5 - 5.1 mEq/L    Chloride 96  96 - 112 mEq/L    CO2 22  19 - 32 mEq/L    Glucose, Bld 88  70 - 99 mg/dL    BUN 11  6 - 23 mg/dL    Creatinine, Ser 0.77  0.50 - 1.35 mg/dL    Calcium 8.9  8.4 - 10.5 mg/dL    GFR calc non Af Amer >90  >90 mL/min    GFR calc Af Amer >90  >90 mL/min   MAGNESIUM     Status: Normal   Collection Time   10/20/12 11:20 AM      Component Value Range Comment   Magnesium 2.0  1.5 - 2.5 mg/dL   PHOSPHORUS     Status: Normal   Collection Time   10/20/12 11:20 AM  Component Value Range Comment   Phosphorus 3.7  2.3 - 4.6 mg/dL   TRIGLYCERIDES     Status: Normal   Collection Time   10/20/12 11:20 AM      Component Value Range Comment   Triglycerides 120  <150 mg/dL   PREALBUMIN     Status: Abnormal   Collection Time   10/20/12 11:20 AM      Component Value Range Comment   Prealbumin 8.1 (*) 17.0 - 34.0 mg/dL   CHOLESTEROL, TOTAL     Status: Normal   Collection Time   10/21/12  4:38 AM      Component Value Range Comment   Cholesterol 148  0 - 200 mg/dL   CBC WITH DIFFERENTIAL     Status: Abnormal   Collection Time   10/21/12  4:40 AM      Component Value Range Comment   WBC 8.5  4.0 - 10.5 K/uL    RBC 4.62  4.22 - 5.81 MIL/uL    Hemoglobin 12.4 (*) 13.0 - 17.0 g/dL    HCT 37.0 (*) 39.0 - 52.0 %    MCV 80.1  78.0 - 100.0 fL    MCH 26.8  26.0 - 34.0 pg    MCHC 33.5  30.0 - 36.0 g/dL    RDW 14.6  11.5 - 15.5 %    Platelets 280  150 - 400 K/uL    Neutrophils Relative 72  43 - 77 %    Neutro Abs 6.1  1.7 - 7.7 K/uL    Lymphocytes Relative 12  12 - 46 %    Lymphs Abs 1.0  0.7 - 4.0 K/uL    Monocytes Relative 14 (*) 3 - 12 %    Monocytes Absolute 1.2 (*) 0.1 - 1.0 K/uL    Eosinophils Relative 2  0 - 5 %    Eosinophils Absolute 0.2  0.0 - 0.7 K/uL    Basophils Relative 0  0 - 1 %    Basophils Absolute 0.0  0.0 - 0.1 K/uL   COMPREHENSIVE METABOLIC PANEL     Status:  Abnormal   Collection Time   10/21/12  4:40 AM      Component Value Range Comment   Sodium 134 (*) 135 - 145 mEq/L    Potassium 3.4 (*) 3.5 - 5.1 mEq/L    Chloride 98  96 - 112 mEq/L    CO2 21  19 - 32 mEq/L    Glucose, Bld 73  70 - 99 mg/dL    BUN 10  6 - 23 mg/dL    Creatinine, Ser 0.78  0.50 - 1.35 mg/dL    Calcium 8.8  8.4 - 10.5 mg/dL    Total Protein 6.5  6.0 - 8.3 g/dL    Albumin 2.7 (*) 3.5 - 5.2 g/dL    AST 15  0 - 37 U/L    ALT 14  0 - 53 U/L    Alkaline Phosphatase 101  39 - 117 U/L    Total Bilirubin 0.3  0.3 - 1.2 mg/dL    GFR calc non Af Amer >90  >90 mL/min    GFR calc Af Amer >90  >90 mL/min   MAGNESIUM     Status: Normal   Collection Time   10/21/12  4:40 AM      Component Value Range Comment   Magnesium 2.0  1.5 - 2.5 mg/dL   PHOSPHORUS     Status: Normal   Collection Time  10/21/12  4:40 AM      Component Value Range Comment   Phosphorus 3.5  2.3 - 4.6 mg/dL     Radiology/Results: No results found.  Anti-infectives: Anti-infectives     Start     Dose/Rate Route Frequency Ordered Stop   10/17/12 1000  piperacillin-tazobactam (ZOSYN) IVPB 3.375 g       3.375 g 12.5 mL/hr over 240 Minutes Intravenous Every 8 hours 10/17/12 0842     10/16/12 2359   piperacillin-tazobactam (ZOSYN) IVPB 3.375 g  Status:  Discontinued        3.375 g 12.5 mL/hr over 240 Minutes Intravenous Every 8 hours 10/16/12 1802 10/16/12 2158   10/16/12 2200   piperacillin-tazobactam (ZOSYN) IVPB 3.375 g  Status:  Discontinued        3.375 g 12.5 mL/hr over 240 Minutes Intravenous 3 times per day 10/16/12 1338 10/16/12 1802   10/16/12 1400  piperacillin-tazobactam (ZOSYN) IVPB 3.375 g       3.375 g 100 mL/hr over 30 Minutes Intravenous STAT 10/16/12 1338 10/16/12 1705          Assessment/Plan: Problem List: Patient Active Problem List  Diagnosis  . Ruptured appendicitis-nonop management begun Sept 2013  . Obesity (BMI 30-39.9)  . 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  Plan to follow.  TNA on hold as long as he refuses PIC 5 Days Post-Op    LOS: 5 days   Matt B. Hassell Done, MD, [facility] [phone] beeper [phone]  10/21/2012 11:28 AM

## 2012-10-21 NOTE — Progress Notes (Signed)
Dr. Hassell Done on floor spoke with patient about plan of care and that will be awaiting results of ct scan.

## 2012-10-21 NOTE — Progress Notes (Signed)
Patient and Mom complaining that questions have not been answered. Alos upset about the delay in CT scan. Notified CT and MD. No other concerns voiced. MD verbalized plan to patient.

## 2012-10-21 NOTE — Progress Notes (Signed)
Beds changed out for patient.

## 2012-10-22 NOTE — Progress Notes (Signed)
Pt had been complaining about clear liquids- not filling him up. Pt's mom voiced out concern about pt's nutrients & not having enough nourishment. Pt had refused to order anything from the clear liquid menu when offered. Gatorade & chicken broth was what pt had been taking in whicht was brought in by mom. Pt had apparently refused PICC line placement for TNA after several attempts to insert it. He stated his refusal of PICC line insertion again  today as well. On call MD ( Dr Marcello Moores), called & notified of pt's desire for food upgrade, on call MD states they have to address this with Dr. Hassell Done in am and will not change Dr. Earlie Server plan. Pt & mom made aware of what MD said & were unhappy about it.

## 2012-10-22 NOTE — Progress Notes (Signed)
ANTIBIOTIC CONSULT NOTE - Follow up  Pharmacy Consult for Zosyn Indication: Intra-abdominal infection  No Known Allergies  Patient Measurements: Height: 5' 8"  (172.7 cm) Weight: 202 lb 12.8 oz (91.99 kg) IBW/kg (Calculated) : 68.4   Vital Signs: Temp: 98.2 F (36.8 C) (12/11 0629) Temp src: Oral (12/11 0629) BP: 104/62 mmHg (12/11 0629) Pulse Rate: 72  (12/11 0629)    Labs:  Basename 10/21/12 0440 10/20/12 1120 10/20/12 0447  WBC 8.5 -- 9.2  HGB 12.4* -- 12.5*  PLT 280 -- 263  LABCREA -- -- --  CREATININE 0.78 0.77 --   Estimated Creatinine Clearance: 159.4 ml/min (by C-G formula based on Cr of 0.78).   Microbiology: Recent Results (from the past 720 hour(s))  SURGICAL PCR SCREEN     Status: Abnormal   Collection Time   10/13/12 11:52 AM      Component Value Range Status Comment   MRSA, PCR NEGATIVE  NEGATIVE Final    Staphylococcus aureus POSITIVE (*) NEGATIVE Final   SURGICAL PCR SCREEN     Status: Abnormal   Collection Time   10/16/12  6:03 PM      Component Value Range Status Comment   MRSA, PCR NEGATIVE  NEGATIVE Final    Staphylococcus aureus POSITIVE (*) NEGATIVE Final    Medications:  Anti-infectives     Start     Dose/Rate Route Frequency Ordered Stop   10/17/12 1000  piperacillin-tazobactam (ZOSYN) IVPB 3.375 g       3.375 g 12.5 mL/hr over 240 Minutes Intravenous Every 8 hours 10/17/12 0842     10/16/12 2359   piperacillin-tazobactam (ZOSYN) IVPB 3.375 g  Status:  Discontinued        3.375 g 12.5 mL/hr over 240 Minutes Intravenous Every 8 hours 10/16/12 1802 10/16/12 2158   10/16/12 2200   piperacillin-tazobactam (ZOSYN) IVPB 3.375 g  Status:  Discontinued        3.375 g 12.5 mL/hr over 240 Minutes Intravenous 3 times per day 10/16/12 1338 10/16/12 1802   10/16/12 1400  piperacillin-tazobactam (ZOSYN) IVPB 3.375 g       3.375 g 100 mL/hr over 30 Minutes Intravenous STAT 10/16/12 1338 10/16/12 1705          Assessment:  D#7 Zosyn in 21  y/o M with ruptured appendicitis 07/16/12 treated in [place], s/p laparoscopic appendectomy 10/14/12 at [facility].  Now with peritonitis, s/p drain placement in OR.    Renal function stable, CrCl > 100 ml/min  Last WBC, and Scr WNL, Afebrile now  Goal of Therapy:  Eradication of infection; Adjust Zosyn dosage for estimated renal function  Plan:   Continue Zosyn 3.375g IV Q8H infused over 4hrs.  Pharmacy will sign off on Zosyn dosing  as renal function as remained stable  Kaimani Clayson, Gaye Alken PharmD Pager #: [phone] 7:33 AM 10/22/2012

## 2012-10-22 NOTE — Progress Notes (Signed)
Patient ID: Jacob [name], male   DOB: Oct 21, 1991, 21 y.o.   MRN: [id number] [facility] Progress Note:   6 Days Post-Op  Subjective: Mental status is clear Objective: Vital signs in last 24 hours: Temp:  [98.2 F (36.8 C)-98.3 F (36.8 C)] 98.2 F (36.8 C) (12/11 0629) Pulse Rate:  [72-90] 72  (12/11 0629) Resp:  [18] 18  (12/11 0629) BP: (104-122)/(62-77) 104/62 mmHg (12/11 0629) SpO2:  [96 %-99 %] 96 % (12/11 0629)  Intake/Output from previous day: 12/10 0701 - 12/11 0700 In: 1671.3 [P.O.:50; I.V.:1471.3; IV Piggyback:150] Out: 4081 [Urine:4275; Drains:30] Intake/Output this shift:    Physical Exam: Work of breathing is normal.  No abdominal pain.  JP drainage is down.    Lab Results:  Results for orders placed during the hospital encounter of 10/16/12 (from the past 48 hour(s))  BASIC METABOLIC PANEL     Status: Abnormal   Collection Time   10/20/12 11:20 AM      Component Value Range Comment   Sodium 130 (*) 135 - 145 mEq/L    Potassium 3.7  3.5 - 5.1 mEq/L    Chloride 96  96 - 112 mEq/L    CO2 22  19 - 32 mEq/L    Glucose, Bld 88  70 - 99 mg/dL    BUN 11  6 - 23 mg/dL    Creatinine, Ser 0.77  0.50 - 1.35 mg/dL    Calcium 8.9  8.4 - 10.5 mg/dL    GFR calc non Af Amer >90  >90 mL/min    GFR calc Af Amer >90  >90 mL/min   MAGNESIUM     Status: Normal   Collection Time   10/20/12 11:20 AM      Component Value Range Comment   Magnesium 2.0  1.5 - 2.5 mg/dL   PHOSPHORUS     Status: Normal   Collection Time   10/20/12 11:20 AM      Component Value Range Comment   Phosphorus 3.7  2.3 - 4.6 mg/dL   TRIGLYCERIDES     Status: Normal   Collection Time   10/20/12 11:20 AM      Component Value Range Comment   Triglycerides 120  <150 mg/dL   PREALBUMIN     Status: Abnormal   Collection Time   10/20/12 11:20 AM      Component Value Range Comment   Prealbumin 8.1 (*) 17.0 - 34.0 mg/dL   CHOLESTEROL, TOTAL     Status: Normal   Collection Time   10/21/12  4:38  AM      Component Value Range Comment   Cholesterol 148  0 - 200 mg/dL   CBC WITH DIFFERENTIAL     Status: Abnormal   Collection Time   10/21/12  4:40 AM      Component Value Range Comment   WBC 8.5  4.0 - 10.5 K/uL    RBC 4.62  4.22 - 5.81 MIL/uL    Hemoglobin 12.4 (*) 13.0 - 17.0 g/dL    HCT 37.0 (*) 39.0 - 52.0 %    MCV 80.1  78.0 - 100.0 fL    MCH 26.8  26.0 - 34.0 pg    MCHC 33.5  30.0 - 36.0 g/dL    RDW 14.6  11.5 - 15.5 %    Platelets 280  150 - 400 K/uL    Neutrophils Relative 72  43 - 77 %    Neutro Abs 6.1  1.7 - 7.7  K/uL    Lymphocytes Relative 12  12 - 46 %    Lymphs Abs 1.0  0.7 - 4.0 K/uL    Monocytes Relative 14 (*) 3 - 12 %    Monocytes Absolute 1.2 (*) 0.1 - 1.0 K/uL    Eosinophils Relative 2  0 - 5 %    Eosinophils Absolute 0.2  0.0 - 0.7 K/uL    Basophils Relative 0  0 - 1 %    Basophils Absolute 0.0  0.0 - 0.1 K/uL   COMPREHENSIVE METABOLIC PANEL     Status: Abnormal   Collection Time   10/21/12  4:40 AM      Component Value Range Comment   Sodium 134 (*) 135 - 145 mEq/L    Potassium 3.4 (*) 3.5 - 5.1 mEq/L    Chloride 98  96 - 112 mEq/L    CO2 21  19 - 32 mEq/L    Glucose, Bld 73  70 - 99 mg/dL    BUN 10  6 - 23 mg/dL    Creatinine, Ser 0.78  0.50 - 1.35 mg/dL    Calcium 8.8  8.4 - 10.5 mg/dL    Total Protein 6.5  6.0 - 8.3 g/dL    Albumin 2.7 (*) 3.5 - 5.2 g/dL    AST 15  0 - 37 U/L    ALT 14  0 - 53 U/L    Alkaline Phosphatase 101  39 - 117 U/L    Total Bilirubin 0.3  0.3 - 1.2 mg/dL    GFR calc non Af Amer >90  >90 mL/min    GFR calc Af Amer >90  >90 mL/min   MAGNESIUM     Status: Normal   Collection Time   10/21/12  4:40 AM      Component Value Range Comment   Magnesium 2.0  1.5 - 2.5 mg/dL   PHOSPHORUS     Status: Normal   Collection Time   10/21/12  4:40 AM      Component Value Range Comment   Phosphorus 3.5  2.3 - 4.6 mg/dL     Radiology/Results: Ct Abdomen Pelvis W Contrast  10/21/2012  *RADIOLOGY REPORT*  Clinical Data:  Diffuse abdominal pain, post appendectomy 10/14/2012, question fistula formation from cecum or distal small bowel  CT ABDOMEN AND PELVIS WITH CONTRAST  Technique:  Multidetector CT imaging of the abdomen and pelvis was performed following the standard protocol during bolus administration of intravenous contrast. Sagittal and coronal MPR images reconstructed from axial data set.  Contrast: 157m OMNIPAQUE IOHEXOL 300 MG/ML  SOLN Dilute oral contrast.  Comparison: 10/16/2012  Findings: Minimal atelectasis and tiny effusions at bilateral lung bases. Liver, spleen, pancreas, kidneys, and adrenal glands normal appearance. Post appendectomy with surgical drain noted in the right pelvis. Small amount of intraperitoneal and interloop fluid noted as well as scattered areas of thickening or edema within the soft tissue planes in the abdomen compatible with inflammatory process and prior surgery.  Bilobed fluid collection is seen in the upper pelvis, more superior portion 3.9 x 1.8 cm image 64 and more inferior portion 3.9 x 1.2 cm image 69, in total 4.2 cm length. This is consistent with a postoperative abscess. These are in close proximity to a thickened terminal ileal loop as well as extending very close to the tip of the thickened cecum, though I do not identify extravasated contrast to confirm presence of a fistula. Reactive lymph nodes within mesentery.  Bladder and ureters unremarkable. Stomach  and remaining bowel loops unremarkable. No mass, hernia, or free intraperitoneal air identified. Bones unremarkable.  IMPRESSION: Bilobed postoperative abscess collection in the pelvis. No definite evidence of communication to bowel is seen to suggest fistula.   Original Report Authenticated By: Lavonia Dana, M.D.     Anti-infectives: Anti-infectives     Start     Dose/Rate Route Frequency Ordered Stop   10/17/12 1000  piperacillin-tazobactam (ZOSYN) IVPB 3.375 g       3.375 g 12.5 mL/hr over 240 Minutes Intravenous Every  8 hours 10/17/12 0842     10/16/12 2359   piperacillin-tazobactam (ZOSYN) IVPB 3.375 g  Status:  Discontinued        3.375 g 12.5 mL/hr over 240 Minutes Intravenous Every 8 hours 10/16/12 1802 10/16/12 2158   10/16/12 2200   piperacillin-tazobactam (ZOSYN) IVPB 3.375 g  Status:  Discontinued        3.375 g 12.5 mL/hr over 240 Minutes Intravenous 3 times per day 10/16/12 1338 10/16/12 1802   10/16/12 1400  piperacillin-tazobactam (ZOSYN) IVPB 3.375 g       3.375 g 100 mL/hr over 30 Minutes Intravenous STAT 10/16/12 1338 10/16/12 1705          Assessment/Plan: Problem List: Patient Active Problem List  Diagnosis  . Ruptured appendicitis-nonop management begun Sept 2013  . Obesity (BMI 30-39.9)  . 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  Will offer clears today. Continue to monitor JP output.   6 Days Post-Op    LOS: 6 days   Matt B. Hassell Done, MD, [facility] [phone] beeper [phone]  10/22/2012 8:42 AM

## 2012-10-23 LAB — COMPREHENSIVE METABOLIC PANEL
AST: 16 U/L (ref 0–37)
Alkaline Phosphatase: 83 U/L (ref 39–117)
BUN: 4 mg/dL — ABNORMAL LOW (ref 6–23)
CO2: 24 mEq/L (ref 19–32)
Chloride: 101 mEq/L (ref 96–112)
Creatinine, Ser: 0.7 mg/dL (ref 0.50–1.35)
GFR calc non Af Amer: >90 mL/min (ref >90)
Potassium: 3.7 mEq/L (ref 3.5–5.1)
Total Bilirubin: 0.2 mg/dL — ABNORMAL LOW (ref 0.3–1.2)

## 2012-10-23 LAB — CBC WITH DIFFERENTIAL/PLATELET
Basophils Absolute: 0 10*3/uL (ref 0.0–0.1)
Basophils Relative: 0 % (ref 0–1)
Eosinophils Absolute: 0.2 10*3/uL (ref 0.0–0.7)
HCT: 37.7 % — ABNORMAL LOW (ref 39.0–52.0)
MCH: 26.9 pg (ref 26.0–34.0)
MCHC: 34.5 g/dL (ref 30.0–36.0)
Monocytes Absolute: 1.1 10*3/uL — ABNORMAL HIGH (ref 0.1–1.0)
Monocytes Relative: 13 % — ABNORMAL HIGH (ref 3–12)
Neutro Abs: 5.9 10*3/uL (ref 1.7–7.7)
RDW: 14.1 % (ref 11.5–15.5)

## 2012-10-23 LAB — PHOSPHORUS: Phosphorus: 2.4 mg/dL (ref 2.3–4.6)

## 2012-10-23 LAB — MAGNESIUM: Magnesium: 2 mg/dL (ref 1.5–2.5)

## 2012-10-23 MED ORDER — AMOXICILLIN-POT CLAVULANATE 875-125 MG PO TABS
1.0000 | ORAL_TABLET | Freq: Two times a day (BID) | ORAL | Status: DC
  Administered 2012-10-23 – 2012-10-25 (×5): 1 via ORAL
  Filled 2012-10-23 (×6): qty 1

## 2012-10-23 MED ORDER — FAMOTIDINE 20 MG PO TABS
20.0000 mg | ORAL_TABLET | Freq: Two times a day (BID) | ORAL | Status: DC
  Administered 2012-10-23 – 2012-10-24 (×4): 20 mg via ORAL
  Filled 2012-10-23 (×6): qty 1

## 2012-10-23 NOTE — Progress Notes (Addendum)
Nutrition Follow Up  Intervention: Diet advancement per MD. Encouraged continued excellent intake. Will monitor.   - Pt on full liquid diet, met with pt who stated he was tolerating full liquid diet with pain or nausea. Noted pt never received TPN r/t pt refusing PICC line. Noted plans for d/c tomorrow. Pt without any JP drainage output today, 41m total yesterday. Last BM yesterday. Pt without any nutritional needs.   Meds: Scheduled Meds:   . amoxicillin-clavulanate  1 tablet Oral Q12H  . heparin  5,000 Units Subcutaneous Q8H  . lip balm  1 application Topical BID  . naproxen  500 mg Oral BID WC  . vitamin C  1,000 mg Oral Daily  . [DISCONTINUED] piperacillin-tazobactam (ZOSYN)  IV  3.375 g Intravenous Q8H   Continuous Infusions:   . dextrose 5 % and 0.45 % NaCl with KCl 20 mEq/L 50 mL/hr at 10/23/12 0840   PRN Meds:.acetaminophen, acetaminophen, alum & mag hydroxide-simeth, amphetamine-dextroamphetamine, bisacodyl, diphenhydrAMINE, magic mouthwash, metoprolol tartrate, morphine injection, oxyCODONE, promethazine   CMP     Component Value Date/Time   NA 135 10/23/2012 0422   K 3.7 10/23/2012 0422   CL 101 10/23/2012 0422   CO2 24 10/23/2012 0422   GLUCOSE 105* 10/23/2012 0422   BUN 4* 10/23/2012 0422   CREATININE 0.70 10/23/2012 0422   CALCIUM 9.2 10/23/2012 0422   PROT 7.0 10/23/2012 0422   ALBUMIN 2.8* 10/23/2012 0422   AST 16 10/23/2012 0422   ALT 13 10/23/2012 0422   ALKPHOS 83 10/23/2012 0422   BILITOT 0.2* 10/23/2012 0422   GFRNONAA >90 10/23/2012 0422   GFRAA >90 10/23/2012 0422    CBG (last 3)  No results found for this basename: GLUCAP:3 in the last 72 hours   Intake/Output Summary (Last 24 hours) at 10/23/12 1238 Last data filed at 10/23/12 1000  Gross per 24 hour  Intake   2520 ml  Output   2105 ml  Net    415 ml    Weight Status:  No new weights  Estimated needs:   1900-2250 calories 105-120g protein  Nutrition Dx: Inadequate oral intake r/t  inability to eat AEB NPO - no longer appropriate as diet advanced.   New nutrition dx: Increased nutrient needs r/t fistula from cecum AEB MD notes.   Goal: TPN to meet >90% of estimated nutritional needs - not met, TPN not initiated.   New goal: Pt to consume >90% of meals.   Monitor: Weights, labs, intake, JP drainage  HMikey CollegeMS, RD, LDN [phone] [phone] Hours Pager

## 2012-10-23 NOTE — Progress Notes (Signed)
Patient ID: Jacob [name], male   DOB: 1991-02-12, 21 y.o.   MRN: [id number] [facility] Surgery Progress Note:   7 Days Post-Op  Subjective: Mental status is clear.  Taking clears.  No increase in JP drainage.  Hungry Objective: Vital signs in last 24 hours: Temp:  [97.9 F (36.6 C)-98.3 F (36.8 C)] 98 F (36.7 C) (12/12 0538) Pulse Rate:  [73-82] 80  (12/12 0538) Resp:  [18] 18  (12/12 0538) BP: (114-133)/(67-76) 114/71 mmHg (12/12 0538) SpO2:  [94 %-99 %] 96 % (12/12 0538)  Intake/Output from previous day: 12/11 0701 - 12/12 0700 In: 2583.8 [P.O.:480; I.V.:2103.8] Out: 1861 [Urine:1850; Drains:10; Stool:1] Intake/Output this shift:    Physical Exam: Work of breathing is normal.  JP minimal greenish drainage/mucous  Lab Results:  Results for orders placed during the hospital encounter of 10/16/12 (from the past 48 hour(s))  COMPREHENSIVE METABOLIC PANEL     Status: Abnormal   Collection Time   10/23/12  4:22 AM      Component Value Range Comment   Sodium 135  135 - 145 mEq/L    Potassium 3.7  3.5 - 5.1 mEq/L    Chloride 101  96 - 112 mEq/L    CO2 24  19 - 32 mEq/L    Glucose, Bld 105 (*) 70 - 99 mg/dL    BUN 4 (*) 6 - 23 mg/dL    Creatinine, Ser 0.70  0.50 - 1.35 mg/dL    Calcium 9.2  8.4 - 10.5 mg/dL    Total Protein 7.0  6.0 - 8.3 g/dL    Albumin 2.8 (*) 3.5 - 5.2 g/dL    AST 16  0 - 37 U/L    ALT 13  0 - 53 U/L    Alkaline Phosphatase 83  39 - 117 U/L    Total Bilirubin 0.2 (*) 0.3 - 1.2 mg/dL    GFR calc non Af Amer >90  >90 mL/min    GFR calc Af Amer >90  >90 mL/min   MAGNESIUM     Status: Normal   Collection Time   10/23/12  4:22 AM      Component Value Range Comment   Magnesium 2.0  1.5 - 2.5 mg/dL   PHOSPHORUS     Status: Normal   Collection Time   10/23/12  4:22 AM      Component Value Range Comment   Phosphorus 2.4  2.3 - 4.6 mg/dL   CBC WITH DIFFERENTIAL     Status: Abnormal   Collection Time   10/23/12  7:00 AM      Component Value Range  Comment   WBC 8.4  4.0 - 10.5 K/uL    RBC 4.84  4.22 - 5.81 MIL/uL    Hemoglobin 13.0  13.0 - 17.0 g/dL    HCT 37.7 (*) 39.0 - 52.0 %    MCV 77.9 (*) 78.0 - 100.0 fL    MCH 26.9  26.0 - 34.0 pg    MCHC 34.5  30.0 - 36.0 g/dL    RDW 14.1  11.5 - 15.5 %    Platelets 386  150 - 400 K/uL    Neutrophils Relative 70  43 - 77 %    Neutro Abs 5.9  1.7 - 7.7 K/uL    Lymphocytes Relative 14  12 - 46 %    Lymphs Abs 1.2  0.7 - 4.0 K/uL    Monocytes Relative 13 (*) 3 - 12 %    Monocytes  Absolute 1.1 (*) 0.1 - 1.0 K/uL    Eosinophils Relative 2  0 - 5 %    Eosinophils Absolute 0.2  0.0 - 0.7 K/uL    Basophils Relative 0  0 - 1 %    Basophils Absolute 0.0  0.0 - 0.1 K/uL     Radiology/Results: Ct Abdomen Pelvis W Contrast  10/21/2012  *RADIOLOGY REPORT*  Clinical Data: Diffuse abdominal pain, post appendectomy 10/14/2012, question fistula formation from cecum or distal small bowel  CT ABDOMEN AND PELVIS WITH CONTRAST  Technique:  Multidetector CT imaging of the abdomen and pelvis was performed following the standard protocol during bolus administration of intravenous contrast. Sagittal and coronal MPR images reconstructed from axial data set.  Contrast: 141m OMNIPAQUE IOHEXOL 300 MG/ML  SOLN Dilute oral contrast.  Comparison: 10/16/2012  Findings: Minimal atelectasis and tiny effusions at bilateral lung bases. Liver, spleen, pancreas, kidneys, and adrenal glands normal appearance. Post appendectomy with surgical drain noted in the right pelvis. Small amount of intraperitoneal and interloop fluid noted as well as scattered areas of thickening or edema within the soft tissue planes in the abdomen compatible with inflammatory process and prior surgery.  Bilobed fluid collection is seen in the upper pelvis, more superior portion 3.9 x 1.8 cm image 64 and more inferior portion 3.9 x 1.2 cm image 69, in total 4.2 cm length. This is consistent with a postoperative abscess. These are in close proximity to a  thickened terminal ileal loop as well as extending very close to the tip of the thickened cecum, though I do not identify extravasated contrast to confirm presence of a fistula. Reactive lymph nodes within mesentery.  Bladder and ureters unremarkable. Stomach and remaining bowel loops unremarkable. No mass, hernia, or free intraperitoneal air identified. Bones unremarkable.  IMPRESSION: Bilobed postoperative abscess collection in the pelvis. No definite evidence of communication to bowel is seen to suggest fistula.   Original Report Authenticated By: MLavonia Dana M.D.     Anti-infectives: Anti-infectives     Start     Dose/Rate Route Frequency Ordered Stop   10/17/12 1000  piperacillin-tazobactam (ZOSYN) IVPB 3.375 g       3.375 g 12.5 mL/hr over 240 Minutes Intravenous Every 8 hours 10/17/12 0842     10/16/12 2359   piperacillin-tazobactam (ZOSYN) IVPB 3.375 g  Status:  Discontinued        3.375 g 12.5 mL/hr over 240 Minutes Intravenous Every 8 hours 10/16/12 1802 10/16/12 2158   10/16/12 2200   piperacillin-tazobactam (ZOSYN) IVPB 3.375 g  Status:  Discontinued        3.375 g 12.5 mL/hr over 240 Minutes Intravenous 3 times per day 10/16/12 1338 10/16/12 1802   10/16/12 1400  piperacillin-tazobactam (ZOSYN) IVPB 3.375 g       3.375 g 100 mL/hr over 30 Minutes Intravenous STAT 10/16/12 1338 10/16/12 1705          Assessment/Plan: Problem List: Patient Active Problem List  Diagnosis  . Ruptured appendicitis-nonop management begun Sept 2013  . Obesity (BMI 30-39.9)  . 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  VSS.  No abdominal pain.  Hungry.   Will try full liquid diet.  Switch to oral antibiotics anticipating discharge tomorrow.  7 Days Post-Op    LOS: 7 days   Matt B. MHassell Done MD, [facility] [phone] [phone] 10/23/2012 8:29 AM

## 2012-10-24 LAB — CBC WITH DIFFERENTIAL/PLATELET
Basophils Absolute: 0 10*3/uL (ref 0.0–0.1)
Basophils Absolute: 0 10*3/uL (ref 0.0–0.1)
Basophils Relative: 0 % (ref 0–1)
Basophils Relative: 0 % (ref 0–1)
Eosinophils Absolute: 0.2 10*3/uL (ref 0.0–0.7)
Eosinophils Relative: 1 % (ref 0–5)
Hemoglobin: 13.2 g/dL (ref 13.0–17.0)
Lymphocytes Relative: 11 % — ABNORMAL LOW (ref 12–46)
MCH: 27.1 pg (ref 26.0–34.0)
MCHC: 34 g/dL (ref 30.0–36.0)
MCHC: 33.7 g/dL (ref 30.0–36.0)
MCV: 79.9 fL (ref 78.0–100.0)
Monocytes Relative: 10 % (ref 3–12)
Neutro Abs: 10.9 10*3/uL — ABNORMAL HIGH (ref 1.7–7.7)
Neutro Abs: 10.3 10*3/uL — ABNORMAL HIGH (ref 1.7–7.7)
Neutrophils Relative %: 80 % — ABNORMAL HIGH (ref 43–77)
Platelets: 414 10*3/uL — ABNORMAL HIGH (ref 150–400)
Platelets: 416 10*3/uL — ABNORMAL HIGH (ref 150–400)
RDW: 14.4 % (ref 11.5–15.5)
RDW: 14.5 % (ref 11.5–15.5)
WBC: 13.1 10*3/uL — ABNORMAL HIGH (ref 4.0–10.5)

## 2012-10-24 NOTE — Progress Notes (Signed)
Patient ID: Jacob [name], male   DOB: 03-16-1991, 21 y.o.   MRN: [id number] [facility] Surgery Progress Note:   8 Days Post-Op  Subjective: Mental status is clear.  Not complaining of pain.  No increased drainage in JP bulb Objective: Vital signs in last 24 hours: Temp:  [98.6 F (37 C)-99.2 F (37.3 C)] 98.8 F (37.1 C) (12/13 0615) Pulse Rate:  [79-93] 93  (12/13 0615) Resp:  [16-18] 18  (12/13 0615) BP: (110-141)/(68-79) 110/68 mmHg (12/13 0615) SpO2:  [96 %-99 %] 98 % (12/13 0615)  Intake/Output from previous day: 12/12 0701 - 12/13 0700 In: 1306.7 [P.O.:840; I.V.:466.7] Out: 7341 [Urine:1550; Drains:5] Intake/Output this shift:    Physical Exam: Work of breathing is normal.  No abdominal pain.  JP minimal and thick.  Stripped  Lab Results:  Results for orders placed during the hospital encounter of 10/16/12 (from the past 48 hour(s))  COMPREHENSIVE METABOLIC PANEL     Status: Abnormal   Collection Time   10/23/12  4:22 AM      Component Value Range Comment   Sodium 135  135 - 145 mEq/L    Potassium 3.7  3.5 - 5.1 mEq/L    Chloride 101  96 - 112 mEq/L    CO2 24  19 - 32 mEq/L    Glucose, Bld 105 (*) 70 - 99 mg/dL    BUN 4 (*) 6 - 23 mg/dL    Creatinine, Ser 0.70  0.50 - 1.35 mg/dL    Calcium 9.2  8.4 - 10.5 mg/dL    Total Protein 7.0  6.0 - 8.3 g/dL    Albumin 2.8 (*) 3.5 - 5.2 g/dL    AST 16  0 - 37 U/L    ALT 13  0 - 53 U/L    Alkaline Phosphatase 83  39 - 117 U/L    Total Bilirubin 0.2 (*) 0.3 - 1.2 mg/dL    GFR calc non Af Amer >90  >90 mL/min    GFR calc Af Amer >90  >90 mL/min   MAGNESIUM     Status: Normal   Collection Time   10/23/12  4:22 AM      Component Value Range Comment   Magnesium 2.0  1.5 - 2.5 mg/dL   PHOSPHORUS     Status: Normal   Collection Time   10/23/12  4:22 AM      Component Value Range Comment   Phosphorus 2.4  2.3 - 4.6 mg/dL   CBC WITH DIFFERENTIAL     Status: Abnormal   Collection Time   10/23/12  7:00 AM      Component  Value Range Comment   WBC 8.4  4.0 - 10.5 K/uL    RBC 4.84  4.22 - 5.81 MIL/uL    Hemoglobin 13.0  13.0 - 17.0 g/dL    HCT 37.7 (*) 39.0 - 52.0 %    MCV 77.9 (*) 78.0 - 100.0 fL    MCH 26.9  26.0 - 34.0 pg    MCHC 34.5  30.0 - 36.0 g/dL    RDW 14.1  11.5 - 15.5 %    Platelets 386  150 - 400 K/uL    Neutrophils Relative 70  43 - 77 %    Neutro Abs 5.9  1.7 - 7.7 K/uL    Lymphocytes Relative 14  12 - 46 %    Lymphs Abs 1.2  0.7 - 4.0 K/uL    Monocytes Relative 13 (*) 3 -  12 %    Monocytes Absolute 1.1 (*) 0.1 - 1.0 K/uL    Eosinophils Relative 2  0 - 5 %    Eosinophils Absolute 0.2  0.0 - 0.7 K/uL    Basophils Relative 0  0 - 1 %    Basophils Absolute 0.0  0.0 - 0.1 K/uL   CBC WITH DIFFERENTIAL     Status: Abnormal   Collection Time   10/24/12  4:32 AM      Component Value Range Comment   WBC 13.7 (*) 4.0 - 10.5 K/uL    RBC 4.87  4.22 - 5.81 MIL/uL    Hemoglobin 13.2  13.0 - 17.0 g/dL    HCT 38.8 (*) 39.0 - 52.0 %    MCV 79.7  78.0 - 100.0 fL    MCH 27.1  26.0 - 34.0 pg    MCHC 34.0  30.0 - 36.0 g/dL    RDW 14.4  11.5 - 15.5 %    Platelets 414 (*) 150 - 400 K/uL    Neutrophils Relative 80 (*) 43 - 77 %    Neutro Abs 10.9 (*) 1.7 - 7.7 K/uL    Lymphocytes Relative 9 (*) 12 - 46 %    Lymphs Abs 1.2  0.7 - 4.0 K/uL    Monocytes Relative 10  3 - 12 %    Monocytes Absolute 1.4 (*) 0.1 - 1.0 K/uL    Eosinophils Relative 1  0 - 5 %    Eosinophils Absolute 0.2  0.0 - 0.7 K/uL    Basophils Relative 0  0 - 1 %    Basophils Absolute 0.0  0.0 - 0.1 K/uL     Radiology/Results: No results found.  Anti-infectives: Anti-infectives     Start     Dose/Rate Route Frequency Ordered Stop   10/23/12 1000   amoxicillin-clavulanate (AUGMENTIN) 875-125 MG per tablet 1 tablet        1 tablet Oral Every 12 hours 10/23/12 0831     10/17/12 1000   piperacillin-tazobactam (ZOSYN) IVPB 3.375 g  Status:  Discontinued        3.375 g 12.5 mL/hr over 240 Minutes Intravenous Every 8 hours 10/17/12  0842 10/23/12 0841   10/16/12 2359   piperacillin-tazobactam (ZOSYN) IVPB 3.375 g  Status:  Discontinued        3.375 g 12.5 mL/hr over 240 Minutes Intravenous Every 8 hours 10/16/12 1802 10/16/12 2158   10/16/12 2200   piperacillin-tazobactam (ZOSYN) IVPB 3.375 g  Status:  Discontinued        3.375 g 12.5 mL/hr over 240 Minutes Intravenous 3 times per day 10/16/12 1338 10/16/12 1802   10/16/12 1400  piperacillin-tazobactam (ZOSYN) IVPB 3.375 g       3.375 g 100 mL/hr over 30 Minutes Intravenous STAT 10/16/12 1338 10/16/12 1705          Assessment/Plan: Problem List: Patient Active Problem List  Diagnosis  . Ruptured appendicitis-nonop management begun Sept 2013  . Obesity (BMI 30-39.9)  . 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  Patient is frustrated.  Took full liquids yesterday. He is not keen on a PIC and TNA.  Will check WBC this evening and in AM.    8 Days Post-Op    LOS: 8 days   Matt B. Hassell Done, MD, [facility] [phone] beeper [phone]  10/24/2012 8:45 AM

## 2012-10-25 DIAGNOSIS — Z9049 Acquired absence of other specified parts of digestive tract: Secondary | ICD-10-CM

## 2012-10-25 LAB — CBC WITH DIFFERENTIAL/PLATELET
Basophils Absolute: 0 10*3/uL (ref 0.0–0.1)
Basophils Relative: 0 % (ref 0–1)
HCT: 41.1 % (ref 39.0–52.0)
Lymphocytes Relative: 8 % — ABNORMAL LOW (ref 12–46)
Monocytes Absolute: 1 10*3/uL (ref 0.1–1.0)
Neutro Abs: 10.5 10*3/uL — ABNORMAL HIGH (ref 1.7–7.7)
Neutrophils Relative %: 83 % — ABNORMAL HIGH (ref 43–77)
RDW: 14.5 % (ref 11.5–15.5)
WBC: 12.6 10*3/uL — ABNORMAL HIGH (ref 4.0–10.5)

## 2012-10-25 MED ORDER — AMOXICILLIN-POT CLAVULANATE 875-125 MG PO TABS: 1.0000 | ORAL_TABLET | Freq: Two times a day (BID) | ORAL | Status: DC

## 2012-10-25 MED ORDER — OXYCODONE-ACETAMINOPHEN 5-325 MG PO TABS: 1.0000 | ORAL_TABLET | ORAL | Status: DC | PRN

## 2012-10-25 NOTE — Progress Notes (Signed)
Assessment unchanged. Pt verbalized understanding of dc instructions and JP bulb drain care. Handout and drainage record provided and explained with verbalized understanding. Demonstrated understanding of emptying and charging bulb drain. Discharged via foot per pt request accompanied by NT to front entrance to meet dad and awaiting vehicle to carry home.

## 2012-10-25 NOTE — Progress Notes (Signed)
Patient ID: Jacob [name], male   DOB: [birth year], 21 y.o.   MRN: [id number] [facility] Surgery Progress Note:   9 Days Post-Op  Subjective: Mental status is clear.  Very eager to go home.  Objective: Vital signs in last 24 hours: Temp:  [97.9 F (36.6 C)-98 F (36.7 C)] 98 F (36.7 C) (12/14 0600) Pulse Rate:  [87-98] 98  (12/14 0600) Resp:  [16-18] 18  (12/14 0600) BP: (116-122)/(73-77) 122/77 mmHg (12/14 0600) SpO2:  [98 %-99 %] 99 % (12/14 0600)  Intake/Output from previous day: 12/13 0701 - 12/14 0700 In: 240 [P.O.:240] Out: 605 [Urine:600; Drains:5] Intake/Output this shift:    Physical Exam: Work of breathing is normal.  No abdominal pain.  No appreciable JP drainage  Lab Results:  Results for orders placed during the hospital encounter of 10/16/12 (from the past 48 hour(s))  CBC WITH DIFFERENTIAL     Status: Abnormal   Collection Time   10/24/12  4:32 AM      Component Value Range Comment   WBC 13.7 (*) 4.0 - 10.5 K/uL    RBC 4.87  4.22 - 5.81 MIL/uL    Hemoglobin 13.2  13.0 - 17.0 g/dL    HCT 38.8 (*) 39.0 - 52.0 %    MCV 79.7  78.0 - 100.0 fL    MCH 27.1  26.0 - 34.0 pg    MCHC 34.0  30.0 - 36.0 g/dL    RDW 14.4  11.5 - 15.5 %    Platelets 414 (*) 150 - 400 K/uL    Neutrophils Relative 80 (*) 43 - 77 %    Neutro Abs 10.9 (*) 1.7 - 7.7 K/uL    Lymphocytes Relative 9 (*) 12 - 46 %    Lymphs Abs 1.2  0.7 - 4.0 K/uL    Monocytes Relative 10  3 - 12 %    Monocytes Absolute 1.4 (*) 0.1 - 1.0 K/uL    Eosinophils Relative 1  0 - 5 %    Eosinophils Absolute 0.2  0.0 - 0.7 K/uL    Basophils Relative 0  0 - 1 %    Basophils Absolute 0.0  0.0 - 0.1 K/uL   CBC WITH DIFFERENTIAL     Status: Abnormal   Collection Time   10/24/12  5:23 PM      Component Value Range Comment   WBC 13.1 (*) 4.0 - 10.5 K/uL    RBC 5.08  4.22 - 5.81 MIL/uL    Hemoglobin 13.7  13.0 - 17.0 g/dL    HCT 40.6  39.0 - 52.0 %    MCV 79.9  78.0 - 100.0 fL    MCH 27.0  26.0 - 34.0 pg    MCHC 33.7  30.0 - 36.0 g/dL    RDW 14.5  11.5 - 15.5 %    Platelets 416 (*) 150 - 400 K/uL    Neutrophils Relative 78 (*) 43 - 77 %    Neutro Abs 10.3 (*) 1.7 - 7.7 K/uL    Lymphocytes Relative 11 (*) 12 - 46 %    Lymphs Abs 1.4  0.7 - 4.0 K/uL    Monocytes Relative 10  3 - 12 %    Monocytes Absolute 1.3 (*) 0.1 - 1.0 K/uL    Eosinophils Relative 1  0 - 5 %    Eosinophils Absolute 0.1  0.0 - 0.7 K/uL    Basophils Relative 0  0 - 1 %    Basophils Absolute  0.0  0.0 - 0.1 K/uL   CBC WITH DIFFERENTIAL     Status: Abnormal   Collection Time   10/25/12  5:53 AM      Component Value Range Comment   WBC 12.6 (*) 4.0 - 10.5 K/uL    RBC 5.16  4.22 - 5.81 MIL/uL    Hemoglobin 14.0  13.0 - 17.0 g/dL    HCT 41.1  39.0 - 52.0 %    MCV 79.7  78.0 - 100.0 fL    MCH 27.1  26.0 - 34.0 pg    MCHC 34.1  30.0 - 36.0 g/dL    RDW 14.5  11.5 - 15.5 %    Platelets 439 (*) 150 - 400 K/uL    Neutrophils Relative 83 (*) 43 - 77 %    Neutro Abs 10.5 (*) 1.7 - 7.7 K/uL    Lymphocytes Relative 8 (*) 12 - 46 %    Lymphs Abs 1.0  0.7 - 4.0 K/uL    Monocytes Relative 8  3 - 12 %    Monocytes Absolute 1.0  0.1 - 1.0 K/uL    Eosinophils Relative 1  0 - 5 %    Eosinophils Absolute 0.1  0.0 - 0.7 K/uL    Basophils Relative 0  0 - 1 %    Basophils Absolute 0.0  0.0 - 0.1 K/uL     Radiology/Results: No results found.  Anti-infectives: Anti-infectives     Start     Dose/Rate Route Frequency Ordered Stop   10/23/12 1000   amoxicillin-clavulanate (AUGMENTIN) 875-125 MG per tablet 1 tablet        1 tablet Oral Every 12 hours 10/23/12 0831     10/17/12 1000   piperacillin-tazobactam (ZOSYN) IVPB 3.375 g  Status:  Discontinued        3.375 g 12.5 mL/hr over 240 Minutes Intravenous Every 8 hours 10/17/12 0842 10/23/12 0841   10/16/12 2359   piperacillin-tazobactam (ZOSYN) IVPB 3.375 g  Status:  Discontinued        3.375 g 12.5 mL/hr over 240 Minutes Intravenous Every 8 hours 10/16/12 1802 10/16/12 2158    10/16/12 2200   piperacillin-tazobactam (ZOSYN) IVPB 3.375 g  Status:  Discontinued        3.375 g 12.5 mL/hr over 240 Minutes Intravenous 3 times per day 10/16/12 1338 10/16/12 1802   10/16/12 1400  piperacillin-tazobactam (ZOSYN) IVPB 3.375 g       3.375 g 100 mL/hr over 30 Minutes Intravenous STAT 10/16/12 1338 10/16/12 1705          Assessment/Plan: Problem List: Patient Active Problem List  Diagnosis  . Ruptured appendicitis-nonop management begun Sept 2013  . Obesity (BMI 30-39.9)  . 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  Asymptomatic on full liquids.  Wants to go home.  Will discharge to reassess in office next week.  9 Days Post-Op    LOS: 9 days   Matt B. Hassell Done, MD, [facility] [phone] beeper [phone]  10/25/2012 9:20 AM

## 2012-10-25 NOTE — Discharge Summary (Signed)
Physician Discharge Summary  Patient ID: Jacob [name] MRN: [id number] DOB/AGE: [birth year] 21 y.o.  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  Drainage increased and patient got fever and elevated WBC associated with enteric appearing material in JP.  Pain and drainage decreased.  Patient declinced TNA.  Started on clears and advanced.  Pt wants to go home for followup in my office  Consults: none  Significant Diagnostic Studies: CT scan    Discharge Exam: Blood pressure 122/77, pulse 98, temperature 98 F (36.7 C), temperature source Oral, resp. rate 18, height 5' 8"  (1.727 m), weight 202 lb 12.8 oz (91.99 kg), SpO2 99.00%. JP with minimal discharge.  Incisions OK.  No abdominal pain  Disposition: 01-Home or Self Care  Discharge Orders    Future Appointments: Provider: Department: Dept Phone: Center:   10/31/2012 11:00 AM Pedro Earls, MD [facility], [place] [phone] None     Future Orders Please Complete By Expires   Diet general      Scheduling Instructions:   Stay on full liquid diet.  No solids   Increase activity slowly      Discharge instructions      Comments:   Empty and record amount and character of JP drainage at least once a day. May shower   Discharge wound care:      Comments:   Apply 4x4 around JP exit site after showers.  May apply Neosporin around tube exit site as well.   Call MD for:      Scheduling Instructions:   Increased abdominal pain. Increased drainage from JP especially if associated with change in character of drainage.  Chills and fever  above 100.4 orally.   Call MD for:  temperature >100.4      Call MD for:  persistant nausea and vomiting      Call MD for:  severe uncontrolled pain          Medication List     As of 10/25/2012  9:43 AM    TAKE these medications         amoxicillin-clavulanate 875-125 MG per tablet   Commonly known as: AUGMENTIN   Take 1 tablet by mouth every 12 (twelve) hours.      amphetamine-dextroamphetamine 10 MG 24 hr capsule   Commonly known as: ADDERALL XR   Take 10 mg by mouth daily before breakfast.      dextromethorphan-guaiFENesin 30-600 MG per 12 hr tablet   Commonly known as: MUCINEX DM   Take 1 tablet by mouth every 12 (twelve) hours.      fish oil-omega-3 fatty acids 1000 MG capsule   Take 1 g by mouth daily.      HYDROcodone-acetaminophen 5-325 MG per tablet   Commonly known as: NORCO/VICODIN   Take 2 tablets by mouth every 6 (six) hours as needed. Pain      ibuprofen 200 MG tablet   Commonly known as: ADVIL,MOTRIN   Take 400 mg by mouth every 6 (six) hours as needed. Pain      oxyCODONE-acetaminophen 5-325 MG per tablet   Commonly known as: PERCOCET/ROXICET  Take 1 tablet by mouth every 4 (four) hours as needed for pain.      vitamin C 1000 MG tablet   Take 1,000 mg by mouth daily.           Follow-up Information    Follow up with Pedro Earls, MD. Call on 10/29/2012.   Contact information:   [address] [place] [address] [phone]          Signed: Pedro Earls 10/25/2012, 9:43 AM

## 2012-10-28 ENCOUNTER — Telehealth (INDEPENDENT_AMBULATORY_CARE_PROVIDER_SITE_OTHER): Payer: Self-pay

## 2012-10-28 NOTE — Telephone Encounter (Signed)
Pt called the office today wondering about his appointment.  He stated that MM told him to come in on Wednesday 12/18 for a drain check but his appointment is scheduled for 12/20.  Page sent to Dr. Hassell Done for clarification.

## 2012-10-29 ENCOUNTER — Ambulatory Visit (INDEPENDENT_AMBULATORY_CARE_PROVIDER_SITE_OTHER): Payer: BC Managed Care – PPO | Admitting: Surgery

## 2012-10-29 ENCOUNTER — Telehealth (INDEPENDENT_AMBULATORY_CARE_PROVIDER_SITE_OTHER): Payer: Self-pay

## 2012-10-29 ENCOUNTER — Encounter (INDEPENDENT_AMBULATORY_CARE_PROVIDER_SITE_OTHER): Payer: Self-pay | Admitting: Surgery

## 2012-10-29 VITALS — BP 112/64 | HR 80 | Temp 97.1°F | Resp 16 | Ht 68.0 in | Wt 187.0 lb

## 2012-10-29 DIAGNOSIS — K632 Fistula of intestine: Secondary | ICD-10-CM

## 2012-10-29 NOTE — Progress Notes (Signed)
Jacob [name] and his dad came in today to review his progress. Her provided his JP drainage record which basically shows and drainage in the amount of 10-20 cc a day. He showed me what was then JP today and it appeared to be rather opaque and fluid and the volume appeared to suggest a fistula. We will go and get a CT scan of his abdomen and pelvis tomorrow and I will notify him but I think we may want to set him up for a PICC line and TNA.    Our review the CT scan in the morning and make a determination. His vital signs today showed he was afebrile with a pulse of 80. He had one temperature that was about 100 it that's all he has had.

## 2012-10-29 NOTE — Patient Instructions (Addendum)
Preparation For a Computerized Tomography Scan (CT Scan) Your caregiver has ordered a CT scan. A CT (Computerized Tomography) scan is a computerized X-ray that allows your physician to look at "X-ray slices" through your body. It is capable of giving more detailed information than a regular X-ray. It is also capable of looking at soft tissues better than a regular X-ray.  Follow your caregiver's instructions regarding eating and drinking before your scan. It may be necessary to drink a special dye the night prior to your test. This dye improves the quality of the X-ray and gives your physician more information from the test. Follow instructions carefully so your test will not have to be rescheduled. Arrive at the hospital 60 minutes early to check in or as directed. LET YOUR CAREGIVERS KNOW ABOUT:  Allergies, especially to dyes, contrast media, or iodine  Medications taken including herbs, eye drops, over the counter medications, and creams  Food or liquids consumed before the test  Use of steroids (by mouth or creams)  Previous problems with anesthetics or novocain  Possibility of pregnancy, if this applies  History of blood clots (thrombophlebitis)  History of bleeding or blood problems  Previous surgery  Previous or existing kidney problems  Other health problems PROCEDURE DURING YOUR SCAN: You may be given dye (contrast media) through an intravenous (IV) line. This may make you feel warm or give you a strange taste in your mouth. This is temporary. Other than the dye, a CT SCAN is the same as any other X-ray and will cause you no discomfort. You will lie on a table that slides into a round hole. The technologist may ask you to hold your breath for a few seconds during your scan. You may leave when the X-ray technologist says you are done. FOR YOUR COMFORT DURING THE TEST:  Relax as much as possible during the test.  Try to follow the technologists instructions to speed up the  test.  The test will usually take 30 minutes to one hour to complete. AFTER THE PROCEDURE  You may resume normal diet and activities.  Following your test the X-rays will be read by a radiologist (a specialist in looking at and diagnosing X-rays). The radiologist will give a report to your caregiver.  Call for your X-ray results as instructed by your doctor or technologist.  See your caregiver as instructed. SEEK IMMEDIATE MEDICAL CARE IF:  You develop a rash.  You have difficulty breathing.  You have any other problems or concerns. Document Released: 10/26/2000 Document Revised: 01/21/2012 Document Reviewed: 10/28/2008 [facility] Patient Information 2013 [place].

## 2012-10-29 NOTE — Telephone Encounter (Signed)
ERROR - did not call pt today

## 2012-10-30 ENCOUNTER — Telehealth (INDEPENDENT_AMBULATORY_CARE_PROVIDER_SITE_OTHER): Payer: Self-pay

## 2012-10-30 ENCOUNTER — Ambulatory Visit (HOSPITAL_COMMUNITY)
Admission: RE | Admit: 2012-10-30 | Discharge: 2012-10-30 | Disposition: A | Discharge status: Home / Self Care | Payer: BC Managed Care – PPO | Source: Ambulatory Visit | Attending: Surgery | Admitting: Surgery

## 2012-10-30 ENCOUNTER — Other Ambulatory Visit: Payer: Self-pay | Admitting: Radiology

## 2012-10-30 ENCOUNTER — Encounter (HOSPITAL_COMMUNITY): Payer: Self-pay | Admitting: Pharmacy Technician

## 2012-10-30 ENCOUNTER — Other Ambulatory Visit (INDEPENDENT_AMBULATORY_CARE_PROVIDER_SITE_OTHER): Payer: Self-pay | Admitting: Surgery

## 2012-10-30 DIAGNOSIS — K632 Fistula of intestine: Secondary | ICD-10-CM

## 2012-10-30 DIAGNOSIS — K651 Peritoneal abscess: Secondary | ICD-10-CM | POA: Insufficient documentation

## 2012-10-30 DIAGNOSIS — L0291 Cutaneous abscess, unspecified: Secondary | ICD-10-CM

## 2012-10-30 MED ORDER — IOHEXOL 300 MG/ML  SOLN
80.0000 mL | Freq: Once | INTRAMUSCULAR | Status: AC | PRN
  Administered 2012-10-30: 75 mL via INTRAVENOUS

## 2012-10-30 NOTE — Telephone Encounter (Signed)
Called pt to let him know to disregard his appt with MM on 12/20 @ 11am in lieu of his CT at [facility] @9am 

## 2012-10-31 ENCOUNTER — Encounter (INDEPENDENT_AMBULATORY_CARE_PROVIDER_SITE_OTHER): Payer: BC Managed Care – PPO | Admitting: Surgery

## 2012-10-31 ENCOUNTER — Encounter (HOSPITAL_COMMUNITY): Payer: Self-pay

## 2012-10-31 ENCOUNTER — Other Ambulatory Visit (INDEPENDENT_AMBULATORY_CARE_PROVIDER_SITE_OTHER): Payer: Self-pay | Admitting: Surgery

## 2012-10-31 ENCOUNTER — Ambulatory Visit (HOSPITAL_COMMUNITY)
Admission: RE | Admit: 2012-10-31 | Discharge: 2012-10-31 | Disposition: A | Discharge status: Home / Self Care | Payer: BC Managed Care – PPO | Source: Ambulatory Visit | Attending: Surgery | Admitting: Surgery

## 2012-10-31 ENCOUNTER — Telehealth (INDEPENDENT_AMBULATORY_CARE_PROVIDER_SITE_OTHER): Payer: Self-pay

## 2012-10-31 DIAGNOSIS — K6819 Other retroperitoneal abscess: Secondary | ICD-10-CM | POA: Insufficient documentation

## 2012-10-31 DIAGNOSIS — L0291 Cutaneous abscess, unspecified: Secondary | ICD-10-CM

## 2012-10-31 LAB — CBC
MCH: 27.4 pg (ref 26.0–34.0)
MCHC: 34.4 g/dL (ref 30.0–36.0)
MCV: 79.5 fL (ref 78.0–100.0)
Platelets: 634 10*3/uL — ABNORMAL HIGH (ref 150–400)
RDW: 13.8 % (ref 11.5–15.5)
WBC: 6.8 10*3/uL (ref 4.0–10.5)

## 2012-10-31 MED ORDER — MIDAZOLAM HCL 2 MG/2ML IJ SOLN
INTRAMUSCULAR | Status: AC
  Filled 2012-10-31: qty 6

## 2012-10-31 MED ORDER — OXYCODONE-ACETAMINOPHEN 5-325 MG PO TABS: 1.0000 | ORAL_TABLET | ORAL | Status: DC | PRN

## 2012-10-31 MED ORDER — SODIUM CHLORIDE 0.9 % IV SOLN
Freq: Once | INTRAVENOUS | Status: AC
  Administered 2012-10-31: 09:00:00 via INTRAVENOUS

## 2012-10-31 MED ORDER — FENTANYL CITRATE 0.05 MG/ML IJ SOLN
INTRAMUSCULAR | Status: AC
  Filled 2012-10-31: qty 6

## 2012-10-31 MED ORDER — OXYCODONE-ACETAMINOPHEN 5-325 MG PO TABS
1.0000 | ORAL_TABLET | ORAL | Status: DC | PRN
  Administered 2012-10-31: 1 via ORAL
  Filled 2012-10-31 (×2): qty 1

## 2012-10-31 MED ORDER — MIDAZOLAM HCL 2 MG/2ML IJ SOLN
INTRAMUSCULAR | Status: AC | PRN
  Administered 2012-10-31 (×2): 1 mg via INTRAVENOUS

## 2012-10-31 MED ORDER — FENTANYL CITRATE 0.05 MG/ML IJ SOLN
INTRAMUSCULAR | Status: AC | PRN
  Administered 2012-10-31 (×3): 50 ug via INTRAVENOUS

## 2012-10-31 NOTE — Telephone Encounter (Signed)
Called pt to let him know that his Rx will be at the front desk waiting for his mothers as well as to inform him of his appt with MM on 12/23@445p 

## 2012-10-31 NOTE — Procedures (Signed)
Technically successful CT guided drainage catheter placement into right lower abdominal quadrant yielding 10 cc of purulent material.  Samples sent to laboratory.  No immediate post procedural complications.

## 2012-10-31 NOTE — H&P (Signed)
Jacob [name] is an 21 y.o. male.   Chief Complaint: pelvic abscess HPI: Patient with recent history of laparoscopic appendectomy for ruptured appendicitis and now with pelvic abscess. He presents today for CT guided aspiration/possible drainage of abscess.  Past Medical History  Diagnosis Date  . Acute appendicitis with rupture     Past Surgical History  Procedure Date  . Cyst removed 15 years ago    Right chest  . Splinter removal 15  years ago    splinter removed, bad infection, surgery to remove fragments  . Laparoscopic appendectomy 10/14/2012    Procedure: APPENDECTOMY LAPAROSCOPIC;  Surgeon: Pedro Earls, MD;  Location: [facility];  Service: General;  Laterality: N/A;  . Laparoscopy 10/16/2012    Procedure: LAPAROSCOPY DIAGNOSTIC;  Surgeon: Pedro Earls, MD;  Location: [facility];  Service: General;  Laterality: N/A;  Placement of drain    Family History  Problem Relation Age of Onset  . Lung cancer     Social History:  reports that he has never smoked. He has never used smokeless tobacco. He reports that he drinks alcohol. He reports that he does not use illicit drugs.  Allergies: No Known Allergies  Current outpatient prescriptions:amoxicillin-clavulanate (AUGMENTIN) 875-125 MG per tablet, Take 1 tablet by mouth every 12 (twelve) hours., Disp: , Rfl: ;  amphetamine-dextroamphetamine (ADDERALL XR) 10 MG 24 hr capsule, Take 10 mg by mouth as needed. ADD, Disp: , Rfl: ;  oxyCODONE-acetaminophen (ROXICET) 5-325 MG per tablet, Take 1 tablet by mouth every 4 (four) hours as needed for pain., Disp: 30 tablet, Rfl: 0 Current facility-administered medications:0.9 %  sodium chloride infusion, , Intravenous, Once, Lavonia Drafts, PA Results for orders placed during the hospital encounter of 10/31/12  APTT      Component Value Range   aPTT 26  24 - 37 seconds  CBC      Component Value Range   WBC 6.8  4.0 - 10.5 K/uL   RBC 5.41  4.22 - 5.81 MIL/uL   Hemoglobin 14.8  13.0 - 17.0  g/dL   HCT 43.0  39.0 - 52.0 %   MCV 79.5  78.0 - 100.0 fL   MCH 27.4  26.0 - 34.0 pg   MCHC 34.4  30.0 - 36.0 g/dL   RDW 13.8  11.5 - 15.5 %   Platelets 634 (*) 150 - 400 K/uL  PROTIME-INR      Component Value Range   Prothrombin Time 12.9  11.6 - 15.2 seconds   INR 0.98  0.00 - 1.49    Ct Abdomen Pelvis W Contrast  10/30/2012  *RADIOLOGY REPORT*  Clinical Data: Follow-up abdominal abscess.  CT ABDOMEN AND PELVIS WITH CONTRAST  Technique:  Multidetector CT imaging of the abdomen and pelvis was performed following the standard protocol during bolus administration of intravenous contrast.  Contrast: 56m OMNIPAQUE IOHEXOL 300 MG/ML  SOLN  Comparison: CT scan 10/21/2012.  Findings: The lung bases are clear.  The solid abdominal organs are normal and stable.  The upper pelvic abscess has not significantly changed.  May be more well localize with a thick enhancing rim.  I do not see any findings to suggest this is a fistula is not oral contrast extravasation identified.  There is persistent fairly marked inflammation of the terminal and distal ileum.  Multiple enlarged surrounding pericecal, mesenteric and retroperitoneal lymph nodes. The post surgical drainage catheter is in unchanged position.  This courses inferior to the abscess.  No deep pelvic abscess.  The colon is unremarkable and stable.  IMPRESSION:  Persistent 5.8 x 4.3 cm abscess near the distal ileum without findings for a  fistulous communication.   Original Report Authenticated By: Marijo Sanes, M.D.     Review of Systems  Constitutional: Negative for fever and chills.  Respiratory: Negative for cough and shortness of breath.   Cardiovascular:       Mild chest discomfort felt to be anxiety induced  Gastrointestinal: Positive for nausea. Negative for vomiting and abdominal pain.  Genitourinary: Negative for dysuria and hematuria.  Musculoskeletal: Negative for back pain.  Neurological: Positive for headaches.   Endo/Heme/Allergies: Does not bruise/bleed easily.  Psychiatric/Behavioral: The patient is nervous/anxious.     Blood pressure 125/76, pulse 83, temperature 97.6 F (36.4 C), temperature source Oral, resp. rate 22, SpO2 100.00%. Physical Exam  Constitutional: He is oriented to person, place, and time. He appears well-developed and well-nourished.  Cardiovascular: Normal rate and regular rhythm.   Respiratory: Effort normal and breath sounds normal.  GI: Soft. Bowel sounds are normal.       Intact RLQ surgical drain , draining cream colored fluid; mild RLQ tenderness  Musculoskeletal: Normal range of motion. He exhibits no edema.  Neurological: He is alert and oriented to person, place, and time.     Assessment/Plan Patient with recent appendectomy; now with pelvic abscess. Plan is for CT guided aspiration/possible drainage of abscess today. Details/risks of procedure d/w pt/pt's mother with their understanding and consent.  Khalfani Weideman,D KEVIN 10/31/2012, 8:39 AM

## 2012-10-31 NOTE — Progress Notes (Signed)
Dr. Pascal Lux came by to see patient. He showed mom and patient how to flush drain. Supplies given to patient. Mom and patient able to teach back information.

## 2012-11-03 ENCOUNTER — Ambulatory Visit (INDEPENDENT_AMBULATORY_CARE_PROVIDER_SITE_OTHER): Payer: BC Managed Care – PPO | Admitting: Surgery

## 2012-11-03 DIAGNOSIS — K383 Fistula of appendix: Secondary | ICD-10-CM

## 2012-11-03 DIAGNOSIS — K389 Disease of appendix, unspecified: Secondary | ICD-10-CM

## 2012-11-03 LAB — CULTURE, ROUTINE-ABSCESS

## 2012-11-03 NOTE — Patient Instructions (Signed)
Thanks for your patience.  If you need further assistance after leaving the office, please call our office and speak with a [facility] nurse.  [phone].  If you want to leave a message for Dr. Hassell Done, please call his office phone at [phone].

## 2012-11-03 NOTE — Progress Notes (Signed)
Jacob [name] 21 y.o.  There is no height or weight on file to calculate BMI.  Patient Active Problem List  Diagnosis  . Obesity (BMI 30-39.9)  . 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 S/P interval appendectomy Dec 2013    No Known Allergies  Past Surgical History  Procedure Date  . Cyst removed 15 years ago    Right chest  . Splinter removal 15  years ago    splinter removed, bad infection, surgery to remove fragments  . Laparoscopic appendectomy 10/14/2012    Procedure: APPENDECTOMY LAPAROSCOPIC;  Surgeon: Pedro Earls, MD;  [facility];  Service: General;  Laterality: N/A;  . Laparoscopy 10/16/2012    Procedure: LAPAROSCOPY DIAGNOSTIC;  Surgeon: Pedro Earls, MD;  [facility];  Service: General;  Laterality: N/A;  Placement of drain   Default, Provider, MD 1. Fistula of appendix     IR drainage of smaller abscess last Friday.  No JP drainage since.  JP pulled. Will get repeat CT on Thursday with IR to decide about removal at that time.  Continue antibiotics Matt B. Hassell Done, MD, [facility] [phone] beeper [phone]  11/03/2012 5:14 PM

## 2012-11-04 ENCOUNTER — Telehealth (INDEPENDENT_AMBULATORY_CARE_PROVIDER_SITE_OTHER): Payer: Self-pay

## 2012-11-04 NOTE — Telephone Encounter (Signed)
Tammy at [facility] called stating she will be moving CT to [facility] or [facility] due to no Radiologist avail at their location to pull drain if Ct shows drain can be pulled. She will move location and call pts Mother. I advised Tammy I will let Magan know of this change.

## 2012-11-06 ENCOUNTER — Ambulatory Visit (HOSPITAL_COMMUNITY)
Admission: RE | Admit: 2012-11-06 | Discharge: 2012-11-06 | Disposition: A | Discharge status: Home / Self Care | Payer: PRIVATE HEALTH INSURANCE | Source: Ambulatory Visit | Attending: Surgery | Admitting: Surgery

## 2012-11-06 ENCOUNTER — Other Ambulatory Visit (INDEPENDENT_AMBULATORY_CARE_PROVIDER_SITE_OTHER): Payer: Self-pay | Admitting: Surgery

## 2012-11-06 DIAGNOSIS — K632 Fistula of intestine: Secondary | ICD-10-CM | POA: Insufficient documentation

## 2012-11-06 DIAGNOSIS — L988 Other specified disorders of the skin and subcutaneous tissue: Secondary | ICD-10-CM

## 2012-11-06 MED ORDER — IOHEXOL 300 MG/ML  SOLN: 50.0000 mL | Freq: Once | INTRAMUSCULAR | Status: AC | PRN

## 2012-11-06 MED ORDER — IOHEXOL 300 MG/ML  SOLN
100.0000 mL | Freq: Once | INTRAMUSCULAR | Status: AC | PRN
  Administered 2012-11-06: 100 mL via INTRAVENOUS

## 2012-11-10 ENCOUNTER — Other Ambulatory Visit (HOSPITAL_COMMUNITY): Payer: Self-pay | Admitting: Interventional Radiology

## 2012-11-10 DIAGNOSIS — K651 Peritoneal abscess: Secondary | ICD-10-CM

## 2012-11-11 ENCOUNTER — Ambulatory Visit (HOSPITAL_COMMUNITY)
Admission: RE | Admit: 2012-11-11 | Discharge: 2012-11-11 | Disposition: A | Discharge status: Home / Self Care | Payer: BC Managed Care – PPO | Source: Ambulatory Visit | Attending: Interventional Radiology | Admitting: Interventional Radiology

## 2012-11-11 DIAGNOSIS — K651 Peritoneal abscess: Secondary | ICD-10-CM

## 2012-11-11 NOTE — Progress Notes (Signed)
Interventional Radiology Progress Note  I saw and examined Mr. Auriemma today in Interventional Radiology.  He denies increasing pain, fever or chills.  His drainage output remains minimal.  I removed his drainage catheter without complication.   Signed,  Criselda Peaches, MD Vascular & Interventional Radiologist [facility]

## 2012-11-20 ENCOUNTER — Telehealth (INDEPENDENT_AMBULATORY_CARE_PROVIDER_SITE_OTHER): Payer: Self-pay | Admitting: General Surgery

## 2012-11-20 NOTE — Telephone Encounter (Signed)
Pt called to report he is having a sharp, burning pain in his Rt side.  He "is pretty sure" he ran fever last night (but did not take his temperature.)  Last evening he took 2 Aleve, which helped with the pain.  He admits the pain comes and goes.  We discussed the possibility of the pain being from scar tissue formation and the maturation of scar tissue.  He is in [place], but can easily return if Dr. Hassell Done needs to see him.  Please advise.

## 2012-11-26 ENCOUNTER — Telehealth (INDEPENDENT_AMBULATORY_CARE_PROVIDER_SITE_OTHER): Payer: Self-pay | Admitting: General Surgery

## 2012-11-26 NOTE — Telephone Encounter (Signed)
Pt called to request pain meds be called in as well.  Per VO Dr. Hassell Done, Hydrocodone 5/325 mg, # 30, 1-2 po Q4-6H prn pain, no refill, called to the same [facility] as his antibiotics.

## 2012-11-26 NOTE — Telephone Encounter (Signed)
Pt called from near [place], [place], to report the area where he recently had a drain had developed " a hard ball" and as now ruptured, draining bloody pus.  Denies fever; has some pain.  Paged and updated Dr. Hassell Done.  Ordered Augmentin 875 mg, # 14, 1 po BID, no refill.  Advised pt to return home for re-evaluation in clinic.  Pt advised to cleanse area well and cover with DSD to absorb drainage and get a flight back to [place].  He understands.  Will call med to [facility] in [place], [place]:  [phone] after they open (on CST.)  Pt will call back with flight information so we can plan on when he can come in to see Dr. Hassell Done.

## 2012-11-27 ENCOUNTER — Telehealth (INDEPENDENT_AMBULATORY_CARE_PROVIDER_SITE_OTHER): Payer: Self-pay | Admitting: General Surgery

## 2012-11-27 NOTE — Telephone Encounter (Signed)
Pt's mother, Mika Anastasi, called to report pt is flying back to [place] tonight to see Dr. Hassell Done tomorrow.  Advised MD will not be available due to illness, but wants him seen by MD in urgent office instead.  Appt with Dr. Marcello Moores made.  She understands.

## 2012-11-28 ENCOUNTER — Encounter (INDEPENDENT_AMBULATORY_CARE_PROVIDER_SITE_OTHER): Payer: Self-pay | Admitting: General Surgery

## 2012-11-28 ENCOUNTER — Ambulatory Visit (INDEPENDENT_AMBULATORY_CARE_PROVIDER_SITE_OTHER): Payer: PRIVATE HEALTH INSURANCE | Admitting: General Surgery

## 2012-11-28 ENCOUNTER — Telehealth (INDEPENDENT_AMBULATORY_CARE_PROVIDER_SITE_OTHER): Payer: Self-pay | Admitting: General Surgery

## 2012-11-28 VITALS — BP 123/72 | HR 72 | Temp 98.4°F | Resp 16 | Ht 68.0 in | Wt 183.6 lb

## 2012-11-28 DIAGNOSIS — L02219 Cutaneous abscess of trunk, unspecified: Secondary | ICD-10-CM

## 2012-11-28 DIAGNOSIS — L02211 Cutaneous abscess of abdominal wall: Secondary | ICD-10-CM

## 2012-11-28 DIAGNOSIS — L03319 Cellulitis of trunk, unspecified: Secondary | ICD-10-CM

## 2012-11-28 NOTE — Telephone Encounter (Signed)
Patient was seen in the urgent office on 11-28-12 and saw Dr Marcello Moores and he wanted Dr Hassell Done to call him on Monday. He stated that Dr Hassell Done has been his Doctor and wanted to see Dr Hassell Done on Monday and I told the patient that he will not be in the office but I will be happy to let him know and his nurse know that he wants to talk to Dr Hassell Done. Patient has a apt to see Dr Hulen Skains on 12-01-12 @ 3:00

## 2012-11-28 NOTE — Progress Notes (Signed)
Jacob [name] is a 22 y.o. male who is status post a lap appy on 12/5.  This was complicated by a RLQ abscess for which a CT guided drain was placed.  This was removed on 12/31 after resolution of abscess.  He now presents to the office with purulent drainage from his drain site.  He denies fevers, abd pain, nausea or vomiting.   Objective: Filed Vitals:   11/28/12 1446  BP: 123/72  Pulse: 72  Temp: 98.4 F (36.9 C)  Resp: 16    General appearance: alert, cooperative and no distress GI: normal findings: soft, non-tender RLQ drain site with purulence expressed, palpable subcutaneous mass Surgical Incision: healing well   Assessment: s/p  Patient Active Problem List  Diagnosis  . Obesity (BMI 30-39.9)  . 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 S/P interval appendectomy Dec 2013   After consent was verbally obtained.  The skin was prepped with betadine and infused with marcaine.  An incision was made with an 11 blade scalpel.  Blunt dissection was used to open in the abscess cavity.  It extended ~5cm deep and it was ~3cm wide.  The cavity was packed with iodoform guaze and covered with a dry, sterile dressing.  Plan: Return to clinic on Mon to remove guaze and recheck abscess.  He currently has no abdominal symptoms.  If he were to develop these, I believe he would need a repeat CT scan.  Right now, it appears to be a subcutaneous abscess    .Rosario Adie, [facility], [place]   11/28/2012 3:56 PM

## 2012-11-28 NOTE — Patient Instructions (Signed)
Change bandage as needed.  Leave underlying gauze in place.  Call the office if you develop fevers, worsening abdominal pain or nausea and vomiting.  Return to office on Mon for recheck

## 2012-12-01 ENCOUNTER — Ambulatory Visit (INDEPENDENT_AMBULATORY_CARE_PROVIDER_SITE_OTHER): Payer: PRIVATE HEALTH INSURANCE | Admitting: Surgery

## 2012-12-01 ENCOUNTER — Encounter (INDEPENDENT_AMBULATORY_CARE_PROVIDER_SITE_OTHER): Payer: Self-pay | Admitting: Surgery

## 2012-12-01 VITALS — BP 136/80 | HR 104 | Temp 98.1°F | Resp 16 | Ht 68.0 in | Wt 178.4 lb

## 2012-12-01 DIAGNOSIS — Z9049 Acquired absence of other specified parts of digestive tract: Secondary | ICD-10-CM

## 2012-12-01 DIAGNOSIS — Z9889 Other specified postprocedural states: Secondary | ICD-10-CM

## 2012-12-01 NOTE — Patient Instructions (Signed)
Inactivated Influenza Vaccine What You Need to Know WHY GET VACCINATED?  Influenza ("flu") is a contagious disease.  It is caused by the influenza virus, which can be spread by coughing, sneezing, or nasal secretions.  Anyone can get influenza, but rates of infection are highest among children. For most people, symptoms last only a few days. They include:  Fever or chills.  Sore throat.  Muscle aches.  Fatigue.  Cough.  Headache.  Runny or stuffy nose. Other illnesses can have the same symptoms and are often mistaken for influenza. Young children, people 41 and older, pregnant women, and people with certain health conditions, such as heart, lung or kidney disease, or a weakened immune system can get much sicker. Flu can cause high fever and pneumonia, and make existing medical conditions worse. It can cause diarrhea and seizures in children. Each year thousands of people die from influenza and even more require hospitalization. By getting flu vaccine, you can protect yourself from influenza and may also avoid spreading influenza to others. INACTIVATED INFLUENZA VACCINE There are 2 types of influenza vaccine:  Inactivated (killed) vaccine, the "flu shot", is given by injection with a needle.  Live, attenuated (weakened) influenza vaccine is sprayed into the nostrils. This vaccine is described in a separate Vaccine Information Statement. A "high-dose" inactivated influenza vaccine is available for people 8 years of age and older. Ask your doctor for more information.  Influenza viruses are always changing, so annual vaccination is recommended. Each year scientists try to match the viruses in the vaccine to those most likely to cause flu that year. Flu vaccine will not prevent disease from other viruses, including flu viruses not contained in the vaccine. It takes up to 2 weeks for protection to develop after the shot. Protection lasts about 1 year. Some inactivated influenza vaccine  contains a preservative called thimerosal. Thimerosal-free influenza vaccine is available. Ask your doctor for more information. WHO SHOULD GET INACTIVATED INFLUENZA VACCINE AND WHEN? WHO  All people 29 months of age and older should get flu vaccine.  Vaccination is especially important for people at higher risk of severe influenza and their close contacts, including healthcare personnel and close contacts of children younger than 6 months. WHEN Getting the vaccine as soon as it is available. This should provide protection if the flu season comes early. You can get the vaccine as long as illness is occurring in your community. Influenza can occur at any time, but most influenza occurs from October through May. In recent seasons, most infections have occurred in January and February. Getting vaccinated in December, or even later, will still be beneficial in most years. Adults and older children need 1 dose of influenza vaccine each year. But some children younger than 52 years of age need 2 doses to be protected. Ask your doctor. Influenza vaccine may be given at the same time as other vaccines, including pneumococcal vaccine. SOME PEOPLE SHOULD NOT GET INACTIVATED INFLUENZA VACCINE OR SHOULD WAIT  Tell your doctor if you have any severe (life-threatening) allergies, including a severe allergy to eggs. A severe allergy to any vaccine component may be a reason not to get the vaccine. Allergic reactions to influenza vaccine are rare.  Tell your doctor if you ever had a severe reaction after a dose of influenza vaccine.  Tell your doctor if you ever had Guillain-Barr syndrome (a severe paralytic illness, also called GBS). Your doctor will help you decide whether the vaccine is recommended for you.  People who are  moderately or severely ill should usually wait until they recover before getting the flu vaccine. If you are ill, talk to your doctor about whether to reschedule the vaccination. People with  a mild illness can usually get the vaccine. WHAT ARE THE RISKS FROM INACTIVATED INFLUENZA VACCINE? A vaccine, like any medicine, could possibly cause serious problems, such as severe allergic reactions. The risk of a vaccine causing serious harm, or death, is extremely small. Serious problems from inactivated influenza vaccine are very rare. The viruses in inactivated influenza vaccine have been killed, so you cannot get influenza from the vaccine. Mild problems:  Soreness, redness, or swelling where the shot was given.  Hoarseness; sore, red or itchy eyes; cough.  Fever.  Aches.  Headache.  Itching.  Fatigue. If these problems occur, they usually begin soon after the shot and last 1 to 2 days. Moderate problems: Young children who get inactivated flu vaccine and pneumococcal vaccine (PCV13) at the same time appear to be at increased risk for seizures caused by fever. Ask your doctor for more information. Tell your doctor if a child who is getting flu vaccine has ever had a seizure. Severe problems:  Life-threatening allergic reactions from vaccines are very rare. If they do occur, it is usually within a few minutes to a few hours after the shot.  In 1976, a type of inactivated influenza (swine flu) vaccine was associated with Guillan-Barr syndrome (GBS). Since then, flu vaccines have not been clearly linked to GBS. However, if there is a risk of GBS from current flu vaccines, it would be no more than 1 or 2 cases per million people vaccinated. This is much lower than the risk of severe influenza, which can be prevented by vaccination. The safety of vaccines is always being monitored. For more information, visit:  WirelessRelief.nl and  https://brown-wilson.com/ One brand of inactivated flu vaccine, called Afluria, should not be given to children 36 years of age or younger, except in special circumstances. A  related vaccine was associated with fevers and fever-related seizures in young children in [place]. Your doctor can give you more information. WHAT IF THERE IS A SEVERE REACTION? What should I look for? Any unusual condition, such as a high fever or unusual behavior. Signs of a serious allergic reaction can include difficulty breathing, hoarseness or wheezing, hives, paleness, weakness, a fast heartbeat, or dizziness. What should I do?  Call a doctor, or get the person to a doctor right away.  Tell your doctor what happened, the date and time it happened, and when the vaccination was given.  Ask your doctor, nurse, or health department to report the reaction by filing a Vaccine Adverse Event Reporting System (VAERS) form. Or, you can file this report through the VAERS website at www.vaers.SamedayNews.es or by calling (786)658-6915. VAERS does not provide medical advice. THE NATIONAL VACCINE INJURY COMPENSATION PROGRAM The National Vaccine Injury Compensation Program (VICP) was created in 1986. People who believe they may have been injured by a vaccine can learn about the program and about filing a claim by calling 778-036-7793, or visiting the [place] website at GoldCloset.com.ee [place]?  Ask your doctor. They can give you the vaccine package insert or suggest other sources of information.  Call your local or state health department.  Contact the Centers for Disease Control and Prevention (CDC):  Call 539-418-7167 (1-800-CDC-INFO) or  Visit the CDC's website at https://gibson.com/ CDC Inactivated Influenza Vaccine VIS (05/14/11) Document Released: 08/23/2006 Document Revised: 04/29/2012 Document Reviewed:  05/14/2011 ExitCare Patient Information 2013 [place].

## 2012-12-01 NOTE — Progress Notes (Signed)
Jacob [name] 22 y.o.  Body mass index is 27.13 kg/(m^2).  Patient Active Problem List  Diagnosis  . Obesity (BMI 30-39.9)  . 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 S/P interval appendectomy Dec 2013    No Known Allergies  Past Surgical History  Procedure Date  . Cyst removed 15 years ago    Right chest  . Splinter removal 15  years ago    splinter removed, bad infection, surgery to remove fragments  . Laparoscopic appendectomy 10/14/2012    Procedure: APPENDECTOMY LAPAROSCOPIC;  Surgeon: Pedro Earls, MD;  Location: [facility];  Service: General;  Laterality: N/A;  . Laparoscopy 10/16/2012    Procedure: LAPAROSCOPY DIAGNOSTIC;  Surgeon: Pedro Earls, MD;  Location: [facility];  Service: General;  Laterality: N/A;  Placement of drain  . Appendectomy    Default, Provider, MD No diagnosis found.  Last Friday, Dr. Marcello Moores opened and drained a large abscess at the site of his JP drain.  I removed the packing.  No feculent odor.  No continued drainage.  He is going to [facility] this Thursday.  Advised to get a flu shot-he said that he didn't remember getting a flu shot at the hospital.  I went over what to look for in case this represents a recurrent leak.  Advised to avoid constipation when traveling by using Miralax.  Will see him back in a few weeks or sooner.    Matt B. Hassell Done, MD, [facility] [phone] beeper [phone]  12/01/2012 4:03 PM

## 2013-01-30 ENCOUNTER — Encounter (INDEPENDENT_AMBULATORY_CARE_PROVIDER_SITE_OTHER): Payer: PRIVATE HEALTH INSURANCE | Admitting: Surgery

## 2013-02-09 ENCOUNTER — Encounter (INDEPENDENT_AMBULATORY_CARE_PROVIDER_SITE_OTHER): Payer: Self-pay | Admitting: Surgery

## 2013-03-09 ENCOUNTER — Ambulatory Visit (INDEPENDENT_AMBULATORY_CARE_PROVIDER_SITE_OTHER): Payer: PRIVATE HEALTH INSURANCE | Admitting: Internal Medicine

## 2013-03-09 ENCOUNTER — Telehealth: Payer: Self-pay

## 2013-03-09 VITALS — BP 123/76 | HR 86 | Temp 98.3°F | Resp 17 | Ht 67.5 in | Wt 198.0 lb

## 2013-03-09 DIAGNOSIS — G4700 Insomnia, unspecified: Secondary | ICD-10-CM

## 2013-03-09 DIAGNOSIS — F411 Generalized anxiety disorder: Secondary | ICD-10-CM

## 2013-03-09 MED ORDER — ALPRAZOLAM 0.5 MG PO TABS: 0.5000 mg | ORAL_TABLET | Freq: Every evening | ORAL | Status: DC | PRN

## 2013-03-09 NOTE — Patient Instructions (Addendum)
Jacob [name] [address]

## 2013-03-09 NOTE — Progress Notes (Signed)
Subjective:    Patient ID: Jacob [name], male    DOB: 1991-05-08, 22 y.o.   MRN: [id number]  HPI1st [facility] OV asking for prescriptions for Adderall and Xanax While at the [employer] for undergraduate school he was diagnosed with attention deficit disorder and anxiety. He was also diagnosed with attention deficit disorder in elementary school and took medicines intermittently. 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 He needs Xanax to fly. He also gets anxious about his schedule and occasionally has trouble sleeping thinking about all the things he is trying to juggle. He describes anxiety at least once a day and insomnia at least 4 or 5 times a week maybe more. He denies depression. At first he denies past use of any other medicines but then later says he was tried on Lexapro and perhaps another SSRI without benefit. He feels he doesn't need anything other than occasional Xanax. He denies the need for counseling. He uses Adderall every 3-4 hours during poker tournaments in order to maintain his focus and thinks this is very helpful. He describes distractibility and trouble focusing  when things get very busy. He is referred to [place] by friend Jacob [name].  Although he graduated more than a year ago he cannot name a physician who has been prescribing his medications. Jacob [name] shows only meds from Jacob [name] post appendectomy 2013  Past Medical History  Diagnosis Date  . Acute appendicitis with rupture   . Anxiety    Soc hx-denies illegal drugs or alcohol abuse    Review of Systems     Objective:   Physical Exam BP 123/76  Pulse 86  Temp(Src) 98.3 F (36.8 C) (Oral)  Resp 17  Ht 5' 7.5" (1.715 m)  Wt 198 lb (89.812 kg)  BMI 30.54 kg/m2  SpO2 97% Mood-frustrated/affect-impatient(has to fly out tomorrow) Neuro intact        Assessment & Plan:   Possible prob w/  add Insomnia--?underlying GAD  And he must undergo psychological assessment for attention deficit disorder for a medication should be provided/it is unclear to me whether he is using this drug as a stimulant to stay awake or whether he actually has attention deficit disorder  He does have insomnia related to feeling overwhelmed/anxious-for this he can use Xanax at bedtime but he is not provided Xanax for use for daytime anxiety or any sort of panic disorder because once again I cannot tell if he actually has this disorder//suggested that we use an overall medication from the SSRI category as well as Xanax once or twice a week for anxiety but he was unwilling to do this Psychological assessment should help [place] in this regard as well  Meds ordered this encounter  Medications  . DISCONTD because printer didn't work the first time: ALPRAZolam Jacob [name]) 0.5 MG tablet    Sig: Take 1 tablet (0.5 mg total) by mouth at bedtime as needed for sleep or anxiety.    Dispense:  10 tablet    Refill:  0  . DISCONTD because we had later discussion where he asked for the medication to be used specifically for insomnia and agreed to use it only at bedtime: ALPRAZolam (XANAX) 0.5 MG tablet    Sig: Take 1 tablet (0.5 mg total) by mouth at bedtime as needed for sleep or anxiety.    Dispense:  10 tablet    Refill:  5  . ALPRAZolam (XANAX) 0.5 MG tablet    Sig: Take 1 tablet (0.5 mg total) by mouth at bedtime as needed for sleep or anxiety.    Dispense:  30 tablet    Refill:  2  Followup 2-3 months after testing

## 2013-06-24 ENCOUNTER — Telehealth: Payer: Self-pay

## 2013-06-24 NOTE — Telephone Encounter (Signed)
Pt states that he called pharmacy about refilling his Xanax but they stated that he has no more refills he feels that he does have one left. He is wanting to know if something could be done about this. He states he may try an come by tonite 06-24-13 but if someone could call the patient because he states that he needs this refill now Call back number is [phone]

## 2013-06-25 NOTE — Telephone Encounter (Signed)
The OV note says it all--needs f/u

## 2013-06-25 NOTE — Telephone Encounter (Signed)
Thanks, I have called patient to advise. Left message.

## 2013-06-25 NOTE — Telephone Encounter (Signed)
You indicated in April follow up 2-3 months, please advise

## 2013-10-12 ENCOUNTER — Telehealth (INDEPENDENT_AMBULATORY_CARE_PROVIDER_SITE_OTHER): Payer: Self-pay | Admitting: *Deleted

## 2013-10-12 NOTE — Telephone Encounter (Signed)
Mother called to report that patient never came for his final PO appt due to moving to [place].  Mother states that patient is in town for the next couple of days and has told her that he is having abdominal discomfort.  Mother is concerned this is related to his surgery from 10/16/12 bc patient had an abscess following the surgery.  Mother asking for Dr. Hassell Done to refer to a PMD however I explained that if patient is only in town for a couple days it would be hard to get him in with a PMD.  Explained that he may need a work up with labs and possibly radiology so she may want to try an urgent care however mother states she doesn't believe in those.  Mother asked that a message be sent to Dr. Hassell Done to ask his opinion and ask what they should do.  Explained that I would send him a message then we can let her know when we hear back.  I did explain that Dr. Hassell Done is in surgery today so not sure when we would hear back.

## 2013-11-12 ENCOUNTER — Encounter (INDEPENDENT_AMBULATORY_CARE_PROVIDER_SITE_OTHER): Payer: Self-pay | Admitting: Surgery

## 2013-11-12 ENCOUNTER — Telehealth (INDEPENDENT_AMBULATORY_CARE_PROVIDER_SITE_OTHER): Payer: Self-pay | Admitting: Surgery

## 2013-11-12 DIAGNOSIS — Z9049 Acquired absence of other specified parts of digestive tract: Secondary | ICD-10-CM

## 2013-11-12 DIAGNOSIS — F411 Generalized anxiety disorder: Secondary | ICD-10-CM | POA: Insufficient documentation

## 2013-11-12 DIAGNOSIS — R109 Unspecified abdominal pain: Principal | ICD-10-CM

## 2013-11-12 DIAGNOSIS — G8929 Other chronic pain: Secondary | ICD-10-CM

## 2013-11-12 NOTE — Telephone Encounter (Signed)
Filed: 10/16/2012 8:50 PM Note Time: 10/16/2012 8:44 PM Status: Signed   Editor: Pedro Earls, MD (Physician)      Surgeon: Kaylyn Lim, MD, FACS  Asst: none  Anes: general  Procedure: Laparoscopy, placement of drain in pelvis  Diagnosis: 2 days post lap appendectomy with pain and fever and peritonitis  Complications: None seen. Staple line on cecum intact   Note: This dictation was prepared with Dragon/digital dictation along with [employer]. Any transcriptional errors that result from this process are unintentional.       Jacob [name]  Jan 10, 1991 [id number]  Patient Care Team: Leandrew Koyanagi, MD as PCP - General (Internal Medicine) Pedro Earls, MD as Consulting Physician (General Surgery)  This patient is a 23 y.o.male who calls today for surgical evaluation.  Reason for call:  Abdominal pain.  Wanting narcotics.  Obese male who had perforated appendicitis.  Underwent appendectomy December 2013.  Complicated by postoperative abscess with perk drainage.  He is a Electronics engineer.  Tends to travel out of town to [place].  He calls New Year's Day noting he has had intermittent abdominal pain "for a while".  It concerns him.  He wonders if he has appendicitis again.  He said he tried ibuprofen and that does not help.  He is wondering if he needs something stronger.  He is wondering someone could see him.  He does not want to go to the emergency room.  He is hoping to get some narcotics.  He gets some pain when he eats certain foods but he cannot specifically tell me what stings and off.  He claims to have daily bowel movements.  No constipation or diarrhea.  No hematochezia or melena.  No nausea or vomiting.  His appetite is down somewhat but he is tolerating food fine.  I recommend that he do clear liquids only.  I recommend he take a fiber supplement to help keep his bowels regular to make sure that  constipating stools not an issue.  I told him I cannot  call in an Rx for narcotics.  I do not think that is appropriate since we have not seen him in almost a year.  I recommend he call our office in the morning to schedule an appointment to be seen in the office soon.  He was hoping we could call him back tomorrow at his convenience.  I stressed to him that he needs to call the office himself.  I will leave a message with the office to try to followup with him in the morning as well.  I told him if pain is not under control, the only option at this point on Holiday is to come to the emergency room.  He did not sound toxic or sick leave.  I again discussed recommendations.  He expressed understanding.  Patient Active Problem List   Diagnosis Date Noted  . S/P interval appendectomy Dec 2013 10/25/2012  . 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 Obesity (BMI 30-39.9) 10/18/2012    Past Medical History  Diagnosis Date  . Acute appendicitis with rupture   . Anxiety     Past Surgical History  Procedure Laterality Date  . Cyst removed  15 years ago    Right chest  . Splinter removal  15  years ago    splinter removed, bad infection, surgery to remove fragments  . Laparoscopic appendectomy  10/14/2012    Procedure: APPENDECTOMY LAPAROSCOPIC;  Surgeon: Pedro Earls, MD;  Location: [facility];  Service: General;  Laterality: N/A;  . Laparoscopy  10/16/2012    Procedure: LAPAROSCOPY DIAGNOSTIC;  Surgeon: Pedro Earls, MD;  Location: [facility];  Service: General;  Laterality: N/A;  Placement of drain  . Appendectomy      History   Social History  . Marital Status: Single    Spouse Name: N/A    Number of Children: N/A  . Years of Education: N/A   Occupational History  . Not on file.   Social History Main Topics  . Smoking status: Never Smoker   . Smokeless tobacco: Never Used  . Alcohol Use: Yes     Comment: occasional  . Drug Use: No  . Sexual Activity: No   Other Topics Concern  . Not on file   Social History Narrative  . No narrative on  file    Family History  Problem Relation Age of Onset  . Lung cancer      Current Outpatient Prescriptions  Medication Sig Dispense Refill  . ALPRAZolam (XANAX) 0.5 MG tablet Take 1 tablet (0.5 mg total) by mouth at bedtime as needed for sleep or anxiety.  30 tablet  2  . amphetamine-dextroamphetamine (ADDERALL XR) 10 MG 24 hr capsule Take 10 mg by mouth as needed. ADD       No current facility-administered medications for this visit.     No Known Allergies  @VS @  No results found.

## 2013-11-13 ENCOUNTER — Telehealth (INDEPENDENT_AMBULATORY_CARE_PROVIDER_SITE_OTHER): Payer: Self-pay | Admitting: General Surgery

## 2013-11-13 NOTE — Telephone Encounter (Signed)
Called pt back due to knowing that Dr. Hassell Done normally works this patient in due to his traveling schedule.  The patient explained that he is still having intermittent pain for over a week in his abdomen and that it feels similar to the pain before.  I overbooked Dr. Hassell Done on 11/19/12 at 10:20 and informed the patient to come in on this day so we can look at him.  The patient also wanted to get a refill on his narcotics, he explained that our on-call Dr. Christel Mormon to reorder the medication for him.  Informed him that I would send this message to Dr. Hassell Done but that I could not guarantee that it would be filled before his appointment.

## 2013-11-13 NOTE — Telephone Encounter (Signed)
LMOM  Giving pt an appt with Dr Hassell Done for 12/02/13 arrive at 9:30/10:00 to discuss abdominal pain.

## 2013-11-13 NOTE — Telephone Encounter (Signed)
Message copied by Flossie Buffy on Fri Nov 13, 2013  2:43 PM ------      Message from: Tami Lin      Created: Fri Nov 13, 2013  9:13 AM      Regarding: Dr Carter Kitten: [phone]       Patient says he received v/m that he has an appt with Dr Hassell Done on 12/02/13 but he needs to see him sooner. Says Dr Hassell Done usually squeezes him in. Want Dr Hassell Done to call him. ------

## 2013-11-19 ENCOUNTER — Encounter (INDEPENDENT_AMBULATORY_CARE_PROVIDER_SITE_OTHER): Payer: PRIVATE HEALTH INSURANCE | Admitting: Surgery

## 2013-11-20 ENCOUNTER — Ambulatory Visit (INDEPENDENT_AMBULATORY_CARE_PROVIDER_SITE_OTHER): Payer: BC Managed Care – PPO | Admitting: Internal Medicine

## 2013-11-20 ENCOUNTER — Ambulatory Visit (INDEPENDENT_AMBULATORY_CARE_PROVIDER_SITE_OTHER): Payer: BC Managed Care – PPO | Admitting: Surgery

## 2013-11-20 ENCOUNTER — Ambulatory Visit
Admission: RE | Admit: 2013-11-20 | Discharge: 2013-11-20 | Disposition: A | Discharge status: Home / Self Care | Payer: BC Managed Care – PPO | Source: Ambulatory Visit | Attending: Surgery | Admitting: Surgery

## 2013-11-20 ENCOUNTER — Encounter (INDEPENDENT_AMBULATORY_CARE_PROVIDER_SITE_OTHER): Payer: Self-pay | Admitting: Surgery

## 2013-11-20 VITALS — BP 128/72 | HR 82 | Temp 98.0°F | Resp 18 | Ht 68.0 in | Wt 201.0 lb

## 2013-11-20 VITALS — BP 125/70 | HR 75 | Temp 98.9°F | Resp 18 | Ht 68.0 in | Wt 202.0 lb

## 2013-11-20 DIAGNOSIS — R109 Unspecified abdominal pain: Secondary | ICD-10-CM

## 2013-11-20 DIAGNOSIS — F418 Other specified anxiety disorders: Secondary | ICD-10-CM

## 2013-11-20 DIAGNOSIS — F411 Generalized anxiety disorder: Secondary | ICD-10-CM

## 2013-11-20 DIAGNOSIS — G4700 Insomnia, unspecified: Secondary | ICD-10-CM

## 2013-11-20 DIAGNOSIS — G8929 Other chronic pain: Secondary | ICD-10-CM

## 2013-11-20 MED ORDER — IOHEXOL 300 MG/ML  SOLN
100.0000 mL | Freq: Once | INTRAMUSCULAR | Status: AC | PRN
  Administered 2013-11-20: 100 mL via INTRAVENOUS

## 2013-11-20 MED ORDER — IOHEXOL 300 MG/ML  SOLN
30.0000 mL | Freq: Once | INTRAMUSCULAR | Status: AC | PRN
  Administered 2013-11-20: 30 mL via ORAL

## 2013-11-20 MED ORDER — ALPRAZOLAM 0.5 MG PO TABS: 0.5000 mg | ORAL_TABLET | Freq: Two times a day (BID) | ORAL | Status: DC | PRN

## 2013-11-20 NOTE — Progress Notes (Signed)
Jacob [name] 23 y.o.  Body mass index is 30.57 kg/(m^2).  Patient Active Problem List   Diagnosis Date Noted  . Chronic abdominal pain 11/12/2013  . Anxiety state, unspecified 11/12/2013  . S/P interval appendectomy Dec 2013 10/25/2012  . Obesity (BMI 30-39.9) 10/18/2012    No Known Allergies  Past Surgical History  Procedure Laterality Date  . Cyst removed  15 years ago    Right chest  . Splinter removal  15  years ago    splinter removed, bad infection, surgery to remove fragments  . Laparoscopic appendectomy  10/14/2012    Procedure: APPENDECTOMY LAPAROSCOPIC;  Surgeon: Pedro Earls, MD;  Location: [facility];  Service: General;  Laterality: N/A;  . Laparoscopy  10/16/2012    Procedure: LAPAROSCOPY DIAGNOSTIC;  Surgeon: Pedro Earls, MD;  Location: [facility];  Service: General;  Laterality: N/A;  Placement of drain  . Appendectomy     DOOLITTLE, ROBERT P, MD No diagnosis found.  Has had [name] bouts of left-sided abdominal pain and cramping while on the road. He is concern and we never really followed up from his stump leak. He is in any frank leakage but he was wondering about a followup CT scan to assess. I discussed the pros and cons of this to think 2 [place] his anxiety I think it would be good to go ahead and get a CT scan abdomen pelvis. Will likely followup by phone depending on the outcome of that Matt B. Hassell Done, MD, [facility] [phone] beeper [phone]  11/20/2013 12:39 PM

## 2013-11-20 NOTE — Progress Notes (Signed)
Subjective:    Patient ID: Jacob [name], male    DOB: 1990/12/03, 23 y.o.   MRN: [id number] This chart was scribed for Tami Lin, MD by Anastasia Pall, ED Scribe. This patient was seen in room 12 and the patient's care was started at 5:23 PM.  Chief Complaint  Patient presents with   Medication Refill    xanax    HPI [name] is a 23 y.o. male who presents to the [facility] requesting med refill.   He reports he has bad anxiety due to a recent relationship with a girl, still ongoing. He reports that the issue was in part due to a pregnancy, and having differing opinions about what to do. He reports the most recent anxiety has gone on for about 2 weeks.  He states the main issue is his anxiety and stress. He reports associated trouble sleeping and diaphoresis due to the stress. He is requesting information about appropriate medications.  He was taking Xanax last year briefly for insomnia it was due to stress and occasionally for anxiety when he would have a panic attack. He never followed up with counseling suggestions. This problem is apparently coming on over several years.  He reports h/o taking depression medicine before, with out significant relief, but states he doesn't think he needs depression medication. The medicine made him sleepy. He doesn't feel depressed. He can't identify other specific sources of anxiety.  He denies any other problems. He reports eating and drinking normally. He denies any unexpected weight loss. He denies medical history ongoing illnesses. He reports his parents are healthy. 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    PCP - Leandrew Koyanagi, MD  Patient Active Problem List   Diagnosis Date Noted   Chronic abdominal pain 11/12/2013   Anxiety state, unspecified 11/12/2013   S/P interval appendectomy Dec 2013 10/25/2012   Obesity (BMI 30-39.9) 10/18/2012   Review of Systems  Constitutional: Negative for appetite change and unexpected weight  change.  HENT: Negative.   Respiratory: Negative.   Cardiovascular: Negative.   Gastrointestinal: Negative.   Psychiatric/Behavioral: Positive for sleep disturbance and dysphoric mood. Negative for suicidal ideas, behavioral problems, confusion, self-injury and decreased concentration. The patient is nervous/anxious.       Objective:   Physical Exam  Nursing note and vitals reviewed. Constitutional: He is oriented to person, place, and time. He appears well-developed and well-nourished. No distress.  HENT:  Head: Normocephalic and atraumatic.  Eyes: EOM are normal.  Neck: Neck supple.  Cardiovascular: Normal rate.   Pulmonary/Chest: Effort normal. No respiratory distress.  Musculoskeletal: Normal range of motion.  Neurological: He is alert and oriented to person, place, and time.  Skin: Skin is warm and dry.  Psychiatric: His behavior is normal.    BP 125/70   Pulse 75   Temp(Src) 98.9 F (37.2 C)   Resp 18   Ht 5' 8"  (1.727 m)   Wt 202 lb (91.627 kg)   BMI 30.72 kg/m2   SpO2 99%     Assessment & Plan:   1. Situational anxiety   2. Insomnia, unspecified    referred to "overcoming anxiety for dummies" Discussed the need to limit intake of benzodiazepines/the need for further medication if he needs this more than 2 or 3 times a week/how much I think he would be helped by cognitive behavioral therapy He is resistant to starting another SSRI or any other drug that he has to take on a daily basis  Meds ordered this encounter  Medications   ALPRAZolam (XANAX) 0.5 MG tablet    Sig: Take 1 tablet (0.5 mg total) by mouth 2 (two) times daily as needed for anxiety or sleep.    Dispense:  60 tablet    Refill:  2   Followup three-month    I have completed the patient encounter in its entirety as documented by the scribe, with editing by me where necessary. Robert P. Laney Pastor, M.D.

## 2013-11-24 ENCOUNTER — Telehealth (INDEPENDENT_AMBULATORY_CARE_PROVIDER_SITE_OTHER): Payer: Self-pay

## 2013-11-24 NOTE — Telephone Encounter (Signed)
Pt calling requesting his CT results that he had done last week. Please call pt. I advised pt that Dr Hassell Done would be in the office tomorrow pm so hopefully we could get the results reviewed and get back in touch with him. The pt understands.

## 2013-11-25 NOTE — Telephone Encounter (Signed)
Patient is asking for a CT results, advised him DR. Hassell Done will be reviewing the Ct and we will call him with his recommendations

## 2013-11-27 ENCOUNTER — Encounter: Payer: Self-pay | Admitting: Physician Assistant

## 2013-11-27 ENCOUNTER — Other Ambulatory Visit (INDEPENDENT_AMBULATORY_CARE_PROVIDER_SITE_OTHER): Payer: Self-pay | Admitting: Surgery

## 2013-11-27 DIAGNOSIS — K5090 Crohn's disease, unspecified, without complications: Secondary | ICD-10-CM

## 2013-11-27 NOTE — Telephone Encounter (Signed)
Called Jacob [name] and he has an apt to see Moss Mc on 12-03-2013 @ 9:15 for a 9:30. Patient is aware of the apt

## 2013-11-27 NOTE — Telephone Encounter (Signed)
Dr. Hassell Done called to request a GI consult be placed for this patient for possible Crohn's.

## 2013-12-02 ENCOUNTER — Encounter (INDEPENDENT_AMBULATORY_CARE_PROVIDER_SITE_OTHER): Payer: PRIVATE HEALTH INSURANCE | Admitting: Surgery

## 2013-12-03 ENCOUNTER — Encounter: Payer: Self-pay | Admitting: Physician Assistant

## 2013-12-03 ENCOUNTER — Other Ambulatory Visit (INDEPENDENT_AMBULATORY_CARE_PROVIDER_SITE_OTHER): Payer: BC Managed Care – PPO

## 2013-12-03 ENCOUNTER — Ambulatory Visit (INDEPENDENT_AMBULATORY_CARE_PROVIDER_SITE_OTHER): Payer: BC Managed Care – PPO | Admitting: Physician Assistant

## 2013-12-03 VITALS — BP 100/60 | HR 66 | Ht 67.5 in | Wt 207.2 lb

## 2013-12-03 DIAGNOSIS — R1084 Generalized abdominal pain: Secondary | ICD-10-CM

## 2013-12-03 DIAGNOSIS — K219 Gastro-esophageal reflux disease without esophagitis: Secondary | ICD-10-CM

## 2013-12-03 DIAGNOSIS — R9389 Abnormal findings on diagnostic imaging of other specified body structures: Secondary | ICD-10-CM

## 2013-12-03 LAB — CBC WITH DIFFERENTIAL/PLATELET
Basophils Absolute: 0 10*3/uL (ref 0.0–0.1)
Basophils Relative: 0.6 % (ref 0.0–3.0)
Eosinophils Absolute: 0.2 10*3/uL (ref 0.0–0.7)
Eosinophils Relative: 3.1 % (ref 0.0–5.0)
HEMATOCRIT: 49.8 % (ref 39.0–52.0)
HEMOGLOBIN: 17.2 g/dL — AB (ref 13.0–17.0)
Lymphocytes Relative: 22.5 % (ref 12.0–46.0)
Lymphs Abs: 1.5 10*3/uL (ref 0.7–4.0)
MCHC: 34.5 g/dL (ref 30.0–36.0)
MCV: 86.3 fl (ref 78.0–100.0)
Monocytes Absolute: 0.5 10*3/uL (ref 0.1–1.0)
Monocytes Relative: 6.7 % (ref 3.0–12.0)
Neutro Abs: 4.5 10*3/uL (ref 1.4–7.7)
Neutrophils Relative %: 67.1 % (ref 43.0–77.0)
PLATELETS: 291 10*3/uL (ref 150.0–400.0)
RBC: 5.77 Mil/uL (ref 4.22–5.81)
RDW: 13.2 % (ref 11.5–14.6)
WBC: 6.8 10*3/uL (ref 4.5–10.5)

## 2013-12-03 LAB — HIGH SENSITIVITY CRP: CRP HIGH SENSITIVITY: 4.68 mg/L (ref 0.000–5.000)

## 2013-12-03 LAB — SEDIMENTATION RATE: Sed Rate: 6 mm/hr (ref 0–22)

## 2013-12-03 MED ORDER — TRAMADOL HCL 50 MG PO TABS: 50.0000 mg | ORAL_TABLET | Freq: Four times a day (QID) | ORAL | Status: DC | PRN

## 2013-12-03 MED ORDER — MOVIPREP 100 G PO SOLR: 1.0000 | ORAL | Status: DC

## 2013-12-03 MED ORDER — PANTOPRAZOLE SODIUM 40 MG PO TBEC: 40.0000 mg | DELAYED_RELEASE_TABLET | Freq: Every day | ORAL | Status: DC

## 2013-12-03 NOTE — Progress Notes (Addendum)
Subjective:    Patient ID: Jacob [name], male    DOB: [birth year], 23 y.o.   MRN: [id number]  HPI  Nox is a 23  Yo white male, new to GI and referred by Dr. Kaylyn Lim. Patient had presented in the fall of 2013 with a perforated appendicitis and CT scan done in September of 2013 which showed  appendicitis as well as some inflammation of the adjacent cecum and terminal ileum. Patient was treated with a course of antibiotics and then underwent laparoscopic appendectomy in December of 2013. His course was complicated by stump leak and he had a second surgery for peritonitis a couple of days later. Patient states that ever since that time he has had some residual abdominal pain. Over the past several months he has been having more frequent abdominal pain occurring usually early in the morning's. He describes this as  bilateral and mid abdomen. He has also had some intermittent problems with diarrhea and says that he generally has at least 3 bowel movements per day. He has seen some very small amounts of blood periodically. His appetite has been up and down but his weight overall has been stable. He also mentions that he is having frequent episodes in the morning of what sounds like sour brash with gagging and bringing up yellow bile. He says is soft and occurs when he's pressure and his teeth. He also states that he has had frequent heartburn and indigestion.  On further questioning he and courses fatigue and generalized joint aching and "tightness"  He had a recent visit with Dr. Hassell Done and then subsequent CT scan of the abdomen and pelvis was ordered for 11/20/2013. He is noted to have abnormal thickening of the terminal ileum and mild narrowing of the lumen over about a 5/2 cm segment. This felt suspicious for inflammatory bowel  disease.  Catheter report from appendectomy reviewed and did not mention any concerns regarding inflammatory bowel disease.    Review of Systems  Constitutional:  Negative.   HENT: Negative.   Eyes: Negative.   Respiratory: Negative.   Cardiovascular: Negative.   Gastrointestinal: Positive for nausea and abdominal pain.  Endocrine: Negative.   Genitourinary: Negative.   Musculoskeletal: Negative.   Skin: Negative.   Allergic/Immunologic: Negative.   Neurological: Negative.   Hematological: Negative.   Psychiatric/Behavioral: Negative.    Outpatient Prescriptions Prior to Visit  Medication Sig Dispense Refill  . ALPRAZolam (XANAX) 0.5 MG tablet Take 1 tablet (0.5 mg total) by mouth 2 (two) times daily as needed for anxiety or sleep.  60 tablet  2   No facility-administered medications prior to visit.   No Known Allergies Patient Active Problem List   Diagnosis Date Noted  . Chronic abdominal pain 11/12/2013  . Anxiety state, unspecified 11/12/2013  . S/P interval appendectomy Dec 2013 10/25/2012  . Obesity (BMI 30-39.9) 10/18/2012   History  Substance Use Topics  . Smoking status: Never Smoker   . Smokeless tobacco: Never Used  . Alcohol Use: Yes     Comment: occasional   family history includes Cancer in his maternal grandmother and paternal grandfather; Diabetes in his paternal grandfather. There is no history of Colon cancer.     Objective:   Physical Exam well-developed young white male in no acute distress, pleasant blood pressure 100/60 pulse 66 height 5 foot 7 weight 207. HEENT; nontraumatic normocephalic EOMI PERRLA sclera anicteric, Supple; no JVD, Cardiovascular 8 regular rate and rhythm with S1-S2 no murmur or gallop, Pulmonary;  and clear bilaterally, Abdomen; soft has some mild bilateral lower quadrant tenderness no guarding or rebound no palpable mass or hepatosplenomegaly bowel sounds are present, Rectal; exam not done, Extremities no clubbing cyanosis or edema skin warm and dry, Psych; mood and affect appropriate     ASSESSMENT/PLAN;      #5  23 year old male with history of perforated appendicitis fall 2013  treated initially with antibiotics and then underwent interval laparoscopic appendectomy December 4001 which was complicated by stump leak with peritonitis and required a second surgery 48 hours later. He was noted to have a fistula to the cecum. Patient now with several month history of intermittent midabdominal pain most prevalent in the mornings and increased stool frequency. CT scan done earlier this month shows abnormal thickening of the terminal ileum concerning for underlying inflammatory bowel disease. Rule out Crohn's #2 GERD  Plan; CBC with differential, sedimentation rate and CRP Schedule for colonoscopy with intubation of the terminal ileum with Dr. Pyrtle-procedure discussed in detail with patient he is agreeable to proceed. Start Protonix 40 mg by mouth every morning Ultram 50 mg every 6-8 hours when necessary pain #40 no refills  Addendum: Reviewed and agree with initial management. Jerene Bears, MD

## 2013-12-03 NOTE — Patient Instructions (Signed)
Please go to the basement level to have your labs drawn.   Prescriptions sent to your pharmacy:  1. Ultram 2.Protonix 3. Moviprep  You have been scheduled for a colonoscopy with propofol. Please follow written instructions given to you at your visit today.  Please pick up your prep kit at the pharmacy within the next 1-3 days. If you use inhalers (even only as needed), please bring them with you on the day of your procedure.

## 2013-12-04 ENCOUNTER — Ambulatory Visit (AMBULATORY_SURGERY_CENTER): Payer: BC Managed Care – PPO | Admitting: Internal Medicine

## 2013-12-04 ENCOUNTER — Encounter: Payer: Self-pay | Admitting: Internal Medicine

## 2013-12-04 VITALS — BP 125/65 | HR 78 | Temp 97.7°F | Resp 21 | Ht 67.5 in | Wt 207.0 lb

## 2013-12-04 DIAGNOSIS — K5289 Other specified noninfective gastroenteritis and colitis: Secondary | ICD-10-CM

## 2013-12-04 DIAGNOSIS — K5000 Crohn's disease of small intestine without complications: Secondary | ICD-10-CM

## 2013-12-04 DIAGNOSIS — R933 Abnormal findings on diagnostic imaging of other parts of digestive tract: Secondary | ICD-10-CM

## 2013-12-04 DIAGNOSIS — R1084 Generalized abdominal pain: Secondary | ICD-10-CM

## 2013-12-04 DIAGNOSIS — D126 Benign neoplasm of colon, unspecified: Secondary | ICD-10-CM

## 2013-12-04 DIAGNOSIS — K529 Noninfective gastroenteritis and colitis, unspecified: Secondary | ICD-10-CM

## 2013-12-04 HISTORY — PX: COLONOSCOPY: SHX174

## 2013-12-04 MED ORDER — SODIUM CHLORIDE 0.9 % IV SOLN: 500.0000 mL | INTRAVENOUS | Status: DC

## 2013-12-04 MED ORDER — ENTOCORT EC 3 MG PO CP24: 9.0000 mg | ORAL_CAPSULE | Freq: Every day | ORAL | Status: DC

## 2013-12-04 NOTE — Op Note (Addendum)
[place]  [address]. [place], [address]   COLONOSCOPY PROCEDURE REPORT  PATIENT: Jacob [name], Jacob [name]  MR#: [id number] BIRTHDATE: 1990-12-07 , 22  yrs. old GENDER: Male ENDOSCOPIST: Jerene Bears, MD PROCEDURE DATE:  12/04/2013 PROCEDURE:   Colonoscopy with biopsy and Colonoscopy with cold biopsy polypectomy First Screening Colonoscopy - Avg.  risk and is 50 yrs.  old or older - No.  Prior Negative Screening - Now for repeat screening. N/A  History of Adenoma - Now for follow-up colonoscopy & has been > or = to 3 yrs.  N/A  Polyps Removed Today? Yes. ASA CLASS:   Class II INDICATIONS:an abnormal CT and Abdominal pain.   Loose stools MEDICATIONS: MAC sedation, administered by CRNA and propofol (Diprivan) 352m IV  DESCRIPTION OF PROCEDURE:   After the risks benefits and alternatives of the procedure were thoroughly explained, informed consent was obtained.  A digital rectal exam revealed no rectal mass.   The LB [id number] endoscope was introduced through the anus and advanced to the terminal ileum which was intubated for a short distance. No adverse events experienced.   The quality of the prep was good, using MoviPrep  The instrument was then slowly withdrawn as the colon was fully examined.   COLON FINDINGS: Moderate ileitis characterized by erythema, erosion, edema and punctate ulceration was found in the terminal ileum. There was inflammation at the IC valve with mild narrowing, but able to be traversed by adult colonoscope.  Normal appearing cecal mucosa.  Multiple biopsies of the area were performed using cold forceps.   The colonic mucosa appeared normal throughout the entire examined colon.  Multiple random biopsies of the right and transverse colon were performed to rule out IBD.  Small, 3 mm, sessile polyp in the sigmoid colon removed and retrieved completely with cold biopsy polypectomy. Retroflexed views revealed no abnormalities. The time  to cecum=2 minutes 42 seconds.  Withdrawal time=12 minutes 15 seconds.  The scope was withdrawn and the procedure completed.  COMPLICATIONS: There were no complications.     ENDOSCOPIC IMPRESSION: 1.   Moderate erosion and ileitis was found in the terminal ileum; multiple biopsies of the area were performed using cold forceps; suspected Crohn's disease 2.   The colonic mucosa appeared normal throughout the entire examined colon; multiple random biopsies  were performed  RECOMMENDATIONS: 1.  Avoid all NSAIDs 2.  Await biopsy results 3.  Office follow-up appointment with me in 2-3 weeks   eSigned:  JJerene Bears MD 12/04/2013 2:40 PM Revised: 12/04/2013 2:40 PM  cc: The Patient, MJohnathan Hausen MD, and RTami Lin MD   PATIENT NAME:  [name], [name]

## 2013-12-04 NOTE — Patient Instructions (Addendum)
YOU HAD AN ENDOSCOPIC PROCEDURE TODAY AT [facility]: Refer to the procedure report that was given to you for any specific questions about what was found during the examination.  If the procedure report does not answer your questions, please call your gastroenterologist to clarify.  If you requested that your care partner not be given the details of your procedure findings, then the procedure report has been included in a sealed envelope for you to review at your convenience later.  YOU SHOULD EXPECT: Some feelings of bloating in the abdomen. Passage of more gas than usual.  Walking can help get rid of the air that was put into your GI tract during the procedure and reduce the bloating. If you had a lower endoscopy (such as a colonoscopy or flexible sigmoidoscopy) you may notice spotting of blood in your stool or on the toilet paper. If you underwent a bowel prep for your procedure, then you may not have a normal bowel movement for a few days.  DIET: Your first meal following the procedure should be a light meal and then it is ok to progress to your normal diet.  A half-sandwich or bowl of soup is an example of a good first meal.  Heavy or fried foods are harder to digest and may make you feel nauseous or bloated.  Likewise meals heavy in dairy and vegetables can cause extra gas to form and this can also increase the bloating.  Drink plenty of fluids but you should avoid alcoholic beverages for 24 hours.  ACTIVITY: Your care partner should take you home directly after the procedure.  You should plan to take it easy, moving slowly for the rest of the day.  You can resume normal activity the day after the procedure however you should NOT DRIVE or use heavy machinery for 24 hours (because of the sedation medicines used during the test).    SYMPTOMS TO REPORT IMMEDIATELY: A gastroenterologist can be reached at any hour.  During normal business hours, 8:30 AM to 5:00 PM Monday through Friday,  call [phone].  After hours and on weekends, please call the GI answering service at [phone] who will take a message and have the physician on call contact you.   Following lower endoscopy (colonoscopy or flexible sigmoidoscopy):  Excessive amounts of blood in the stool  Significant tenderness or worsening of abdominal pains  Swelling of the abdomen that is new, acute  Fever of 100F or higher  FOLLOW UP: If any biopsies were taken you will be contacted by phone or by letter within the next 1-3 weeks.  Call your gastroenterologist if you have not heard about the biopsies in 3 weeks.  Our staff will call the home number listed on your records the next business day following your procedure to check on you and address any questions or concerns that you may have at that time regarding the information given to you following your procedure. This is a courtesy call and so if there is no answer at the home number and we have not heard from you through the emergency physician on call, we will assume that you have returned to your regular daily activities without incident.  SIGNATURES/CONFIDENTIALITY: You and/or your care partner have signed paperwork which will be entered into your electronic medical record.  These signatures attest to the fact that that the information above on your After Visit Summary has been reviewed and is understood.  Full responsibility of the confidentiality of this  discharge information lies with you and/or your care-partner.  Recommendations Avoid all NSAIDS Await biopsy results Office follow up appointment next Friday, January 30th at 11:15am.

## 2013-12-04 NOTE — Progress Notes (Signed)
Procedure ends, to recovery, report given and VSS. 

## 2013-12-04 NOTE — Progress Notes (Signed)
Called to room to assist during endoscopic procedure.  Patient ID and intended procedure confirmed with present staff. Received instructions for my participation in the procedure from the performing physician.  

## 2013-12-07 ENCOUNTER — Telehealth: Payer: Self-pay | Admitting: *Deleted

## 2013-12-07 NOTE — Telephone Encounter (Signed)
  Follow up Call-  Call back number 12/04/2013  Post procedure Call Back phone  # [phone]  Permission to leave phone message Yes     Patient questions:  Do you have a fever, pain , or abdominal swelling? no Pain Score  0 *  Have you tolerated food without any problems? yes  Have you been able to return to your normal activities? yes  Do you have any questions about your discharge instructions: Diet   no Medications  no Follow up visit  no  Do you have questions or concerns about your Care? no  Actions: * If pain score is 4 or above: No action needed, pain <4.

## 2013-12-10 ENCOUNTER — Encounter: Payer: Self-pay | Admitting: Internal Medicine

## 2013-12-11 ENCOUNTER — Ambulatory Visit (INDEPENDENT_AMBULATORY_CARE_PROVIDER_SITE_OTHER): Payer: BC Managed Care – PPO | Admitting: Internal Medicine

## 2013-12-11 ENCOUNTER — Encounter: Payer: Self-pay | Admitting: Internal Medicine

## 2013-12-11 ENCOUNTER — Ambulatory Visit: Payer: BC Managed Care – PPO

## 2013-12-11 VITALS — BP 122/80 | HR 72 | Ht 67.5 in | Wt 203.2 lb

## 2013-12-11 DIAGNOSIS — K529 Noninfective gastroenteritis and colitis, unspecified: Secondary | ICD-10-CM

## 2013-12-11 DIAGNOSIS — R1013 Epigastric pain: Secondary | ICD-10-CM

## 2013-12-11 DIAGNOSIS — K3189 Other diseases of stomach and duodenum: Secondary | ICD-10-CM

## 2013-12-11 DIAGNOSIS — K5289 Other specified noninfective gastroenteritis and colitis: Secondary | ICD-10-CM

## 2013-12-11 DIAGNOSIS — K5000 Crohn's disease of small intestine without complications: Secondary | ICD-10-CM

## 2013-12-11 MED ORDER — ENTOCORT EC 3 MG PO CP24: 9.0000 mg | ORAL_CAPSULE | Freq: Every day | ORAL | Status: DC

## 2013-12-11 NOTE — Progress Notes (Signed)
Subjective:    Patient ID: Jacob [name], male    DOB: 23-Mar-1991, 23 y.o.   MRN: [id number]  HPI Jacob [name] is a 23 yo male with PMH of perforated appendicitis status post appendectomy in December 7416 with complicated postoperative course postop leak requiring second surgery 2 days later, he was seen recently by Nicoletta Ba, PA-C to evaluate residual abdominal discomfort and abnormal GI imaging. He came for colonoscopy on 12/04/2013 which revealed moderate ileitis with erosions, edema and punctate ulcerations. Biopsies were performed. His entire colon was normal. He has a sessile polyp removed from the sigmoid which was hyperplastic. Random biopsies of the right colon were normal. Biopsies from the ileum revealed active ileitis with ulceration felt to be consistent with IBD versus NSAID related inflammation.  After his colonoscopy he was started on budesonide 9 mg and pantoprazole 40 mg or indigestion. He returns for followup to discuss pathology findings.  Overall he reports he is doing fairly well. He reports that he's been living with some degree of abdominal discomfort for years. He reports taking this was normal for him. He does think over the last few days his abdominal discomfort, which he specifically denies as pain, is somewhat better. He reports that bowel movements have been perhaps a little more normal, though he has never had diarrhea. No fevers or chills. Indigestion is some better with pantoprazole but he does still have some occasional regurgitation. No nausea or vomiting. No fevers or chills. No eye complaints or rashes. He does report lower extremity joint pain over the last several years and this has not changed at this point.  He reports he does occasionally use ibuprofen but this is sporadic and not on a daily basis  Review of Systems As per history of present illness, otherwise negative  Current Medications, Allergies, Past Medical History, Past Surgical History, Family History  and Social History were reviewed in [employer] record. --He does admit to occasional recreational marijuana use must always at night. He reports this "settles his stomach and nerves".  No tobacco.  Some ETOH consumption, perhaps 1-2 days per week. 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  Is leaving in the next few days for a poker tournament in [place]     Objective:   Physical Exam BP 122/80  Pulse 72  Ht 5' 7.5" (1.715 m)  Wt 203 lb 3.2 oz (92.171 kg)  BMI 31.34 kg/m2 Constitutional: Well-developed and well-nourished. No distress. HEENT: Normocephalic and atraumatic. Oropharynx is clear and moist. No oropharyngeal exudate. Conjunctivae are normal.  No scleral icterus. Neck: Neck supple. Trachea midline. Cardiovascular: Normal rate, regular rhythm and intact distal pulses. No M/R/G Pulmonary/chest: Effort normal and breath sounds normal. No wheezing, rales or rhonchi. Abdominal: Soft, mild tenderness in the right and mid abdomen without rebound or guarding, nondistended. Bowel sounds active throughout. There are no masses palpable. Extremities: no clubbing, cyanosis, or edema Lymphadenopathy: No cervical adenopathy noted. Neurological: Alert and oriented to person place and time. Skin: Skin is warm and dry. No rashes noted. Psychiatric: Normal mood and affect. Behavior is normal.  CRP 4.68  CBC    Component Value Date/Time   WBC 6.8 12/03/2013 1044   RBC 5.77 12/03/2013 1044   HGB 17.2* 12/03/2013 1044   HCT 49.8 12/03/2013 1044   PLT 291.0 12/03/2013 1044   MCV 86.3 12/03/2013 1044   MCH 27.4 10/31/2012 0836   MCHC 34.5 12/03/2013 1044   RDW  13.2 12/03/2013 1044   LYMPHSABS 1.5 12/03/2013 1044   MONOABS 0.5 12/03/2013 1044   EOSABS 0.2 12/03/2013 1044   BASOSABS 0.0 12/03/2013 1044   Erythrocyte Sedimentation Rate     Component Value Date/Time   ESRSEDRATE 6 12/03/2013 1044      Assessment & Plan:  23 yo male  with PMH of perforated appendicitis status post appendectomy in December 2013 with exploration 2 days later, and a recent diagnosis of ileitis most consistent with Crohn's disease   1.  Crohn's ileitis -- new diagnosis for him. He is having a hard time coming to terms with the diagnosis, because he wants to know 100% that this is definitely what he has. He is self-admittedly a statistically/mathematically oriented and he wants to know for sure. I told him that I am 99% sure the inflammation seen at colonoscopy and by CT scan is Crohn's disease. I do not think he has had enough NSAID exposure to cause this type of inflammation, and an infectious etiology is very unlikely.  I think he would benefit from a second opinion with Dr. Jeanne Ivan at [facility]. I let him know that she could discuss his symptoms, review his imaging and procedures and give [place] her opinion.  I personally think he would feel better with more information from a different source.  He let me know that he trusts my opinion, and wants to continue his care with me, but I do think he would benefit from an additional opinion.  We did discuss the natural history of Crohn's disease as well as therapy.  I would like him to complete 8 weeks of budesonide therapy and follow a low residue diet.  I also am sending a Prometheus IBD panel today to provide hopefully supportive information.  I have asked that he call me in 5 weeks, which will be 6 weeks and to budesonide, to let me know how he is feeling. I expect that he is not exactly sure what true "normal" is given that he is likely at some level of ileitis for quite some time. Hopefully with budesonide, he can improve then reach remission.  We did discuss the chronicity of Crohn's disease and that most patients require maintenance therapy with either immunomodulators or biologics to maintain long-term remission.     2.  Dyspepsia -- will continue pantoprazole 40 mg daily for now, which has been beneficial to  this point.  Greater than 45 minutes spent in direct patient contact today

## 2013-12-11 NOTE — Patient Instructions (Signed)
We have sent the following medications to your pharmacy for you to pick up at your convenience: Enticort for 8 weeks   Your physician has requested that you go to the basement for the following lab work before leaving today: Promoetheus IBD

## 2013-12-15 ENCOUNTER — Telehealth: Payer: Self-pay | Admitting: Gastroenterology

## 2013-12-15 ENCOUNTER — Telehealth: Payer: Self-pay | Admitting: *Deleted

## 2013-12-15 LAB — PROMETHEUS-MAIL

## 2013-12-15 NOTE — Telephone Encounter (Signed)
Faxed promethius test requisition to [facility] @ [phone]

## 2013-12-15 NOTE — Telephone Encounter (Signed)
lmom for pt to call back. He has an appt with Dr Jeanne Ivan at [facility] for 02/08/14 at 1pm, fax [phone]. I will fax info on pt and [facility] will send out info.

## 2013-12-15 NOTE — Telephone Encounter (Signed)
Informed pt of appt and he states they called him yesterday. He states he was hoping for an earlier appt, but that's all they had.\ Pt states he is still having a problem with pain; tramadol is not helping. Pain is in the lower part of his stomach about at his navel. He is also having trouble sleeping because of the pain. Please advise. Thanks.

## 2013-12-16 MED ORDER — HYDROCODONE-ACETAMINOPHEN 5-325 MG PO TABS: ORAL_TABLET | ORAL | Status: DC

## 2013-12-16 NOTE — Telephone Encounter (Signed)
Explained Dr Vena Rua instructions and orders to pt. Explained that he will have to pick up the script d/t new regulations and pt states he's in [place]. He will call me back. Called [facility] in [place] and they will not accept any out of state script fax or a verbal. Informed pt and he will have his mom pick up the script. Script at the front desk.

## 2013-12-16 NOTE — Telephone Encounter (Signed)
Discontinue tramadol Okay for hydrocodone/apap 5-325 mg 1-2 tablets qhs prn pain Let him know this is a narcotic, he cannot drink etoh or use marijuana with this medication, nor drink or operate heavy machinery This will NOT be a long term med as I expect the budesonide to improve ileal crohn's and thus pain

## 2013-12-16 NOTE — Telephone Encounter (Signed)
lmom for pt to call back

## 2013-12-24 ENCOUNTER — Telehealth: Payer: Self-pay | Admitting: Gastroenterology

## 2013-12-24 NOTE — Telephone Encounter (Signed)
Spoke to pt, gave him promethius lab results. Pt states he is in [place] Would like to talk to Dr. Hilarie Fredrickson as to which medication he should take AZA or Humira. I told him I will have Dr. Hilarie Fredrickson call him back.

## 2013-12-30 ENCOUNTER — Telehealth: Payer: Self-pay | Admitting: Internal Medicine

## 2013-12-31 NOTE — Telephone Encounter (Signed)
Patient left message with answering service at 5pm that he was not feeling.

## 2013-12-31 NOTE — Telephone Encounter (Signed)
Referral has been sent to [facility] as Dr. Hilarie Fredrickson requested.  Patient is relocating to the [facility] area.  I have left a message for patient to call back

## 2013-12-31 NOTE — Telephone Encounter (Signed)
Patient will be seen today at [facility] in [place].  He called to let me know he was on the way to the office now.  I have faxed his records to [phone] attention Candace.

## 2014-01-04 ENCOUNTER — Telehealth: Payer: Self-pay | Admitting: Internal Medicine

## 2014-01-04 NOTE — Telephone Encounter (Signed)
Patient was diagnosed with c-diff and was started on Flagyl.  He is concerned and has questions for Dr. Hilarie Fredrickson that he has had c-diff all along and not crohn's.  He wants to know if Dr. Hilarie Fredrickson could please call him.  His gastro in [place] is asking for copies of pathology I have faxed this and the results of the Prometheus profile to [phone].  Dr. Hilarie Fredrickson patient would like you to call when possible.

## 2014-01-06 NOTE — Telephone Encounter (Signed)
Spoke by phone to provide reassurance He is getting IBD care at [facility] and has follow-up next Thursday He continues on budesonide and c diff treatment He will keep me in the loop and call with questions

## 2014-02-24 DIAGNOSIS — K5090 Crohn's disease, unspecified, without complications: Secondary | ICD-10-CM | POA: Insufficient documentation

## 2014-06-24 ENCOUNTER — Telehealth: Payer: Self-pay | Admitting: Internal Medicine

## 2014-06-24 NOTE — Telephone Encounter (Signed)
Can work in with advanced practitioner with the knowledge that I may clinic and can participate in the assessment and plan Records from his gastroenterologist in [place] would help significantly

## 2014-06-24 NOTE — Telephone Encounter (Signed)
Pt scheduled to see Tye Savoy NP 06/29/14@2 :30pm. Left message for pts mother to call back.

## 2014-06-24 NOTE — Telephone Encounter (Signed)
Spoke with pts mother and she is aware. States she will contact GI in [place] and have records faxed to [phone].

## 2014-06-24 NOTE — Telephone Encounter (Signed)
Pts mother states that her son is going to be in town next week and he would like to see Dr. Hilarie Fredrickson. States he has not been feeling well since moving to [place] and not pleased with his care there. She would like for him to be seen next week by Dr. Hilarie Fredrickson. Explained that Dr. Vena Rua schedule is full and he is only seeing pts in the office on 06/29/14. Mother would like him worked in, let her know he may need to be worked in with Associate Professor. Dr. Hilarie Fredrickson please advise.

## 2014-06-29 ENCOUNTER — Other Ambulatory Visit: Payer: Self-pay

## 2014-06-29 ENCOUNTER — Ambulatory Visit (INDEPENDENT_AMBULATORY_CARE_PROVIDER_SITE_OTHER): Payer: BC Managed Care – PPO | Admitting: Nurse Practitioner

## 2014-06-29 ENCOUNTER — Encounter: Payer: Self-pay | Admitting: Nurse Practitioner

## 2014-06-29 ENCOUNTER — Other Ambulatory Visit (INDEPENDENT_AMBULATORY_CARE_PROVIDER_SITE_OTHER): Payer: BC Managed Care – PPO

## 2014-06-29 VITALS — BP 114/78 | HR 80 | Ht 67.5 in | Wt 195.6 lb

## 2014-06-29 DIAGNOSIS — K5000 Crohn's disease of small intestine without complications: Secondary | ICD-10-CM

## 2014-06-29 DIAGNOSIS — K50019 Crohn's disease of small intestine with unspecified complications: Secondary | ICD-10-CM

## 2014-06-29 DIAGNOSIS — K50018 Crohn's disease of small intestine with other complication: Secondary | ICD-10-CM

## 2014-06-29 LAB — CBC WITH DIFFERENTIAL/PLATELET
BASOS ABS: 0 10*3/uL (ref 0.0–0.1)
Basophils Relative: 0.3 % (ref 0.0–3.0)
EOS ABS: 0.1 10*3/uL (ref 0.0–0.7)
Eosinophils Relative: 1.1 % (ref 0.0–5.0)
HCT: 48.5 % (ref 39.0–52.0)
Hemoglobin: 16.4 g/dL (ref 13.0–17.0)
LYMPHS PCT: 18.5 % (ref 12.0–46.0)
Lymphs Abs: 1.4 10*3/uL (ref 0.7–4.0)
MCHC: 33.8 g/dL (ref 30.0–36.0)
MCV: 92.4 fl (ref 78.0–100.0)
Monocytes Absolute: 0.5 10*3/uL (ref 0.1–1.0)
Monocytes Relative: 7.2 % (ref 3.0–12.0)
NEUTROS PCT: 72.9 % (ref 43.0–77.0)
Neutro Abs: 5.4 10*3/uL (ref 1.4–7.7)
Platelets: 286 10*3/uL (ref 150.0–400.0)
RBC: 5.25 Mil/uL (ref 4.22–5.81)
RDW: 15 % (ref 11.5–15.5)
WBC: 7.4 10*3/uL (ref 4.0–10.5)

## 2014-06-29 NOTE — Progress Notes (Signed)
     History of Present Illness:   Patient is a 23 year old male known to Dr. Hilarie Fredrickson who diagnosed him with Crohn's ileitis in January of this year. Patient was initially treated with budesonide but relocated to [place] shortly after diagnosis. I do not have records from his gastroenterologist in [place] but patient tells me he is on 160m of Imuran daily and has been on a tapering dose of Prednisone for about two months. [place] because he is not totally content [place] really felt well since.diagosis of IBD almost a year ago. He has alternating bowel habits, intermittent abdominal cramping. Stools are black sometimes.   Patient had been on a PPI but ran out of the med several days ago. After being off PPI for a few days patient had an episode of what sounds like acid reflux. He regurgitated some gastric contents which included what appeared to be blood.   Current Medications, Allergies, Past Medical History, Past Surgical History, Family History and Social History were reviewed in [facility].  Physical Exam: General: Pleasant, well developed , white male in no acute distress Head: Normocephalic and atraumatic Eyes:  sclerae anicteric, conjunctiva pink  Ears: Normal auditory acuity Lungs: Clear throughout to auscultation Heart: Regular rate and rhythm Abdomen: Soft, non distended, mild LLQ tenderness.  No masses, no hepatomegaly. Normal bowel sounds Rectal: light brown, Hemoccult-negative stool Musculoskeletal: Symmetrical with no gross deformities  Extremities: No edema  Neurological: Alert oriented x 4, grossly nonfocal Psychological:  Alert and cooperative. Normal mood and affect  Assessment and Recommendations:  [age]year old male diagnosed by [place] Crohn's ileitis in January of this year. Patient [place] Patient started 150 mg of Imuran daily approx two months ago. He is almost complete with a long prednisone taper. Overall ETheophilusdoes not feel well. He still struggles with altered bowel habits and abdominal cramping. The patient, Dr. PHilarie Fredricksonand discussed options. At this point patient will be scheduled for Ace small bowel video capsule study for further evaluation. Will obtain a hyperemic tablet level. Trial of DSL 3 antibiotics. Depending on metabolite level we do have room to increase the Imuran dose. We'll call patient with test results and further recommendations.  2. GERD, symptomatic on PPI. He will restart PPI  3. Intermittent black stool. He has light brown, Hemoccult-negative stool today. We'll obtain a CBC.

## 2014-06-29 NOTE — Patient Instructions (Addendum)
Please go to the basement level to have your labs drawn.   You are scheduled for a capsule endoscopy. Thursday 07-02-2014.  Arrive at 8:00 am .  Go to your pharmacy and ask the pharmacist about a probiotic, VSL#3.  We would like you to take it for 6 weeks. We have given you a coupon.

## 2014-07-02 ENCOUNTER — Encounter (HOSPITAL_COMMUNITY): Payer: Self-pay | Admitting: *Deleted

## 2014-07-02 ENCOUNTER — Telehealth: Payer: Self-pay

## 2014-07-02 ENCOUNTER — Encounter (HOSPITAL_COMMUNITY)
Admission: AD | Disposition: A | Discharge status: Home / Self Care | Payer: Self-pay | Source: Ambulatory Visit | Attending: Internal Medicine

## 2014-07-02 ENCOUNTER — Ambulatory Visit (HOSPITAL_COMMUNITY)
Admission: AD | Admit: 2014-07-02 | Discharge: 2014-07-02 | Disposition: A | Discharge status: Home / Self Care | Payer: BC Managed Care – PPO | Source: Ambulatory Visit | Attending: Internal Medicine | Admitting: Internal Medicine

## 2014-07-02 DIAGNOSIS — K5000 Crohn's disease of small intestine without complications: Secondary | ICD-10-CM | POA: Insufficient documentation

## 2014-07-02 HISTORY — PX: GIVENS CAPSULE STUDY: SHX5432

## 2014-07-02 SURGERY — IMAGING PROCEDURE, GI TRACT, INTRALUMINAL, VIA CAPSULE
Anesthesia: LOCAL

## 2014-07-02 SURGICAL SUPPLY — 1 items: TOWEL COTTON PACK 4EA (MISCELLANEOUS) ×4 IMPLANT

## 2014-07-02 NOTE — Telephone Encounter (Signed)
Pt was scheduled for capsule endo this morning at 8am. Pt called at 9am to state that he forgot he needed to be here at [place] to pt that we could not do his capsule here due to the late arrival. Spoke with Kathlee Nations at [facility]. [facility] and they will [facility] the capsule endo for him at the hospital. Pt aware and states he is going to [facility]. States he did the prep but did not have much result. States he has not had anything to eat or drink this morning. Dr. Raquel James notified.

## 2014-07-05 ENCOUNTER — Encounter (HOSPITAL_COMMUNITY): Payer: Self-pay | Admitting: Internal Medicine

## 2014-07-05 NOTE — OR Nursing (Signed)
Mr Pickeral just returned capsule recorder, will download in the a.m. And notify MD/PA

## 2014-07-06 ENCOUNTER — Other Ambulatory Visit: Payer: Self-pay

## 2014-07-06 DIAGNOSIS — Z79899 Other long term (current) drug therapy: Secondary | ICD-10-CM

## 2014-07-06 LAB — THIOPURINE METABOLITES
6 MMPN METABOLITE: 1875 pmol/8x 10E8
6-TGN Metabolite: 215 pmol/8x 10E8

## 2014-07-06 MED ORDER — AZATHIOPRINE 50 MG PO TABS: 200.0000 mg | ORAL_TABLET | Freq: Every day | ORAL | Status: DC

## 2014-07-13 ENCOUNTER — Encounter: Payer: Self-pay | Admitting: Physician Assistant

## 2014-07-15 ENCOUNTER — Telehealth: Payer: Self-pay

## 2014-07-15 MED ORDER — BUDESONIDE 9 MG PO TB24: 9.0000 mg | ORAL_TABLET | Freq: Every day | ORAL | Status: DC

## 2014-07-15 NOTE — Telephone Encounter (Signed)
Spoke with pt and he is aware of capsule results per Dr. Hilarie Fredrickson. Pt knows to follow-up with MD in [place] and to have his CBC and LFT's checked. Pt states he had forgotten to start VSL#3 but he will start this. Script sent to pharmacy for Budesonide.

## 2014-07-16 ENCOUNTER — Encounter: Payer: Self-pay | Admitting: Internal Medicine

## 2014-07-30 ENCOUNTER — Encounter: Payer: Self-pay | Admitting: Nurse Practitioner

## 2014-07-30 ENCOUNTER — Telehealth: Payer: Self-pay | Admitting: Internal Medicine

## 2014-07-30 MED ORDER — AZATHIOPRINE 50 MG PO TABS
200.0000 mg | ORAL_TABLET | Freq: Every day | ORAL | Status: DC
Start: 2014-07-30 — End: 2014-07-30

## 2014-07-30 NOTE — Telephone Encounter (Signed)
I SPOKE WITH DR. PYRTLE ABOUT ABOVE PATIENT. PER DR. PYRTLE PATIENT WAS NOT HAPPY WITH GI IN [place] SO DR. PYRTLE DOES NOT MIND HELPING PATIENT LOCALLY BUT PATIENT WILL NEED TO HAVE LABS DRAWN EVERY THREE MONTHS(LFTS AND CBC). DR. Hilarie Fredrickson REQUESTED I TELL THE PATIENT THAT HE WOULD ALSO LIKE PATIENT TO ESTABLISH GI CARE IN [place] IF HE IS NOT HAPPY WITH OLD GI SO HE CAN HAVE SOMEONE THERE WHEN HE NEEDS HELP OR MEDICATION REFILLS. PATIENT IS A TRAVELING POKER PLAYER.  I CALLED PATIENT BACK. I ADVISED PATIENT THAT DR. PYRTLE DOES NOT MIND HELPING HIM WHEN HE IS IN TOWN HERE OR NEEDS MEDICATION REFILLS BUT FOR MEDICATION REFILLS PATIENT WILL NEED TO GET BLOOD WORK DONE EVERY THREE MONTHS. BLOOD WORK REQUIRED BY DR. PYRTLE IS A BLOOD COUNT WHICH IS A CBC AND LIVER FUNCTION TEST. I ADVISED PATIENT IF HE GETS LABS DRAWN IN [place] HE CAN CALL OUR OFFICE AND HAVE RESULTS FAXED OVER. I ADVISED PATIENT HE DID NOT GET HIS LABS DRAWN IN AUGUST SO HE WILL NEED TO GET LABS DRAWN IN THREE MONTHS FOR FURTHER REFILLS. I ADVISED PATIENT BECAUSE OF THE IMURAN THAT HE IS TAKING IT IS VERY IMPORTANT TO HAVE BLOOD WORK DONE EVERY THREE MONTHS. PATIENT VERBALIZED UNDERSTANDING. PATIENT STATED HE WILL BE IN TOWN SOON AND HE WILL DO BLOOD WORK. PATIENT STATED IF HE DOES NOT COME INTO TOWN HE WILL HAVE LABS DRAWN IN [place] THEN WILL CALL OUR OFFICE FOR FAX NUMBER.

## 2014-07-30 NOTE — Telephone Encounter (Signed)
Script cancelled.

## 2014-09-07 NOTE — Telephone Encounter (Signed)
Error

## 2014-10-11 ENCOUNTER — Telehealth: Payer: Self-pay | Admitting: Internal Medicine

## 2014-10-11 NOTE — Telephone Encounter (Signed)
Patient did not come for labwork or have labwork sent to [place] from [place] which was asked of him when he called in 07/2014. Jacob [name] explained this information to him at that time. Unfortunately, we are unable to give medication without the proper follow up. Therefore, when patient gets labwork completed, we will discuss refilling meds.

## 2014-10-21 ENCOUNTER — Telehealth: Payer: Self-pay

## 2014-10-21 NOTE — Telephone Encounter (Signed)
Called patient to remind him to get his flu shot.  He stated that he is sick but will get shot when he is feeling better.

## 2015-10-10 ENCOUNTER — Encounter: Payer: Self-pay | Admitting: Internal Medicine

## 2015-10-11 ENCOUNTER — Encounter: Payer: Self-pay | Admitting: Physician Assistant

## 2015-10-11 ENCOUNTER — Ambulatory Visit (INDEPENDENT_AMBULATORY_CARE_PROVIDER_SITE_OTHER): Payer: BLUE CROSS/BLUE SHIELD | Admitting: Physician Assistant

## 2015-10-11 ENCOUNTER — Other Ambulatory Visit (INDEPENDENT_AMBULATORY_CARE_PROVIDER_SITE_OTHER): Payer: BLUE CROSS/BLUE SHIELD

## 2015-10-11 VITALS — BP 112/76 | HR 84 | Ht 68.0 in | Wt 185.0 lb

## 2015-10-11 DIAGNOSIS — F411 Generalized anxiety disorder: Secondary | ICD-10-CM

## 2015-10-11 DIAGNOSIS — K50019 Crohn's disease of small intestine with unspecified complications: Secondary | ICD-10-CM | POA: Diagnosis not present

## 2015-10-11 DIAGNOSIS — K219 Gastro-esophageal reflux disease without esophagitis: Secondary | ICD-10-CM | POA: Diagnosis not present

## 2015-10-11 LAB — CBC WITH DIFFERENTIAL/PLATELET
BASOS PCT: 1 % (ref 0.0–3.0)
Basophils Absolute: 0 10*3/uL (ref 0.0–0.1)
EOS ABS: 0 10*3/uL (ref 0.0–0.7)
EOS PCT: 1.1 % (ref 0.0–5.0)
HCT: 51.9 % (ref 39.0–52.0)
LYMPHS ABS: 0.8 10*3/uL (ref 0.7–4.0)
Lymphocytes Relative: 18.1 % (ref 12.0–46.0)
MCHC: 34.5 g/dL (ref 30.0–36.0)
MCV: 96.5 fl (ref 78.0–100.0)
MONO ABS: 0.3 10*3/uL (ref 0.1–1.0)
Monocytes Relative: 7.4 % (ref 3.0–12.0)
NEUTROS ABS: 3.1 10*3/uL (ref 1.4–7.7)
Neutrophils Relative %: 72.4 % (ref 43.0–77.0)
PLATELETS: 239 10*3/uL (ref 150.0–400.0)
RBC: 5.38 Mil/uL (ref 4.22–5.81)
RDW: 13.9 % (ref 11.5–15.5)
WBC: 4.3 10*3/uL (ref 4.0–10.5)

## 2015-10-11 LAB — COMPREHENSIVE METABOLIC PANEL
ALBUMIN: 4.9 g/dL (ref 3.5–5.2)
ALT: 29 U/L (ref 0–53)
AST: 22 U/L (ref 0–37)
Alkaline Phosphatase: 84 U/L (ref 39–117)
BUN: 12 mg/dL (ref 6–23)
CHLORIDE: 103 meq/L (ref 96–112)
CO2: 29 meq/L (ref 19–32)
Calcium: 9.9 mg/dL (ref 8.4–10.5)
Creatinine, Ser: 1.07 mg/dL (ref 0.40–1.50)
GFR: 89.07 mL/min (ref >60.00)
Glucose, Bld: 98 mg/dL (ref 70–99)
POTASSIUM: 4.4 meq/L (ref 3.5–5.1)
SODIUM: 139 meq/L (ref 135–145)
Total Bilirubin: 0.9 mg/dL (ref 0.2–1.2)
Total Protein: 7.5 g/dL (ref 6.0–8.3)

## 2015-10-11 LAB — HIGH SENSITIVITY CRP: CRP HIGH SENSITIVITY: 1.81 mg/L (ref 0.000–5.000)

## 2015-10-11 LAB — SEDIMENTATION RATE: SED RATE: 1 mm/h (ref 0–22)

## 2015-10-11 MED ORDER — PANTOPRAZOLE SODIUM 40 MG PO TBEC: DELAYED_RELEASE_TABLET | ORAL | Status: DC

## 2015-10-11 MED ORDER — ALPRAZOLAM 0.25 MG PO TABS: 0.2500 mg | ORAL_TABLET | Freq: Every evening | ORAL | Status: DC | PRN

## 2015-10-11 MED ORDER — BUDESONIDE 3 MG PO CPEP: ORAL_CAPSULE | ORAL | Status: DC

## 2015-10-11 MED ORDER — AZATHIOPRINE 50 MG PO TABS: ORAL_TABLET | ORAL | Status: DC

## 2015-10-11 NOTE — Patient Instructions (Addendum)
Please go to the basement level to have your labs drawn.  We sent prescriptions to [facility] have been scheduled for an endoscopy and colonoscopy. Please follow the written instructions given to you at your visit today. Please pick up your prep supplies at the pharmacy within the next 1-3 days. If you use inhalers (even only as needed), please bring them with you on the day of your procedure.

## 2015-10-11 NOTE — Progress Notes (Addendum)
Patient ID: Lucky Rathke, male   DOB: October 18, 1991, 24 y.o.   MRN: [id number]   Subjective:    Patient ID: Lucky Rathke, male    DOB: [birth year], 24 y.o.   MRN: [id number]  HPI Garik is a 24 year old white male known to Dr.Pyrtle who was diagnosed with Crohn's ileitis in 2015. He is status post appendectomy in 5284 which was complicated by a stump leakage and abscess which required drainage. He had colonoscopy done in January 2015 which showed moderate ileitis and biopsies concern confirmed active and ulcerative ileitis. Biopsies of the right colon were benign. He also had a sigmoid colon polyp removed which was hyperplastic. He had capsule endoscopy in August 2015 to assess activity of disease and was found to have active Crohn's with ulcerations in the distal small bowel. Line Patient has had care both here and in [place] and has been back-and-forth between jobs. He has not been seen in our office since August 2015 and comes in today stating that he wants to reestablish care here and will be traveling back and forth but wants to have GI care in [place]. he is not entirely satisfied with his physician in [place]. This person did suggest that he have evaluation with colonoscopy and endoscopy which the patient would like to have at this time. Fine He has not had any labs for several months. He says he has continued on azathioprine 50 mg 5 tablets per day but says he has missed doses and at one point had run out but has tried to be compliant. He has not required any steroids or any other medication since he was seen here last. He does not feel that his symptoms are well controlled at this point. He says he has some problems with anxiety has been having trouble sleeping and infrequently wakes up in the morning with nausea and lower abdominal discomfort. He has had some reflux symptoms as well no dysphagia. Recently he has been having lower abdominal discomfort and some cramping no regular diarrhea but  occasionally and see small amounts of blood intermittently. He has not had any fever or chills weight has been fairly stable though he says his appetite has been decreased and his energy level has not been great recently.   He has been treated with budesonide in the past with improvement in symptoms. He had been given a prescription for ranitidine 300 mg but has not been taking this regularly.  Review of Systems  Pertinent positive and negative review of systems were noted in the above HPI section.  All other review of systems was otherwise negative.  Outpatient Encounter Prescriptions as of 10/11/2015  Medication Sig  . azaTHIOprine (IMURAN) 50 MG tablet Take 5 tablets by mouth daily.  . Budesonide 9 MG TB24 Take 9 mg by mouth daily.  . ondansetron (ZOFRAN) 4 MG tablet   . ranitidine (ZANTAC) 300 MG tablet Take 300 mg by mouth at bedtime. 1/2 as needed  . [DISCONTINUED] azaTHIOprine (IMURAN) 50 MG tablet Take 50 mg by mouth daily. 3 tablets in the morning 2 tablets at night  . ALPRAZolam (XANAX) 0.25 MG tablet Take 1 tablet (0.25 mg total) by mouth at bedtime as needed for anxiety.  . budesonide (ENTOCORT EC) 3 MG 24 hr capsule Take 3 tablets every morning.  . pantoprazole (PROTONIX) 40 MG tablet Take 1 tab at bedtime nightly.  . [DISCONTINUED] clidinium-chlordiazePOXIDE (LIBRAX) 5-2.5 MG per capsule   . [DISCONTINUED] pantoprazole (PROTONIX) 40 MG tablet Take 1 tablet (  40 mg total) by mouth daily. (Patient not taking: Reported on 10/11/2015)  . [DISCONTINUED] predniSONE (DELTASONE) 10 MG tablet    No facility-administered encounter medications on file as of 10/11/2015.   No Known Allergies Patient Active Problem List   Diagnosis Date Noted  . Crohn's ileitis ([place]) 12/11/2013  . Dyspepsia 12/11/2013  . Anxiety state, unspecified 11/12/2013  . S/P interval appendectomy Dec 2013 10/25/2012  . Obesity (BMI 30-39.9) 10/18/2012   Social History   Social History  . Marital Status: Single     Spouse Name: N/A  . Number of Children: 0  . Years of Education: N/A   Occupational History  . self-employed    Social History Main Topics  . Smoking status: Former Smoker    Types: Cigarettes  . Smokeless tobacco: Never Used  . Alcohol Use: 3.0 oz/week    5 Cans of beer per week     Comment: occasional  . Drug Use: No  . Sexual Activity: No   Other Topics Concern  . Not on file   Social History Narrative    Mr. Kernes's family history includes Cancer in his maternal grandmother and paternal grandfather; Diabetes in his paternal grandfather. There is no history of Colon cancer.      Objective:    Filed Vitals:   10/11/15 0946  BP: 112/76  Pulse: 84    Physical Exam  well-developed young white male in no acute distress, blood pressure 112/76 pulse 84 height 5 foot 8 weight 185. HEENT; nontraumatic cephalic EOMI PERRLA sclera anicteric, Cardiovascular ;regular rate and rhythm with S1-S2 no murmur or gallop, Pulmonary; clear bilaterally, Abdomen ;soft is tender across lower abdomen and in the left lower quadrant greater than right lower quadrant no guarding or rebound no palpable mass or hepatosplenomegaly bowel sounds are present, Rectal ;exam not done, Ext;no clubbing cyanosis or edema skin warm and dry, Neuropsych; mood and affect appropriate       Assessment & Plan:   #1 24 yo WM with Crohns ileitis dx 11/2013- on Imuran 250 mg po daily- symptomatic with intermittent nausea, and frequent lower abdominal discomfort, cramping, intermittent diarrhea #2 some compliance issues #3 GERD #4 anxiety/insomnia- had bee given Xanax in the past and asks today-   Plan; Pt wishes to proceed with EGD and Colonoscopy, though wants something done in next few weeks so will start with colonoscopy with Dr Hilarie Fredrickson. Procedure discussed in detail and he is aggreable to proceed. Check CBC,CMET, ESR CRP, Quantiferon gold and Hepatitis B SAG, Hep B SAB  Continue Azathioprine at current dose  50 mg-5 tablets daily  Restart a course of Entocort 9 mg daily x 8 weeks  Short term RX for Xanax 0.25 mg po qhs - and advised him to establish PCP for any further RX Add trial of protonix 40 mg po qpm  Amy S Esterwood PA-C 10/11/2015   Cc: Leandrew Koyanagi, MD   Addendum: Reviewed and agree with management. Jerene Bears, MD

## 2015-10-12 LAB — HEPATITIS B SURFACE ANTIBODY,QUALITATIVE: HEP B S AB: NEGATIVE

## 2015-10-12 LAB — HEPATITIS B SURFACE ANTIGEN: Hepatitis B Surface Ag: NEGATIVE

## 2015-10-13 LAB — QUANTIFERON TB GOLD ASSAY (BLOOD)
INTERFERON GAMMA RELEASE ASSAY: NEGATIVE
Mitogen value: >10 IU/mL
Quantiferon Nil Value: 0.08 IU/mL
Quantiferon Tb Ag Minus Nil Value: 0.03 IU/mL
TB AG VALUE: 0.11 [IU]/mL

## 2015-10-19 ENCOUNTER — Other Ambulatory Visit: Payer: Self-pay | Admitting: *Deleted

## 2015-10-19 DIAGNOSIS — K50119 Crohn's disease of large intestine with unspecified complications: Secondary | ICD-10-CM

## 2015-10-19 MED ORDER — HEPATITIS B VAC RECOMBINANT 5 MCG/0.5ML IJ SUSP: 0.5000 mL | Freq: Once | INTRAMUSCULAR | Status: DC

## 2015-11-08 ENCOUNTER — Other Ambulatory Visit: Payer: Self-pay | Admitting: Emergency Medicine

## 2015-11-08 ENCOUNTER — Other Ambulatory Visit: Payer: Self-pay | Admitting: Internal Medicine

## 2015-11-15 ENCOUNTER — Telehealth: Payer: Self-pay | Admitting: Internal Medicine

## 2015-11-16 MED ORDER — NA SULFATE-K SULFATE-MG SULF 17.5-3.13-1.6 GM/177ML PO SOLN: ORAL | Status: DC

## 2015-11-16 NOTE — Telephone Encounter (Signed)
Patient saw Amy and was scheduled for endo/colon. Rx sent to pharmacy for [place].

## 2015-11-17 ENCOUNTER — Ambulatory Visit (AMBULATORY_SURGERY_CENTER): Payer: BLUE CROSS/BLUE SHIELD | Admitting: Internal Medicine

## 2015-11-17 ENCOUNTER — Other Ambulatory Visit: Payer: BLUE CROSS/BLUE SHIELD

## 2015-11-17 ENCOUNTER — Encounter: Payer: Self-pay | Admitting: Internal Medicine

## 2015-11-17 VITALS — BP 119/73 | HR 82 | Temp 97.7°F | Resp 18 | Ht 68.0 in | Wt 185.0 lb

## 2015-11-17 DIAGNOSIS — K50019 Crohn's disease of small intestine with unspecified complications: Secondary | ICD-10-CM

## 2015-11-17 DIAGNOSIS — K219 Gastro-esophageal reflux disease without esophagitis: Secondary | ICD-10-CM

## 2015-11-17 DIAGNOSIS — R1030 Lower abdominal pain, unspecified: Secondary | ICD-10-CM | POA: Diagnosis not present

## 2015-11-17 DIAGNOSIS — K2950 Unspecified chronic gastritis without bleeding: Secondary | ICD-10-CM | POA: Diagnosis not present

## 2015-11-17 DIAGNOSIS — B9681 Helicobacter pylori [H. pylori] as the cause of diseases classified elsewhere: Secondary | ICD-10-CM | POA: Diagnosis not present

## 2015-11-17 DIAGNOSIS — R110 Nausea: Secondary | ICD-10-CM | POA: Diagnosis not present

## 2015-11-17 MED ORDER — SODIUM CHLORIDE 0.9 % IV SOLN: 500.0000 mL | INTRAVENOUS | Status: DC

## 2015-11-17 NOTE — Progress Notes (Signed)
Stable to RR 

## 2015-11-17 NOTE — Op Note (Signed)
[place]  [address] [place], [id number]   ENDOSCOPY PROCEDURE REPORT  PATIENT: Jacob, [name]  MR#: [id number] BIRTHDATE: September 17, 1991 , 24  yrs. old GENDER: male ENDOSCOPIST: Jerene Bears, MD PROCEDURE DATE:  11/17/2015 PROCEDURE:  EGD, diagnostic and EGD w/ biopsy ASA CLASS:     Class II INDICATIONS:  Crohn's ileitis, abdominal pain, nausea and intermittent diarrhea MEDICATIONS: Monitored anesthesia care and Propofol 250 mg IV TOPICAL ANESTHETIC: none  DESCRIPTION OF PROCEDURE: After the risks benefits and alternatives of the procedure were thoroughly explained, informed consent was obtained.  The LB [id number] endoscope was introduced through the mouth and advanced to the second portion of the duodenum , Without limitations.  The instrument was slowly withdrawn as the mucosa was fully examined.   ESOPHAGUS: The mucosa of the esophagus appeared normal.   Z line regular at 40 cm.  STOMACH: The mucosa of the stomach appeared normal.  Cold forcep biopsies were taken at the gastric body, antrum and angularis to evaluate for h.  pylori.  DUODENUM: The duodenal mucosa showed no abnormalities in the bulb and 2nd part of the duodenum. Retroflexed views revealed no abnormalities.     The scope was then withdrawn from the patient and the procedure completed.  COMPLICATIONS: There were no immediate complications.  ENDOSCOPIC IMPRESSION: 1.   The mucosa of the esophagus appeared normal 2.   The mucosa of the stomach appeared normal; multiple biopsies 3.   The duodenal mucosa showed no abnormalities in the bulb and 2nd part of the duodenum  RECOMMENDATIONS: 1.  Await biopsy results 2.  Proceed with a Colonoscopy.  eSigned:  Jerene Bears, MD 11/17/2015 3:59 PM    CC: the patient, PCP

## 2015-11-17 NOTE — Patient Instructions (Signed)
YOU HAD AN ENDOSCOPIC PROCEDURE TODAY AT [facility]:   Refer to the procedure report that was given to you for any specific questions about what was found during the examination.  If the procedure report does not answer your questions, please call your gastroenterologist to clarify.  If you requested that your care partner not be given the details of your procedure findings, then the procedure report has been included in a sealed envelope for you to review at your convenience later.  YOU SHOULD EXPECT: Some feelings of bloating in the abdomen. Passage of more gas than usual.  Walking can help get rid of the air that was put into your GI tract during the procedure and reduce the bloating. If you had a lower endoscopy (such as a colonoscopy or flexible sigmoidoscopy) you may notice spotting of blood in your stool or on the toilet paper. If you underwent a bowel prep for your procedure, you may not have a normal bowel movement for a few days.  Please Note:  You might notice some irritation and congestion in your nose or some drainage.  This is from the oxygen used during your procedure.  There is no need for concern and it should clear up in a day or so.  SYMPTOMS TO REPORT IMMEDIATELY:   Following lower endoscopy (colonoscopy or flexible sigmoidoscopy):  Excessive amounts of blood in the stool  Significant tenderness or worsening of abdominal pains  Swelling of the abdomen that is new, acute  Fever of 100F or higher   Following upper endoscopy (EGD)  Vomiting of blood or coffee ground material  New chest pain or pain under the shoulder blades  Painful or persistently difficult swallowing  New shortness of breath  Fever of 100F or higher  Black, tarry-looking stools  For urgent or emergent issues, a gastroenterologist can be reached at any hour by calling [phone].   DIET: Your first meal following the procedure should be a small meal and then it is ok to progress to  your normal diet. Heavy or fried foods are harder to digest and may make you feel nauseous or bloated.  Likewise, meals heavy in dairy and vegetables can increase bloating.  Drink plenty of fluids but you should avoid alcoholic beverages for 24 hours.  ACTIVITY:  You should plan to take it easy for the rest of today and you should NOT DRIVE or use heavy machinery until tomorrow (because of the sedation medicines used during the test).    FOLLOW UP: Our staff will call the number listed on your records the next business day following your procedure to check on you and address any questions or concerns that you may have regarding the information given to you following your procedure. If we do not reach you, we will leave a message.  However, if you are feeling well and you are not experiencing any problems, there is no need to return our call.  We will assume that you have returned to your regular daily activities without incident.  If any biopsies were taken you will be contacted by phone or by letter within the next 1-3 weeks.  Please call us at [phone] if you have not heard about the biopsies in 3 weeks.    SIGNATURES/CONFIDENTIALITY: You and/or your care partner have signed paperwork which will be entered into your electronic medical record.  These signatures attest to the fact that that the information above on your After Visit Summary has been reviewed  and is understood.  Full responsibility of the confidentiality of this discharge information lies with you and/or your care-partner.   Resume medications. Lab work upon discharge.

## 2015-11-17 NOTE — Progress Notes (Signed)
Called to room to assist during endoscopic procedure.  Patient ID and intended procedure confirmed with present staff. Received instructions for my participation in the procedure from the performing physician.  

## 2015-11-17 NOTE — Op Note (Signed)
[place]  [address]. [place], [id number]   COLONOSCOPY PROCEDURE REPORT  PATIENT: [name], Jacob  MR#: [id number] BIRTHDATE: 12/13/1990 , 24  yrs. old GENDER: male ENDOSCOPIST: Jerene Bears, MD PROCEDURE DATE:  11/17/2015 PROCEDURE:   Colonoscopy, diagnostic and Colonoscopy with biopsy First Screening Colonoscopy - Avg.  risk and is 50 yrs.  old or older - No.  Prior Negative Screening - Now for repeat screening. N/A  History of Adenoma - Now for follow-up colonoscopy & has been > or = to 3 yrs.  N/A  Polyps removed today? No Recommend repeat exam, <10 yrs? No ASA CLASS:   Class II INDICATIONS:history of Crohn's ileitis on azathioprine, diagnosis January 2015, recent lower abdominal pain, intermittent diarrhea, nausea. MEDICATIONS: Monitored anesthesia care, Propofol 500 mg IV, and this was the total dose used for all procedures at this session  DESCRIPTION OF PROCEDURE:   After the risks benefits and alternatives of the procedure were thoroughly explained, informed consent was obtained.  The digital rectal exam revealed no rectal mass.   The LB [id number]  endoscope was introduced through the anus and advanced to the terminal ileum which was intubated for a short distance. No adverse events experienced.   The quality of the prep was good.  (Suprep was used)  The instrument was then slowly withdrawn as the colon was fully examined. Estimated blood loss is zero unless otherwise noted in this procedure report.      COLON FINDINGS: Scarring in the very distal terminal ileum with scattered ulceration.  Beyond the distal 5 cm of ileum the mucosa was normal in the examined segments to approximately 15-20 cm of ileum. Multiple biopsies obtained in this area to evaluate Crohn's activity.  The colonic mucosa appeared normal throughout the entire examined colon.  Retroflexed views revealed hypertrophied anal papillae. The time to cecum = 0.9 Withdrawal  time = 10.3   The scope was withdrawn and the procedure completed.  COMPLICATIONS: There were no immediate complications.   ENDOSCOPIC IMPRESSION: 1.  Mild ileitis in the very distal terminal ileum with scarring consistent with prior active Crohn's disease; multiple biopsies 2.   The colonic mucosa appeared normal throughout the entire examined colon  RECOMMENDATIONS: 1.  Await biopsy results 2.  Check thiopurine metabolite panel 3.  Office follow-up with me  eSigned:  Jerene Bears, MD 11/17/2015 4:07 PM   cc:  the patient   PATIENT NAME:  Jacob, [name] MR#: [id number]

## 2015-11-18 ENCOUNTER — Telehealth: Payer: Self-pay

## 2015-11-18 NOTE — Telephone Encounter (Signed)
Left message on answering machine. 

## 2015-11-22 LAB — THIOPURINE METABOLITES
6-MMPN Metaboilte: 6299 pmol/8x 10E8
6-TGN Metabolite: 228 pmol/8x 10E8

## 2015-11-23 ENCOUNTER — Other Ambulatory Visit: Payer: Self-pay

## 2015-11-23 DIAGNOSIS — K50919 Crohn's disease, unspecified, with unspecified complications: Secondary | ICD-10-CM

## 2015-11-24 ENCOUNTER — Telehealth: Payer: Self-pay

## 2015-11-24 ENCOUNTER — Telehealth: Payer: Self-pay | Admitting: Internal Medicine

## 2015-11-24 ENCOUNTER — Encounter: Payer: Self-pay | Admitting: Internal Medicine

## 2015-11-24 ENCOUNTER — Other Ambulatory Visit: Payer: Self-pay

## 2015-11-24 MED ORDER — OMEPRAZOLE 20 MG PO CPDR: 20.0000 mg | DELAYED_RELEASE_CAPSULE | Freq: Every day | ORAL | Status: DC

## 2015-11-24 MED ORDER — BIS SUBCIT-METRONID-TETRACYC 140-125-125 MG PO CAPS: 3.0000 | ORAL_CAPSULE | Freq: Three times a day (TID) | ORAL | Status: DC

## 2015-11-24 NOTE — Telephone Encounter (Signed)
I have spoken to patient to advise that insurance already denied suprep rx coverage. Unfortunately, he had already paid for the prep by the time insurance denied rx so I am unable to do much to remedy this. Patient also wanted clarification on H Pylori etc. I explained to him. He would like a call back tomorrow so I can give him EPIC MyChart sign up information (he is currently driving).

## 2015-11-24 NOTE — Telephone Encounter (Signed)
Pt aware of H pylori results. Script for pylera and omeprazole sent to pharmacy.

## 2015-11-25 NOTE — Telephone Encounter (Signed)
Left message advising patient that I have placed the prior authorization denial in the mail as I could not get the information to copy and past into a MyChart. I have advised that he is welcome to call me back if he has futher questions.

## 2015-11-25 NOTE — Telephone Encounter (Signed)
Insurance will not cover Pylera. I have placed a 10 day course sample at the front desk for patient to pick up. Left voicemail for patient.

## 2015-11-30 ENCOUNTER — Telehealth: Payer: Self-pay | Admitting: Internal Medicine

## 2015-11-30 NOTE — Telephone Encounter (Signed)
Patient states that he has actually found his medication. All questions answered.

## 2015-12-15 ENCOUNTER — Telehealth: Payer: Self-pay | Admitting: Internal Medicine

## 2015-12-15 NOTE — Telephone Encounter (Signed)
Pt states he has been prescribed steroids for his back and wants to know if it is ok to take. Also states he has finished his budesonide and wants to know if he is supposed to continue taking this med. Please advise.

## 2015-12-15 NOTE — Telephone Encounter (Signed)
Pt has also completed the budesonide med and wants to know if he is suppose to stop the medication or continue taking it

## 2015-12-15 NOTE — Telephone Encounter (Signed)
Pt aware.

## 2015-12-15 NOTE — Telephone Encounter (Signed)
Can remain off budesonide for now Okay for steroid taper for back pain

## 2016-01-13 ENCOUNTER — Ambulatory Visit: Payer: BLUE CROSS/BLUE SHIELD | Admitting: Internal Medicine

## 2016-01-17 ENCOUNTER — Telehealth: Payer: Self-pay

## 2016-01-17 NOTE — Telephone Encounter (Signed)
Left message for pt to call back  °

## 2016-01-17 NOTE — Telephone Encounter (Signed)
-----   Message from Algernon Huxley, RN sent at 11/24/2015  2:37 PM EST ----- Regarding: Labs Pt needs labs

## 2016-01-19 NOTE — Telephone Encounter (Signed)
Letter mailed to pt, unable to reach him by phone.

## 2016-02-04 ENCOUNTER — Other Ambulatory Visit: Payer: Self-pay | Admitting: Physician Assistant

## 2016-02-06 ENCOUNTER — Telehealth: Payer: Self-pay | Admitting: *Deleted

## 2016-02-06 NOTE — Telephone Encounter (Signed)
Received refill request for Bentyl ( Dicyclomine) 10 mg from [place], [place]. Per Nicoletta Ba PA, it is ok to refill this prescription for the patient.  He just had a procedure with Dr. Hilarie Fredrickson

## 2016-02-06 NOTE — Telephone Encounter (Signed)
Ok to refill Bentyl 10 mg, patient had colon 11/2015 with Dr. Hilarie Fredrickson. Per Amy Esterwood PA-C.

## 2016-03-05 ENCOUNTER — Emergency Department (HOSPITAL_COMMUNITY): Payer: BLUE CROSS/BLUE SHIELD

## 2016-03-05 ENCOUNTER — Encounter (HOSPITAL_COMMUNITY): Payer: Self-pay | Admitting: Emergency Medicine

## 2016-03-05 ENCOUNTER — Emergency Department (HOSPITAL_COMMUNITY)
Admission: EM | Admit: 2016-03-05 | Discharge: 2016-03-06 | Disposition: A | Discharge status: Home / Self Care | Payer: BLUE CROSS/BLUE SHIELD | Attending: Emergency Medicine | Admitting: Emergency Medicine

## 2016-03-05 DIAGNOSIS — S199XXA Unspecified injury of neck, initial encounter: Secondary | ICD-10-CM | POA: Insufficient documentation

## 2016-03-05 DIAGNOSIS — Y9289 Other specified places as the place of occurrence of the external cause: Secondary | ICD-10-CM | POA: Diagnosis not present

## 2016-03-05 DIAGNOSIS — S29001A Unspecified injury of muscle and tendon of front wall of thorax, initial encounter: Secondary | ICD-10-CM | POA: Diagnosis not present

## 2016-03-05 DIAGNOSIS — S8001XA Contusion of right knee, initial encounter: Secondary | ICD-10-CM | POA: Diagnosis not present

## 2016-03-05 DIAGNOSIS — Z87891 Personal history of nicotine dependence: Secondary | ICD-10-CM | POA: Diagnosis not present

## 2016-03-05 DIAGNOSIS — Y998 Other external cause status: Secondary | ICD-10-CM | POA: Insufficient documentation

## 2016-03-05 DIAGNOSIS — Z86010 Personal history of colonic polyps: Secondary | ICD-10-CM | POA: Insufficient documentation

## 2016-03-05 DIAGNOSIS — S0990XA Unspecified injury of head, initial encounter: Secondary | ICD-10-CM | POA: Diagnosis not present

## 2016-03-05 DIAGNOSIS — Z79899 Other long term (current) drug therapy: Secondary | ICD-10-CM | POA: Insufficient documentation

## 2016-03-05 DIAGNOSIS — S29002A Unspecified injury of muscle and tendon of back wall of thorax, initial encounter: Secondary | ICD-10-CM | POA: Diagnosis not present

## 2016-03-05 DIAGNOSIS — Z8659 Personal history of other mental and behavioral disorders: Secondary | ICD-10-CM | POA: Diagnosis not present

## 2016-03-05 DIAGNOSIS — Y9389 Activity, other specified: Secondary | ICD-10-CM | POA: Insufficient documentation

## 2016-03-05 DIAGNOSIS — K5090 Crohn's disease, unspecified, without complications: Secondary | ICD-10-CM | POA: Insufficient documentation

## 2016-03-05 DIAGNOSIS — T7411XA Adult physical abuse, confirmed, initial encounter: Secondary | ICD-10-CM | POA: Diagnosis present

## 2016-03-05 MED ORDER — ACETAMINOPHEN 500 MG PO TABS
1000.0000 mg | ORAL_TABLET | Freq: Once | ORAL | Status: AC
  Administered 2016-03-06: 1000 mg via ORAL
  Filled 2016-03-05: qty 2
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
## 2016-03-06 MED ORDER — HYDROCODONE-ACETAMINOPHEN 5-325 MG PO TABS
1.0000 | ORAL_TABLET | Freq: Once | ORAL | Status: AC
  Administered 2016-03-06: 1 via ORAL
  Filled 2016-03-06: qty 1

## 2016-03-06 MED ORDER — HYDROCODONE-ACETAMINOPHEN 5-325 MG PO TABS: 2.0000 | ORAL_TABLET | ORAL | Status: DC | PRN

## 2016-03-06 NOTE — Discharge Instructions (Signed)
Take your medications as prescribed as needed for pain. I also recommend getting take Tylenol as prescribed over-the-counter as needed. He may also apply ice to affected areas for 15-20 minutes 3-4 times daily as needed for pain relief. Please follow up with a primary care provider from the Resource Guide provided below in 1 week as needed. Please return to the Emergency Department if symptoms worsen or new onset of fever, headache, neck stiffness, visual changes, lightheadedness, dizziness, chest pain, difficulty breathing, abdominal pain, vomiting, confusion, change in behavior, memory loss, agitation.

## 2016-03-29 ENCOUNTER — Other Ambulatory Visit: Payer: Self-pay | Admitting: Internal Medicine

## 2016-04-12 ENCOUNTER — Other Ambulatory Visit: Payer: Self-pay | Admitting: Physician Assistant

## 2016-04-12 ENCOUNTER — Telehealth: Payer: Self-pay | Admitting: Internal Medicine

## 2016-04-12 ENCOUNTER — Other Ambulatory Visit (INDEPENDENT_AMBULATORY_CARE_PROVIDER_SITE_OTHER): Payer: BLUE CROSS/BLUE SHIELD

## 2016-04-12 DIAGNOSIS — K50919 Crohn's disease, unspecified, with unspecified complications: Secondary | ICD-10-CM

## 2016-04-12 LAB — HEPATIC FUNCTION PANEL
ALK PHOS: 65 U/L (ref 39–117)
ALT: 29 U/L (ref 0–53)
AST: 34 U/L (ref 0–37)
Albumin: 4.8 g/dL (ref 3.5–5.2)
BILIRUBIN DIRECT: 0.1 mg/dL (ref 0.0–0.3)
BILIRUBIN TOTAL: 0.7 mg/dL (ref 0.2–1.2)
Total Protein: 7.3 g/dL (ref 6.0–8.3)

## 2016-04-12 LAB — HIGH SENSITIVITY CRP: CRP HIGH SENSITIVITY: 1.33 mg/L (ref 0.000–5.000)

## 2016-04-12 MED ORDER — AZATHIOPRINE 50 MG PO TABS: 250.0000 mg | ORAL_TABLET | Freq: Every day | ORAL | Status: DC

## 2016-04-12 NOTE — Telephone Encounter (Signed)
Patient has come for [facility] and is scheduled for follow up on 06/13/16. I have sent 1 month refill of azathioprine to [facility] [address] and 1 month refill to [facility] [address] as per patient request. I  Advised that we will send additional refills after his office visit in August (he no showed his appt in March and has cancelled other appointments prior to that). He verbalizes understanding.

## 2016-04-12 NOTE — Telephone Encounter (Signed)
Rx refills sent for patient. See refill encounter from earlier today.

## 2016-04-20 LAB — THIOPURINE METABOLITES
6 MMPN METABOLITE: 2060 pmol/8x 10E8
6-TGN Metabolite: 290 pmol/8x 10E8

## 2016-04-23 ENCOUNTER — Other Ambulatory Visit: Payer: Self-pay

## 2016-04-23 DIAGNOSIS — K5010 Crohn's disease of large intestine without complications: Secondary | ICD-10-CM

## 2016-05-10 ENCOUNTER — Other Ambulatory Visit: Payer: Self-pay | Admitting: Physician Assistant

## 2016-05-18 ENCOUNTER — Encounter: Payer: Self-pay | Admitting: *Deleted

## 2016-06-13 ENCOUNTER — Ambulatory Visit: Payer: BLUE CROSS/BLUE SHIELD | Admitting: Internal Medicine

## 2016-06-20 ENCOUNTER — Telehealth: Payer: Self-pay | Admitting: Internal Medicine

## 2016-06-21 MED ORDER — AZATHIOPRINE 50 MG PO TABS: 250.0000 mg | ORAL_TABLET | Freq: Every day | ORAL | 1 refills | Status: DC

## 2016-06-21 NOTE — Telephone Encounter (Signed)
Patient no showed his appointment 01/13/16 and again on 06/13/16. I will send him enough until his appointment 08/22/16. However, if he cancels or no shows this appointment, we will be unable to refill his medication until he is seen in office.  I have left a voicemail advising patient of this.

## 2016-07-13 ENCOUNTER — Other Ambulatory Visit: Payer: Self-pay | Admitting: Internal Medicine

## 2016-07-13 NOTE — Telephone Encounter (Signed)
Rx sent until 08/2016 visit.

## 2016-08-09 ENCOUNTER — Other Ambulatory Visit (INDEPENDENT_AMBULATORY_CARE_PROVIDER_SITE_OTHER): Payer: BLUE CROSS/BLUE SHIELD

## 2016-08-09 ENCOUNTER — Other Ambulatory Visit: Payer: Self-pay | Admitting: Internal Medicine

## 2016-08-09 ENCOUNTER — Telehealth: Payer: Self-pay | Admitting: Internal Medicine

## 2016-08-09 DIAGNOSIS — K5090 Crohn's disease, unspecified, without complications: Secondary | ICD-10-CM

## 2016-08-09 LAB — COMPREHENSIVE METABOLIC PANEL
ALT: 58 U/L — AB (ref 0–53)
AST: 45 U/L — AB (ref 0–37)
Albumin: 4.6 g/dL (ref 3.5–5.2)
Alkaline Phosphatase: 70 U/L (ref 39–117)
BILIRUBIN TOTAL: 0.9 mg/dL (ref 0.2–1.2)
BUN: 11 mg/dL (ref 6–23)
CHLORIDE: 101 meq/L (ref 96–112)
CO2: 30 meq/L (ref 19–32)
CREATININE: 0.91 mg/dL (ref 0.40–1.50)
Calcium: 9.4 mg/dL (ref 8.4–10.5)
GFR: 106.69 mL/min (ref >60.00)
GLUCOSE: 81 mg/dL (ref 70–99)
Potassium: 4.7 mEq/L (ref 3.5–5.1)
SODIUM: 136 meq/L (ref 135–145)
Total Protein: 7.4 g/dL (ref 6.0–8.3)

## 2016-08-09 LAB — CBC WITH DIFFERENTIAL/PLATELET
BASOS ABS: 0 10*3/uL (ref 0.0–0.1)
Basophils Relative: 0.4 % (ref 0.0–3.0)
EOS ABS: 0 10*3/uL (ref 0.0–0.7)
Eosinophils Relative: 1.1 % (ref 0.0–5.0)
HCT: 47.6 % (ref 39.0–52.0)
Hemoglobin: 17 g/dL (ref 13.0–17.0)
LYMPHS PCT: 21.7 % (ref 12.0–46.0)
Lymphs Abs: 0.9 10*3/uL (ref 0.7–4.0)
MCHC: 35.8 g/dL (ref 30.0–36.0)
MCV: 96.9 fl (ref 78.0–100.0)
MONO ABS: 0.4 10*3/uL (ref 0.1–1.0)
Monocytes Relative: 10 % (ref 3.0–12.0)
NEUTROS ABS: 2.6 10*3/uL (ref 1.4–7.7)
NEUTROS PCT: 66.8 % (ref 43.0–77.0)
PLATELETS: 299 10*3/uL (ref 150.0–400.0)
RBC: 4.91 Mil/uL (ref 4.22–5.81)
RDW: 16.5 % — ABNORMAL HIGH (ref 11.5–15.5)
WBC: 3.9 10*3/uL — ABNORMAL LOW (ref 4.0–10.5)

## 2016-08-09 NOTE — Telephone Encounter (Signed)
Orders have been entered in EPIC for labwork. Patient showed up to office requesting medicine and a different appointment before December. I have advised that including the 08/2016 appointment, this will be the 3rd time he has no showed or cancelled and has already been asked to keep his appointments. I reiterated how important it is for him to keep his appointments and have routine labwork as recommended as the medication he is on can effect his liver without proper follow up. I have also reiterated that we will not refill medication again if he does not keep upcoming rescheduled appointment. We will send refill of medicine after his labwork returns.

## 2016-08-09 NOTE — Telephone Encounter (Signed)
Dr Hilarie Fredrickson, see 06/20/16 telephone note in addition to this phone call and advise. May I tell him we cannot refill until we actually see him in office?

## 2016-08-09 NOTE — Telephone Encounter (Signed)
CBC and CMP are needed now -- he can come by at his convenience as soon as possible to have these drawn He does need office visit but I would rather not stop azathioprine as he is at risk for flare of his known Crohn's disease November appointment is acceptable and if not available to me with schedule him to see Nicoletta Ba, PA-C whom he has seen in the past Ok to refill AZA with Nov appt

## 2016-08-10 ENCOUNTER — Encounter: Payer: Self-pay | Admitting: *Deleted

## 2016-08-10 ENCOUNTER — Other Ambulatory Visit: Payer: Self-pay

## 2016-08-10 DIAGNOSIS — K5090 Crohn's disease, unspecified, without complications: Secondary | ICD-10-CM

## 2016-08-22 ENCOUNTER — Ambulatory Visit: Payer: BLUE CROSS/BLUE SHIELD | Admitting: Internal Medicine

## 2016-08-23 ENCOUNTER — Telehealth: Payer: Self-pay | Admitting: Internal Medicine

## 2016-08-24 NOTE — Telephone Encounter (Signed)
Patient advised that he may get labs completed elsewhere as long as they can send [place] the results. He is to contact us with a fax number to send orders to and we will be glad to send them. He is also advised that he has been scheduled for an office visit 10/01/16 @ 4 with Dr Hilarie Fredrickson to which he verbalizes understanding. He states that he will call us back with information.

## 2016-08-24 NOTE — Telephone Encounter (Signed)
Left voicemail for patient to call back. 

## 2016-08-31 NOTE — Telephone Encounter (Signed)
Patient is at [employer] number [phone].

## 2016-08-31 NOTE — Telephone Encounter (Signed)
Orders faxed to [phone].

## 2016-09-11 ENCOUNTER — Telehealth: Payer: Self-pay | Admitting: Internal Medicine

## 2016-09-11 ENCOUNTER — Other Ambulatory Visit: Payer: Self-pay

## 2016-09-11 DIAGNOSIS — K50919 Crohn's disease, unspecified, with unspecified complications: Secondary | ICD-10-CM

## 2016-09-11 NOTE — Telephone Encounter (Signed)
Pt calling for lab results, please advise. 

## 2016-09-11 NOTE — Telephone Encounter (Signed)
See result note.  

## 2016-09-11 NOTE — Telephone Encounter (Signed)
Per Dr. Hilarie Fredrickson his LFT's are normal, WBC now low normal (better). Hgb and plts are normal. Pt should continue AZA at 274m/day. Repeat CBC, CMP, and thiopurine in 3 mths.  Spoke with pt and he is aware, orders and reminder in epic.
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
## 2016-09-22 ENCOUNTER — Other Ambulatory Visit: Payer: Self-pay | Admitting: Internal Medicine

## 2016-09-23 ENCOUNTER — Other Ambulatory Visit: Payer: Self-pay | Admitting: Internal Medicine

## 2016-10-01 ENCOUNTER — Ambulatory Visit: Payer: BLUE CROSS/BLUE SHIELD | Admitting: Internal Medicine

## 2016-10-01 ENCOUNTER — Telehealth: Payer: Self-pay | Admitting: Internal Medicine

## 2016-10-09 ENCOUNTER — Ambulatory Visit (INDEPENDENT_AMBULATORY_CARE_PROVIDER_SITE_OTHER): Payer: BLUE CROSS/BLUE SHIELD | Admitting: Physician Assistant

## 2016-10-09 ENCOUNTER — Other Ambulatory Visit (INDEPENDENT_AMBULATORY_CARE_PROVIDER_SITE_OTHER): Payer: BLUE CROSS/BLUE SHIELD

## 2016-10-09 ENCOUNTER — Encounter: Payer: Self-pay | Admitting: Physician Assistant

## 2016-10-09 VITALS — BP 110/66 | HR 80 | Ht 68.0 in | Wt 196.0 lb

## 2016-10-09 DIAGNOSIS — K50919 Crohn's disease, unspecified, with unspecified complications: Secondary | ICD-10-CM | POA: Diagnosis not present

## 2016-10-09 DIAGNOSIS — K219 Gastro-esophageal reflux disease without esophagitis: Secondary | ICD-10-CM

## 2016-10-09 DIAGNOSIS — K50019 Crohn's disease of small intestine with unspecified complications: Secondary | ICD-10-CM

## 2016-10-09 DIAGNOSIS — R1084 Generalized abdominal pain: Secondary | ICD-10-CM

## 2016-10-09 LAB — C-REACTIVE PROTEIN: CRP: 0.1 mg/dL — AB (ref 0.5–20.0)

## 2016-10-09 MED ORDER — PANTOPRAZOLE SODIUM 40 MG PO TBEC: 40.0000 mg | DELAYED_RELEASE_TABLET | Freq: Two times a day (BID) | ORAL | 2 refills | Status: DC

## 2016-10-09 MED ORDER — PROMETHAZINE HCL 12.5 MG PO TABS: 12.5000 mg | ORAL_TABLET | Freq: Four times a day (QID) | ORAL | 1 refills | Status: DC | PRN

## 2016-10-09 MED ORDER — DICYCLOMINE HCL 10 MG PO CAPS: 20.0000 mg | ORAL_CAPSULE | Freq: Three times a day (TID) | ORAL | 5 refills | Status: DC

## 2016-10-09 NOTE — Progress Notes (Addendum)
Chief Complaint: Crohn's disease, abdominal pain, nausea  HPI:  Jacob [name]  is a 25 year old Caucasian male known to Dr. Hilarie Fredrickson who was diagnosed with Crohn's ileitis in 2015, who returns for follow-up of his Crohn's and a complaint of abdominal pain and nausea today.   Per chart review patient was last seen in clinic on 10/11/2015 by Nicoletta Ba, PA-C and at that time was continued on Azathioprine 50 mg 5 tablets daily, he was restarted on a course of Entocort 9 mg daily 8 weeks and Protonix 40 mg was added for complaint of reflux. Patient was also scheduled for an EGD and colonoscopy.   Review of colonoscopy performed 11/17/2015 shows mild ileitis in the very distal terminal ileum with scarring consistent with prior active Crohn's disease and otherwise normal-appearing colonic mucosa throughout the entire examined colon. EGD on that day showed normal-appearing esophagus and stomach as well as duodenum.   Patient had a recheck of CBC and CMP in September and his LFTs were slightly elevated. He was told to decrease his Azathioprine to 400 mg daily. He had repeat LFTs and these had improved in late October. He was told to continue Azathioprine at 200 mg per day. Repeat CBC, CMP and thiopurine were recommended in 3 months.   Today, the patient tells me that he has had no immediate change in his symptoms. He tells me that ever since his diagnosis of Crohn's he has felt fatigued on a daily basis, sometimes, "usually a couple days out of the week", where he just "lays around". Patient also describes at least 2 days out of the week where he feels a generalized abdominal cramping pain. This is somewhat relieved with dicyclomine which he takes 20 mg twice a day on those days. Patient also describes that his stools are not watery but are not formed, they are "somewhere in between", this is no immediate change for him. Patient also tells me that he has constant nausea which is helped with Zofran 4 mg which he  takes at least every day or every other day, sometimes twice a day. He is unaware if this makes him "extra tired". Associated symptoms include bloating. The patient is unable to clearly tell me how he feels because he "has felt this way forever and he is not sure what is normal".  Patient also describes reflux symptoms for which he was placed on Pantoprazole 40 mg. He takes this at night. He tells me that he wakes up a couple times a week with regurgitation of a yellow bile acidic tasting material. This is irregardless of his Pantoprazole.    Patient continues his Azathioprine 50 mg 4 tabs daily.   Patient denies fever, chills, blood in stool, melena, weight loss, anorexia, vomiting, dysphagia or symptoms that awaken him at night.     Past Medical History:  Diagnosis Date  . Acute appendicitis with rupture   . Anxiety   . Crohn disease ([place])   . 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 GERD (gastroesophageal reflux disease)   . H. pylori infection   . Hyperplastic colon polyp   . Ileitis     Past Surgical History:  Procedure Laterality Date  . APPENDECTOMY    . COLONOSCOPY  12/04/2013   Dr. Hilarie Fredrickson  . cyst removed  15 years ago   Right chest  . GIVENS CAPSULE STUDY N/A 07/02/2014   Procedure: GIVENS CAPSULE STUDY;  Surgeon: Jerene Bears, MD;  Location: [facility];  Service: Gastroenterology;  Laterality: N/A;  .  LAPAROSCOPIC APPENDECTOMY  10/14/2012   Procedure: APPENDECTOMY LAPAROSCOPIC;  Surgeon: Pedro Earls, MD;  Location: [facility];  Service: General;  Laterality: N/A;  . LAPAROSCOPY  10/16/2012   Procedure: LAPAROSCOPY DIAGNOSTIC;  Surgeon: Pedro Earls, MD;  Location: [facility];  Service: General;  Laterality: N/A;  Placement of drain  . splinter removal  15  years ago   splinter removed, bad infection, surgery to remove fragments    Current Outpatient Prescriptions  Medication Sig Dispense Refill  . ALPRAZolam (XANAX) 0.25 MG tablet Take 1 tablet (0.25 mg total) by mouth at bedtime  as needed for anxiety. 30 tablet 0  . azaTHIOprine (IMURAN) 50 MG tablet Take 4 tablets (200 mg total) by mouth daily. 120 tablet 0  . dicyclomine (BENTYL) 10 MG capsule TAKE 1 CAPSULE BY MOUTH EVERY 6 HOURS AS NEEDED FOR CRAMPING 60 capsule 0  . ondansetron (ZOFRAN) 4 MG tablet Take 4 mg by mouth every 6 (six) hours as needed for nausea or vomiting.     . pantoprazole (PROTONIX) 40 MG tablet Take 1 tablet (40 mg total) by mouth at bedtime. 30 tablet 3   No current facility-administered medications for this visit.     Allergies as of 10/09/2016  . (No Known Allergies)    Family History  Problem Relation Age of Onset  . Cancer Paternal Grandfather     ? type  . Diabetes Paternal Grandfather   . Cancer Maternal Grandmother     ? type  . Colon cancer Neg Hx     Social History   Social History  . Marital status: Single    Spouse name: N/A  . Number of children: 0  . Years of education: N/A   Occupational History  . self-employed Self Employed    Social History Main Topics  . Smoking status: Former Smoker    Types: Cigarettes  . Smokeless tobacco: Never Used  . Alcohol use 3.0 oz/week    5 Cans of beer per week     Comment: occasional  . Drug use: No  . Sexual activity: No   Other Topics Concern  . Not on file   Social History Narrative  . No narrative on file    Review of Systems:     Constitutional: No weight loss, fever or chills Gastrointestinal: See HPI and otherwise negative Psychiatric: Positive for history of anxiety    Physical Exam:  Vital signs: BP 110/66   Pulse 80   Ht 5' 8"  (1.727 m)   Wt 196 lb (88.9 kg)   BMI 29.80 kg/m   Constitutional:  Caucasian male appears to be in NAD, Well developed, Well nourished, alert and cooperative Respiratory: Respirations even and unlabored. Lungs clear to auscultation bilaterally.   No wheezes, crackles, or rhonchi.  Cardiovascular: Normal S1, S2. No MRG. Regular rate and rhythm. No peripheral edema,  cyanosis or pallor.  Gastrointestinal:  Soft, nondistended,moderately tender in the right lower quadrant and left lower quadrant as well as epigastrium with some involuntary guarding Normal bowel sounds. No appreciable masses or hepatomegaly. Psychiatric: Poor historian  Demonstrates good judgement and reason without abnormal affect or behaviors.  RELEVANT LABS AND IMAGING: CBC    Component Value Date/Time   WBC 3.9 (L) 08/09/2016 1648   RBC 4.91 08/09/2016 1648   HGB 17.0 08/09/2016 1648   HCT 47.6 08/09/2016 1648   PLT 299.0 08/09/2016 1648   MCV 96.9 08/09/2016 1648   MCH 27.4 10/31/2012 0836   MCHC 35.8  08/09/2016 1648   RDW 16.5 (H) 08/09/2016 1648   LYMPHSABS 0.9 08/09/2016 1648   MONOABS 0.4 08/09/2016 1648   EOSABS 0.0 08/09/2016 1648   BASOSABS 0.0 08/09/2016 1648    CMP     Component Value Date/Time   NA 136 08/09/2016 1648   K 4.7 08/09/2016 1648   CL 101 08/09/2016 1648   CO2 30 08/09/2016 1648   GLUCOSE 81 08/09/2016 1648   BUN 11 08/09/2016 1648   CREATININE 0.91 08/09/2016 1648   CALCIUM 9.4 08/09/2016 1648   PROT 7.4 08/09/2016 1648   ALBUMIN 4.6 08/09/2016 1648   AST 45 (H) 08/09/2016 1648   ALT 58 (H) 08/09/2016 1648   ALKPHOS 70 08/09/2016 1648   BILITOT 0.9 08/09/2016 1648   GFRNONAA >90 10/23/2012 0422   GFRAA >90 10/23/2012 0422   Repeat labs 08/31/16-CMP wnl  Assessment: 1. Crohn's ileitis: Currently maintained on Azathioprine 200 mg daily, this is decreased in October due to an elevation in liver enzymes found on lab work in late September, patient has had no immediate change in his symptoms but does continue with intermittent nausea, abdominal pain and cramping and variance in bowel habits 2. Abdominal pain: Consider relation to above +/-element of irritable bowel syndrome as patient's cramping discomfort is helped with dicyclomine and he does have a history of anxiety, also Crohn's disease was well controlled other than ileitis seen at time of  colonoscopy in January 3. GERD: Patient continues with reflux regardless of pantoprazole 40 mg at night   Plan: 1. Refilled patient's dicyclomine at an increased dose of 20 mg to be used 3 times daily as needed for abdominal cramping, patient has been doing this on a daily basis 2. Prescribed Promethazine 12.5 mg q6h as needed for nausea as patient would like to try something else. Discussed that I have had some patients who are fatigued on Zofran (although this is not a typical side effect) and are better on this medicine, he would like to try it 3. Continue to recommend avoidance of NSAIDs 4. Increased patient's Pantoprazole to 40 mg twice a day with 2 months of refills 5. Ordered CRP 6. Continue Azathioprine 200 mg daily 7. Patient aware that he'll need repeat CBC and CMP as well as Thiopurine levels in January per Dr. Hilarie Fredrickson 8. Scheduled follow-up appointment with Dr. Hilarie Fredrickson in February. Encouraged the patient that he should follow with him,  he can discuss Biologics at that time 9. Discussed with patient that some of his symptoms may be irritable bowel syndrome related 10. Patient to follow in clinic as above with Dr. Hilarie Fredrickson or sooner if necessary  Ellouise Newer, PA-C [facility]  Addendum: Reviewed and agree with management. Jerene Bears, MD   10/09/2016, 2:43 PM  Cc: No ref. provider found

## 2016-10-09 NOTE — Patient Instructions (Signed)
Your physician has requested that you go to the basement for the following lab work before leaving today: CRP  We have sent the following medications to your pharmacy for you to pick up at your convenience:  Increase Pantoprazole 40 mg twice a day 30-60 mins before eating.  Dicyclomine 20 mg every 8 hrs as needed  Promethazine 12.5 mg every 6 hrs as needed  Have repeat labs in January before your appointment. We will call you and remind you.

## 2016-10-11 ENCOUNTER — Telehealth: Payer: Self-pay | Admitting: Physician Assistant

## 2016-10-11 NOTE — Telephone Encounter (Signed)
Pt has been given detailed results over the phone, I have been unable to reach him by phone.  I have left several messages for him to return call and we keep playing phone tag.

## 2016-10-18 ENCOUNTER — Other Ambulatory Visit: Payer: Self-pay | Admitting: Internal Medicine

## 2016-11-16 ENCOUNTER — Other Ambulatory Visit: Payer: Self-pay | Admitting: Emergency Medicine

## 2016-11-16 DIAGNOSIS — K50018 Crohn's disease of small intestine with other complication: Secondary | ICD-10-CM

## 2016-12-13 ENCOUNTER — Encounter: Payer: Self-pay | Admitting: Internal Medicine

## 2016-12-13 ENCOUNTER — Other Ambulatory Visit (INDEPENDENT_AMBULATORY_CARE_PROVIDER_SITE_OTHER): Payer: BLUE CROSS/BLUE SHIELD

## 2016-12-13 ENCOUNTER — Ambulatory Visit (INDEPENDENT_AMBULATORY_CARE_PROVIDER_SITE_OTHER): Payer: BLUE CROSS/BLUE SHIELD | Admitting: Internal Medicine

## 2016-12-13 VITALS — BP 110/76 | HR 83 | Ht 68.0 in | Wt 204.8 lb

## 2016-12-13 DIAGNOSIS — K50018 Crohn's disease of small intestine with other complication: Secondary | ICD-10-CM

## 2016-12-13 DIAGNOSIS — Z8619 Personal history of other infectious and parasitic diseases: Secondary | ICD-10-CM | POA: Diagnosis not present

## 2016-12-13 DIAGNOSIS — K5000 Crohn's disease of small intestine without complications: Secondary | ICD-10-CM

## 2016-12-13 DIAGNOSIS — K589 Irritable bowel syndrome without diarrhea: Secondary | ICD-10-CM | POA: Diagnosis not present

## 2016-12-13 DIAGNOSIS — K219 Gastro-esophageal reflux disease without esophagitis: Secondary | ICD-10-CM | POA: Diagnosis not present

## 2016-12-13 DIAGNOSIS — R110 Nausea: Secondary | ICD-10-CM

## 2016-12-13 LAB — CBC WITH DIFFERENTIAL/PLATELET
Basophils Absolute: 0 10*3/uL (ref 0.0–0.1)
Basophils Relative: 0.4 % (ref 0.0–3.0)
EOS PCT: 0.9 % (ref 0.0–5.0)
Eosinophils Absolute: 0 10*3/uL (ref 0.0–0.7)
HCT: 47.5 % (ref 39.0–52.0)
Hemoglobin: 16.5 g/dL (ref 13.0–17.0)
LYMPHS ABS: 0.8 10*3/uL (ref 0.7–4.0)
Lymphocytes Relative: 23.7 % (ref 12.0–46.0)
MCHC: 34.7 g/dL (ref 30.0–36.0)
MCV: 98.4 fl (ref 78.0–100.0)
MONOS PCT: 9.3 % (ref 3.0–12.0)
Monocytes Absolute: 0.3 10*3/uL (ref 0.1–1.0)
NEUTROS ABS: 2.2 10*3/uL (ref 1.4–7.7)
Neutrophils Relative %: 65.7 % (ref 43.0–77.0)
PLATELETS: 248 10*3/uL (ref 150.0–400.0)
RBC: 4.82 Mil/uL (ref 4.22–5.81)
RDW: 13.6 % (ref 11.5–15.5)
WBC: 3.4 10*3/uL — ABNORMAL LOW (ref 4.0–10.5)

## 2016-12-13 LAB — COMPREHENSIVE METABOLIC PANEL
ALT: 47 U/L (ref 0–53)
AST: 35 U/L (ref 0–37)
Albumin: 4.7 g/dL (ref 3.5–5.2)
Alkaline Phosphatase: 70 U/L (ref 39–117)
BILIRUBIN TOTAL: 0.6 mg/dL (ref 0.2–1.2)
BUN: 10 mg/dL (ref 6–23)
CO2: 31 meq/L (ref 19–32)
Calcium: 9.5 mg/dL (ref 8.4–10.5)
Chloride: 102 mEq/L (ref 96–112)
Creatinine, Ser: 0.87 mg/dL (ref 0.40–1.50)
GFR: 112.07 mL/min (ref >60.00)
Glucose, Bld: 91 mg/dL (ref 70–99)
POTASSIUM: 4.4 meq/L (ref 3.5–5.1)
Sodium: 137 mEq/L (ref 135–145)
Total Protein: 6.9 g/dL (ref 6.0–8.3)

## 2016-12-13 MED ORDER — AZATHIOPRINE 50 MG PO TABS: 200.0000 mg | ORAL_TABLET | Freq: Every day | ORAL | 3 refills | Status: DC

## 2016-12-13 MED ORDER — PANTOPRAZOLE SODIUM 40 MG PO TBEC: 40.0000 mg | DELAYED_RELEASE_TABLET | Freq: Two times a day (BID) | ORAL | 3 refills | Status: DC

## 2016-12-13 MED ORDER — DICYCLOMINE HCL 10 MG PO CAPS: 20.0000 mg | ORAL_CAPSULE | Freq: Three times a day (TID) | ORAL | 2 refills | Status: DC | PRN

## 2016-12-13 MED ORDER — PROMETHAZINE HCL 12.5 MG PO TABS: 12.5000 mg | ORAL_TABLET | Freq: Every evening | ORAL | 3 refills | Status: DC | PRN

## 2016-12-13 MED ORDER — ONDANSETRON 4 MG PO TBDP: 4.0000 mg | ORAL_TABLET | Freq: Four times a day (QID) | ORAL | 3 refills | Status: DC | PRN

## 2016-12-13 NOTE — Progress Notes (Signed)
Subjective:    Patient ID: Jacob [name], male    DOB: Jan 22, 1991, 26 y.o.   MRN: [id number]  HPI Jacob [name] is a 26 year old male with Crohn's ileitis diagnosed 2015, GERD, anxiety and likely IBS who is here for follow-up. He was last seen in November 2017 by Ellouise Newer, PA-C. He presents alone today. He is maintained on azathioprine 200 mg daily. He uses Protonix 40 mg twice daily as it was increased to twice daily after his last office visit. He's used promethazine intermittently for nausea. He also uses Bentyl.  He reports today that he is "about as good as I have been in years". He reports that he is feeling well. He occasionally has crampy abdominal pain which seems to be worse if he is tired or when he is traveling. He uses Bentyl in the morning which seems to help settle his stomach. Bowel movements are mostly formed occurring once per day without blood or melena. Does have nausea which he considers mild without vomiting. He will use Phenergan at this is made him sleepy. He requests to go back to Zofran to avoid the sleepy aspect. He uses Xanax on occasion for anxiety. When he is anxious he also feels more crampy abdominal discomfort. He reports his eyes burn from time to time but no red eye or change in vision. No new joint pains. No rashes.  Colonoscopy was performed 1 year ago in January 2017. This showed scarring in the very distal terminal ileum with scattered ulceration. The colonic mucosa was normal throughout. Upper endoscopy performed on the same day was normal. However biopsies showed chronic active gastritis with H. pylori. This was treated. Biopsies from the ileum showed mildly active chronic ileitis.   Review of Systems As per history of present illness, otherwise negative  Current Medications, Allergies, Past Medical History, Past Surgical History, Family History and Social History were reviewed in [employer] record.     Objective:   Physical  Exam BP 110/76   Pulse 83   Ht 5' 8"  (1.727 m)   Wt 204 lb 12.8 oz (92.9 kg)   BMI 31.14 kg/m  Constitutional: Well-developed and well-nourished. No distress. HEENT: Normocephalic and atraumatic.  Conjunctivae are normal.  No scleral icterus. Neck: Neck supple. Trachea midline. Cardiovascular: Normal rate, regular rhythm and intact distal pulses. No M/R/G Pulmonary/chest: Effort normal and breath sounds normal. No wheezing, rales or rhonchi. Abdominal: Soft, nontender, nondistended. Bowel sounds active throughout. There are no masses palpable. No hepatosplenomegaly. Extremities: no clubbing, cyanosis, or edema Neurological: Alert and oriented to person place and time. Skin: Skin is warm and dry. No rashes noted. Psychiatric: Normal mood and affect. Behavior is normal.  CBC    Component Value Date/Time   WBC 3.4 (L) 12/13/2016 1407   RBC 4.82 12/13/2016 1407   HGB 16.5 12/13/2016 1407   HCT 47.5 12/13/2016 1407   PLT 248.0 12/13/2016 1407   MCV 98.4 12/13/2016 1407   MCH 27.4 10/31/2012 0836   MCHC 34.7 12/13/2016 1407   RDW 13.6 12/13/2016 1407   LYMPHSABS 0.8 12/13/2016 1407   MONOABS 0.3 12/13/2016 1407   EOSABS 0.0 12/13/2016 1407   BASOSABS 0.0 12/13/2016 1407   CMP     Component Value Date/Time   NA 137 12/13/2016 1407   K 4.4 12/13/2016 1407   CL 102 12/13/2016 1407   CO2 31 12/13/2016 1407   GLUCOSE 91 12/13/2016 1407   BUN 10 12/13/2016 1407   CREATININE 0.87  12/13/2016 1407   CALCIUM 9.5 12/13/2016 1407   PROT 6.9 12/13/2016 1407   ALBUMIN 4.7 12/13/2016 1407   AST 35 12/13/2016 1407   ALT 47 12/13/2016 1407   ALKPHOS 70 12/13/2016 1407   BILITOT 0.6 12/13/2016 1407   GFRNONAA >90 10/23/2012 0422   GFRAA >90 10/23/2012 0422       Assessment & Plan:  26 year old male with Crohn's ileitis diagnosed 2015, GERD, anxiety and likely IBS who is here for follow-up  1. Ileal Crohn's disease -- maintained on azathioprine. Very minimal ileal activity at  colonoscopy 1 year ago. No evidence clinically for flare at this time. I recommended that we continue azathioprine 200 mg daily. He had mild elevation in liver enzymes last year and we decreased the dose of azathioprine. We have checked a thiopurine metabolite panel today. Repeat liver enzymes are now normal. He has a very mild decrease in white count which we will follow. No obstructive symptoms. No evidence of anemia.  2. GERD -- overall improvement in dyspeptic symptoms and reflux symptoms with twice daily PPI. For now we'll continue Protonix 40 mg twice a day before meals  3. IBS -- he has a component of irritable bowel which overlaps with his Crohn's disease. We discussed that this is the pain can bother him related to his anxiety, travel, etc. Bentyl Kim B use 20 mg 2-3 times daily as needed.  4. Nausea -- we'll have him use Zofran 4 mg every 8 hours as needed for nausea. Can still use promethazine 12.5 mg to 25 mg daily at bedtime only as needed for nausea  5. History of H. Pylori -- diagnosed at EGD 1 year ago. Successfully eradicated.  Annual follow-up, sooner if necessary 25 minutes spent with the patient today. Greater than 50% was spent in counseling and coordination of care with the patient

## 2016-12-13 NOTE — Patient Instructions (Signed)
We have sent the following medications to your pharmacy for you to pick up at your convenience: Azathioprine 200 mg daily Pantoprazole 40 mg twice daily before meals. Bentyl as needed. Zofran OD every 6 hours as needed for nausea (#30 per month only) Promethazine at night as needed for nausea (#30 per month only)  Follow up with Dr Hilarie Fredrickson in 1 year.  If you are age 27 or older, your body mass index should be between 23-30. Your Body mass index is 31.14 kg/m. If this is out of the aforementioned range listed, please consider follow up with your Primary Care Provider.  If you are age 43 or younger, your body mass index should be between 19-25. Your Body mass index is 31.14 kg/m. If this is out of the aformentioned range listed, please consider follow up with your Primary Care Provider.

## 2016-12-21 ENCOUNTER — Other Ambulatory Visit: Payer: Self-pay

## 2016-12-21 DIAGNOSIS — K50919 Crohn's disease, unspecified, with unspecified complications: Secondary | ICD-10-CM

## 2016-12-21 LAB — SERIAL MONITORING

## 2016-12-21 LAB — THIOPURINE METABOLITES
6 MMPN METABOLITE: 3119 pmol/8x 10E8
6-TGN Metabolite: 210 pmol/8x 10E8

## 2017-04-02 ENCOUNTER — Other Ambulatory Visit (INDEPENDENT_AMBULATORY_CARE_PROVIDER_SITE_OTHER): Payer: BLUE CROSS/BLUE SHIELD

## 2017-04-02 DIAGNOSIS — K50919 Crohn's disease, unspecified, with unspecified complications: Secondary | ICD-10-CM

## 2017-04-02 LAB — CBC WITH DIFFERENTIAL/PLATELET
BASOS PCT: 0.7 % (ref 0.0–3.0)
Basophils Absolute: 0 10*3/uL (ref 0.0–0.1)
EOS ABS: 0.1 10*3/uL (ref 0.0–0.7)
EOS PCT: 1.7 % (ref 0.0–5.0)
HEMATOCRIT: 45.2 % (ref 39.0–52.0)
Hemoglobin: 15.7 g/dL (ref 13.0–17.0)
LYMPHS PCT: 17.6 % (ref 12.0–46.0)
Lymphs Abs: 0.6 10*3/uL — ABNORMAL LOW (ref 0.7–4.0)
MCHC: 34.8 g/dL (ref 30.0–36.0)
MCV: 99.3 fl (ref 78.0–100.0)
MONOS PCT: 6.3 % (ref 3.0–12.0)
Monocytes Absolute: 0.2 10*3/uL (ref 0.1–1.0)
NEUTROS ABS: 2.4 10*3/uL (ref 1.4–7.7)
Neutrophils Relative %: 73.7 % (ref 43.0–77.0)
PLATELETS: 261 10*3/uL (ref 150.0–400.0)
RBC: 4.55 Mil/uL (ref 4.22–5.81)
RDW: 14.9 % (ref 11.5–15.5)
WBC: 3.3 10*3/uL — ABNORMAL LOW (ref 4.0–10.5)

## 2017-04-02 LAB — COMPREHENSIVE METABOLIC PANEL
ALBUMIN: 4.5 g/dL (ref 3.5–5.2)
ALT: 47 U/L (ref 0–53)
AST: 29 U/L (ref 0–37)
Alkaline Phosphatase: 69 U/L (ref 39–117)
BUN: 10 mg/dL (ref 6–23)
CALCIUM: 9.2 mg/dL (ref 8.4–10.5)
CHLORIDE: 101 meq/L (ref 96–112)
CO2: 30 meq/L (ref 19–32)
Creatinine, Ser: 0.91 mg/dL (ref 0.40–1.50)
GFR: 106.16 mL/min (ref >60.00)
Glucose, Bld: 99 mg/dL (ref 70–99)
POTASSIUM: 4.2 meq/L (ref 3.5–5.1)
Sodium: 137 mEq/L (ref 135–145)
Total Bilirubin: 0.7 mg/dL (ref 0.2–1.2)
Total Protein: 6.6 g/dL (ref 6.0–8.3)

## 2017-04-03 ENCOUNTER — Other Ambulatory Visit: Payer: Self-pay

## 2017-04-03 DIAGNOSIS — K5090 Crohn's disease, unspecified, without complications: Secondary | ICD-10-CM

## 2017-04-10 ENCOUNTER — Telehealth: Payer: Self-pay | Admitting: Internal Medicine

## 2017-04-10 NOTE — Telephone Encounter (Signed)
Letter drafted and faxed per request.

## 2017-04-10 NOTE — Telephone Encounter (Signed)
patient mom calling in stating that pt was supposed to fly tonight but he is having a flare up and will need to reschedule. The airline is telling him that he will need a note from our office. He is changing flight from 9:30pm tonight to 9:30pm tomorrow night.   [employer]  F: [phone]   Best # to call patient at is [phone]

## 2017-11-06 ENCOUNTER — Other Ambulatory Visit (INDEPENDENT_AMBULATORY_CARE_PROVIDER_SITE_OTHER): Payer: BLUE CROSS/BLUE SHIELD

## 2017-11-06 DIAGNOSIS — K5090 Crohn's disease, unspecified, without complications: Secondary | ICD-10-CM | POA: Diagnosis not present

## 2017-11-06 LAB — CBC WITH DIFFERENTIAL/PLATELET
BASOS ABS: 0 10*3/uL (ref 0.0–0.1)
Basophils Relative: 0.7 % (ref 0.0–3.0)
EOS ABS: 0 10*3/uL (ref 0.0–0.7)
Eosinophils Relative: 1.5 % (ref 0.0–5.0)
HEMATOCRIT: 47.8 % (ref 39.0–52.0)
HEMOGLOBIN: 16.4 g/dL (ref 13.0–17.0)
LYMPHS PCT: 24.6 % (ref 12.0–46.0)
Lymphs Abs: 0.7 10*3/uL (ref 0.7–4.0)
MCHC: 34.3 g/dL (ref 30.0–36.0)
MCV: 98.8 fl (ref 78.0–100.0)
MONOS PCT: 8.3 % (ref 3.0–12.0)
Monocytes Absolute: 0.2 10*3/uL (ref 0.1–1.0)
NEUTROS ABS: 2 10*3/uL (ref 1.4–7.7)
Neutrophils Relative %: 64.9 % (ref 43.0–77.0)
PLATELETS: 265 10*3/uL (ref 150.0–400.0)
RBC: 4.84 Mil/uL (ref 4.22–5.81)
RDW: 13.5 % (ref 11.5–15.5)
WBC: 3 10*3/uL — AB (ref 4.0–10.5)

## 2017-11-06 LAB — COMPREHENSIVE METABOLIC PANEL
ALBUMIN: 4.6 g/dL (ref 3.5–5.2)
ALK PHOS: 65 U/L (ref 39–117)
ALT: 30 U/L (ref 0–53)
AST: 27 U/L (ref 0–37)
BILIRUBIN TOTAL: 0.9 mg/dL (ref 0.2–1.2)
BUN: 11 mg/dL (ref 6–23)
CALCIUM: 9.1 mg/dL (ref 8.4–10.5)
CO2: 29 mEq/L (ref 19–32)
CREATININE: 0.95 mg/dL (ref 0.40–1.50)
Chloride: 101 mEq/L (ref 96–112)
GFR: 100.58 mL/min (ref >60.00)
Glucose, Bld: 95 mg/dL (ref 70–99)
Potassium: 4.6 mEq/L (ref 3.5–5.1)
Sodium: 135 mEq/L (ref 135–145)
TOTAL PROTEIN: 7.2 g/dL (ref 6.0–8.3)

## 2018-01-21 ENCOUNTER — Other Ambulatory Visit: Payer: Self-pay | Admitting: Internal Medicine

## 2018-01-21 NOTE — Telephone Encounter (Signed)
Pharmacy in [place] called requesting refills, patient hasnt seen any provider in  Office since Feb 2018. He will likely need to be seen in office first and to call tomorrow during regular office hours for medication refills and to discuss with Dr Hilarie Fredrickson.

## 2018-01-22 NOTE — Telephone Encounter (Signed)
Can refill Rx and get him an appt for annual continuity with me or APP

## 2018-02-07 ENCOUNTER — Ambulatory Visit (HOSPITAL_COMMUNITY)
Admission: EM | Admit: 2018-02-07 | Discharge: 2018-02-07 | Disposition: A | Discharge status: Home / Self Care | Payer: BLUE CROSS/BLUE SHIELD | Attending: Family Medicine | Admitting: Family Medicine

## 2018-02-07 ENCOUNTER — Encounter (HOSPITAL_COMMUNITY): Payer: Self-pay | Admitting: Family Medicine

## 2018-02-07 DIAGNOSIS — Z113 Encounter for screening for infections with a predominantly sexual mode of transmission: Secondary | ICD-10-CM | POA: Diagnosis not present

## 2018-02-07 DIAGNOSIS — Z79899 Other long term (current) drug therapy: Secondary | ICD-10-CM | POA: Diagnosis not present

## 2018-02-07 DIAGNOSIS — J40 Bronchitis, not specified as acute or chronic: Secondary | ICD-10-CM | POA: Insufficient documentation

## 2018-02-07 DIAGNOSIS — Z87891 Personal history of nicotine dependence: Secondary | ICD-10-CM | POA: Diagnosis not present

## 2018-02-07 DIAGNOSIS — R05 Cough: Secondary | ICD-10-CM | POA: Diagnosis present

## 2018-02-07 MED ORDER — ONDANSETRON 4 MG PO TBDP: 4.0000 mg | ORAL_TABLET | Freq: Four times a day (QID) | ORAL | 0 refills | Status: DC | PRN

## 2018-02-07 MED ORDER — DOXYCYCLINE HYCLATE 100 MG PO CAPS: 100.0000 mg | ORAL_CAPSULE | Freq: Two times a day (BID) | ORAL | 0 refills | Status: DC

## 2018-02-07 MED ORDER — FLUTICASONE PROPIONATE 50 MCG/ACT NA SUSP: 2.0000 | Freq: Every day | NASAL | 0 refills | Status: DC

## 2018-02-07 MED ORDER — BENZONATATE 100 MG PO CAPS: 100.0000 mg | ORAL_CAPSULE | Freq: Three times a day (TID) | ORAL | 0 refills | Status: DC

## 2018-02-07 MED ORDER — IPRATROPIUM BROMIDE 0.06 % NA SOLN: 2.0000 | Freq: Four times a day (QID) | NASAL | 0 refills | Status: DC

## 2018-02-07 MED ORDER — PROMETHAZINE HCL 12.5 MG PO TABS: 12.5000 mg | ORAL_TABLET | Freq: Every evening | ORAL | 0 refills | Status: DC | PRN

## 2018-02-07 MED ORDER — AZATHIOPRINE 50 MG PO TABS: 200.0000 mg | ORAL_TABLET | Freq: Every day | ORAL | 0 refills | Status: DC

## 2018-02-07 MED ORDER — DICYCLOMINE HCL 10 MG PO CAPS: 20.0000 mg | ORAL_CAPSULE | Freq: Three times a day (TID) | ORAL | 0 refills | Status: DC | PRN

## 2018-02-07 NOTE — ED Provider Notes (Addendum)
[place]    CSN: [id number] Arrival date & time: 02/07/18  1459     History   Chief Complaint Chief Complaint  Patient presents with  . Cough    HPI Jacob [name] is a 27 y.o. male.   27 year old male comes in for 1 month history of URI symptoms. States has had waxing and waning of symptoms, but has never completely cleared up. Has had productive cough, rhinorrhea, nasal congestion, sore throat. Denies fever, chills, night sweats. otc flonase and mucinex at first, then stopped medication as it wasn't helping. Has had smoking experience, but denies consistent use.   Also requesting STD testing, stating "figured while I'm here, I should get it done. Patient states he would like "everything" tested. Currently asymptomatic.      Past Medical History:  Diagnosis Date  . Acute appendicitis with rupture   . Anxiety   . Crohn disease ([place])   . 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 GERD (gastroesophageal reflux disease)   . H. pylori infection   . Hyperplastic colon polyp   . Ileitis     Patient Active Problem List   Diagnosis Date Noted  . Crohn's ileitis ([place]) 12/11/2013  . Dyspepsia 12/11/2013  . Anxiety state, unspecified 11/12/2013  . S/P interval appendectomy Dec 2013 10/25/2012  . Obesity (BMI 30-39.9) 10/18/2012    Past Surgical History:  Procedure Laterality Date  . APPENDECTOMY    . COLONOSCOPY  12/04/2013   Dr. Hilarie Fredrickson  . cyst removed  15 years ago   Right chest  . GIVENS CAPSULE STUDY N/A 07/02/2014   Procedure: GIVENS CAPSULE STUDY;  Surgeon: Jerene Bears, MD;  Location: [facility];  Service: Gastroenterology;  Laterality: N/A;  . LAPAROSCOPIC APPENDECTOMY  10/14/2012   Procedure: APPENDECTOMY LAPAROSCOPIC;  Surgeon: Pedro Earls, MD;  Location: [facility];  Service: General;  Laterality: N/A;  . LAPAROSCOPY  10/16/2012   Procedure: LAPAROSCOPY DIAGNOSTIC;  Surgeon: Pedro Earls, MD;  Location: [facility];  Service: General;  Laterality: N/A;   Placement of drain  . splinter removal  15  years ago   splinter removed, bad infection, surgery to remove fragments       Home Medications    Prior to Admission medications   Medication Sig Start Date End Date Taking? Authorizing Provider  azaTHIOprine (IMURAN) 50 MG tablet Take 4 tablets (200 mg total) by mouth daily. 10/19/16   Pyrtle, Lajuan Lines, MD  azaTHIOprine (IMURAN) 50 MG tablet Take 4 tablets (200 mg total) by mouth daily. 12/13/16   Pyrtle, Lajuan Lines, MD  benzonatate (TESSALON) 100 MG capsule Take 1 capsule (100 mg total) by mouth every 8 (eight) hours. 02/07/18   Tasia Catchings, Amy V, PA-C  dicyclomine (BENTYL) 10 MG capsule Take 2 capsules (20 mg total) by mouth every 8 (eight) hours as needed for spasms. 12/13/16   Pyrtle, Lajuan Lines, MD  doxycycline (VIBRAMYCIN) 100 MG capsule Take 1 capsule (100 mg total) by mouth 2 (two) times daily. 02/07/18   Tasia Catchings, Amy V, PA-C  fluticasone (FLONASE) 50 MCG/ACT nasal spray Place 2 sprays into both nostrils daily. 02/07/18   Tasia Catchings, Amy V, PA-C  ipratropium (ATROVENT) 0.06 % nasal spray Place 2 sprays into both nostrils 4 (four) times daily. 02/07/18   Tasia Catchings, Amy V, PA-C  ondansetron (ZOFRAN-ODT) 4 MG disintegrating tablet Take 1 tablet (4 mg total) by mouth every 6 (six) hours as needed for nausea or vomiting. 12/13/16   Pyrtle, Lajuan Lines,  MD  pantoprazole (PROTONIX) 40 MG tablet Take 1 tablet (40 mg total) by mouth 2 (two) times daily. 30-60 mins before eating 12/13/16   Pyrtle, Lajuan Lines, MD  promethazine (PHENERGAN) 12.5 MG tablet Take 1 tablet (12.5 mg total) by mouth at bedtime as needed for nausea or vomiting. 12/13/16   Pyrtle, Lajuan Lines, MD    Family History Family History  Problem Relation Age of Onset  . Cancer Paternal Grandfather        ? type  . Diabetes Paternal Grandfather   . Cancer Maternal Grandmother        ? type  . Colon cancer Neg Hx     Social History Social History   Tobacco Use  . Smoking status: Former Smoker    Types: Cigarettes  . Smokeless tobacco: Never  Used  Substance Use Topics  . Alcohol use: Yes    Alcohol/week: 3.0 oz    Types: 5 Cans of beer per week    Comment: occasional  . Drug use: No     Allergies   Patient has no known allergies.   Review of Systems Review of Systems  Reason unable to perform ROS: See HPI as above.     Physical Exam Triage Vital Signs ED Triage Vitals  Enc Vitals Group     BP 02/07/18 1524 119/88     Pulse Rate 02/07/18 1524 91     Resp 02/07/18 1524 18     Temp 02/07/18 1524 98.3 F (36.8 C)     Temp Source 02/07/18 1524 Oral     SpO2 02/07/18 1524 100 %     Weight --      Height --      Head Circumference --      Peak Flow --      Pain Score 02/07/18 1522 0     Pain Loc --      Pain Edu? --      Excl. in [place]? --    No data found.  Updated Vital Signs BP 119/88   Pulse 91   Temp 98.3 F (36.8 C) (Oral)   Resp 18   SpO2 100%   Physical Exam  Constitutional: He is oriented to person, place, and time. He appears well-developed and well-nourished. No distress.  HENT:  Head: Normocephalic and atraumatic.  Right Ear: Tympanic membrane, external ear and ear canal normal. Tympanic membrane is not erythematous and not bulging.  Left Ear: Tympanic membrane, external ear and ear canal normal. Tympanic membrane is not erythematous and not bulging.  Nose: Nose normal. Right sinus exhibits no maxillary sinus tenderness and no frontal sinus tenderness. Left sinus exhibits no maxillary sinus tenderness and no frontal sinus tenderness.  Mouth/Throat: Uvula is midline, oropharynx is clear and moist and mucous membranes are normal.  Eyes: Pupils are equal, round, and reactive to light. Conjunctivae are normal.  Neck: Normal range of motion. Neck supple.  Cardiovascular: Normal rate, regular rhythm and normal heart sounds. Exam reveals no gallop and no friction rub.  No murmur heard. Pulmonary/Chest: Effort normal and breath sounds normal. No accessory muscle usage or stridor. No respiratory  distress. He has no decreased breath sounds. He has no wheezes. He has no rhonchi. He has no rales.  Speaking in full sentences, no coughing on exam.   Lymphadenopathy:    He has no cervical adenopathy.  Neurological: He is alert and oriented to person, place, and time.  Skin: Skin is warm and dry.  Psychiatric:  He has a normal mood and affect. His behavior is normal. Judgment normal.     UC Treatments / Results  Labs (all labs ordered are listed, but only abnormal results are displayed) Labs Reviewed  HIV ANTIBODY (ROUTINE TESTING)  RPR  HSV 1 ANTIBODY, IGG  HSV 2 ANTIBODY, IGG  URINE CYTOLOGY ANCILLARY ONLY    EKG None Radiology No results found.  Procedures Procedures (including critical care time)  Medications Ordered in UC Medications - No data to display   Initial Impression / Assessment and Plan / UC Course  I have reviewed the triage vital signs and the nursing notes.  Pertinent labs & imaging results that were available during my care of the patient were reviewed by me and considered in my medical decision making (see chart for details).    Doxycycline for bronchitis. Other symptomatic treatment discussed. Push fluids.  Return precautions given.  Cytology and blood work sent, patient will be contacted with any positive results that require additional treatment. Patient to refrain from sexual activity for the next 7 days. Return precautions given.    Final Clinical Impressions(s) / UC Diagnoses   Final diagnoses:  Bronchitis  Screening examination for STD (sexually transmitted disease)    ED Discharge Orders        Ordered    doxycycline (VIBRAMYCIN) 100 MG capsule  2 times daily     02/07/18 1548    benzonatate (TESSALON) 100 MG capsule  Every 8 hours     02/07/18 1548    ipratropium (ATROVENT) 0.06 % nasal spray  4 times daily     02/07/18 1548    fluticasone (FLONASE) 50 MCG/ACT nasal spray  Daily     02/07/18 1548        Ok Edwards,  PA-C 02/07/18 Carnation, Amy V, PA-C 02/07/18 [phone]

## 2018-02-07 NOTE — Telephone Encounter (Signed)
Patient came into office today stating he needs all 5 of his medications refilled at [facility] [address] in [place]. Patient scheduled for follow up, but was requesting Dr.Pyrtle so not scheduled till 6.10.19. Patient states he is going back out of town tomorrow and wants to know if medication phenergan, protonix, zofran, bentyl and imuran call all be refilled today.

## 2018-02-07 NOTE — Telephone Encounter (Signed)
Looks like Dr Ardis Hughs has refilled meds before I have gotten to this note this evening. Nothing further appears to be needed at this time.

## 2018-02-07 NOTE — ED Triage Notes (Signed)
Pt here for cough for a few weeks. sts that he has been using OTC meds. sts mom is sick also.

## 2018-02-07 NOTE — Discharge Instructions (Addendum)
Start doxycycline for bronchitis. Tessalon for cough. Start flonase, atrovent nasal spray  for nasal congestion/drainage. You can use over the counter nasal saline rinse such as neti pot for nasal congestion. Keep hydrated, your urine should be clear to pale yellow in color. Tylenol/motrin for fever and pain. Monitor for any worsening of symptoms, chest pain, shortness of breath, wheezing, swelling of the throat, follow up for reevaluation.   For sore throat try using a honey-based tea. Use 3 teaspoons of honey with juice squeezed from half lemon. Place shaved pieces of ginger into 1/2-1 cup of water and warm over stove top. Then mix the ingredients and repeat every 4 hours as needed.  Testing sent, you will be contacted with any positive results that requires further treatment. Refrain from sexual activity and alcohol use for the next 7 days. Monitor for any worsening of symptoms, fever, abdominal pain, nausea, vomiting, to follow up for reevaluation.

## 2018-02-07 NOTE — Telephone Encounter (Signed)
He called this evening. meds were not called in yet, he's been to pharmacy to check.  Going out of town tomorrow.  I called in imuran, zofran, bentyl, phenergan.

## 2018-02-07 NOTE — ED Notes (Signed)
Pt was requesting someone call him with STD results even if they are negative. Informed him that out protocol is no news is good news, meaning all tests were negative. Pt did not agree. He was also informed that he can activate MyChart or call us in 4-5 days if he hasn't heard from [place]. Pt. Was also stating that the provider said she would give him cough syrup and when asked Cathlean Sauer, PA about cough syrup she stated she did not tell the pt that information but pt was informed of Tessalon pearls were Rx'd for his cough.

## 2018-02-08 LAB — HSV 2 ANTIBODY, IGG

## 2018-02-08 LAB — HSV 1 ANTIBODY, IGG: HSV 1 GLYCOPROTEIN G AB, IGG: 45.3 {index} — AB (ref 0.00–0.90)

## 2018-02-08 LAB — HIV ANTIBODY (ROUTINE TESTING W REFLEX): HIV Screen 4th Generation wRfx: NONREACTIVE

## 2018-02-08 LAB — RPR: RPR Ser Ql: NONREACTIVE

## 2018-02-10 LAB — URINE CYTOLOGY ANCILLARY ONLY
Chlamydia: NEGATIVE
NEISSERIA GONORRHEA: NEGATIVE
TRICH (WINDOWPATH): NEGATIVE

## 2018-02-11 ENCOUNTER — Telehealth (HOSPITAL_COMMUNITY): Payer: Self-pay

## 2018-02-11 MED ORDER — VALACYCLOVIR HCL 1 G PO TABS: 500.0000 mg | ORAL_TABLET | Freq: Two times a day (BID) | ORAL | 0 refills | Status: AC

## 2018-02-11 NOTE — Telephone Encounter (Signed)
Attempted to reach patient regarding test results, no answer at this time. Message left encouraging to call us back.

## 2018-02-11 NOTE — Telephone Encounter (Signed)
Pt returned call and informed of positive Herpes result. Requesting medication for cold sore he has had for over a month. Instructed patient that I would speak with physician and call him back.

## 2018-02-11 NOTE — Telephone Encounter (Signed)
From previous phone call patient requesting Valtrex for continued cold sore. Pt has been contacted multiple times by this RN to verify pharmacy and notify of medication being ordered for the patient. Message left instructing patient to call this RN back. Valtrex 500 mg BID x 3 days VO Tery Sanfilippo FNP has been sent to patients primary pharmacy. Instructed patient to call back if it needs to be changed to another one.

## 2018-02-17 ENCOUNTER — Telehealth: Payer: Self-pay | Admitting: *Deleted

## 2018-02-17 NOTE — Telephone Encounter (Signed)
-----   Message from Larina Bras, [place] sent at 02/10/2018  8:11 AM EDT -----   ----- Message ----- From: Jeoffrey Massed, RN Sent: 02/09/2018 To: Larina Bras, CMA  Repeat CBC and CMP in 3 months; add thiopurine metabolite panel at that time  See 12/31 note

## 2018-02-17 NOTE — Telephone Encounter (Signed)
Letter mailed to patient as he travels very often for work between here and [employer], [place].

## 2018-04-04 ENCOUNTER — Other Ambulatory Visit: Payer: Self-pay | Admitting: Gastroenterology

## 2018-04-21 ENCOUNTER — Ambulatory Visit: Payer: BLUE CROSS/BLUE SHIELD | Admitting: Internal Medicine

## 2018-04-22 ENCOUNTER — Other Ambulatory Visit: Payer: Self-pay | Admitting: Gastroenterology

## 2018-04-25 ENCOUNTER — Telehealth: Payer: Self-pay | Admitting: Internal Medicine

## 2018-04-25 NOTE — Telephone Encounter (Signed)
Patient who missed his appt on 6.10.19 due to being our of country and thinking his appt was on a different day calling to reschedule. Pt rescheduled to 8.28.19 and wants to know if he needs to get labs done before appt. Pt states he can have them done in [place] where he currently is and have them sent here. Pt also wants to know if he can get a refill on medication until appt

## 2018-04-25 NOTE — Telephone Encounter (Signed)
Please advise....  See patient's chart for previous no show history

## 2018-04-28 NOTE — Telephone Encounter (Signed)
Given his Crohn's I really do not want to interrupt his therapy, that said he does need to keep his scheduled appt Okay to refill until his Aug visit If he can have CBC, CMP drawn in [place] then we can fax a Rx

## 2018-04-29 NOTE — Telephone Encounter (Signed)
Left voice mail to call back 

## 2018-04-30 NOTE — Telephone Encounter (Signed)
Pt said he is returning your call [phone]

## 2018-05-01 NOTE — Telephone Encounter (Signed)
Left voicemail for patient to call back. 

## 2018-05-02 MED ORDER — AZATHIOPRINE 50 MG PO TABS: 200.0000 mg | ORAL_TABLET | Freq: Every day | ORAL | 1 refills | Status: DC

## 2018-05-02 MED ORDER — AZATHIOPRINE 50 MG PO TABS: 200.0000 mg | ORAL_TABLET | Freq: Every day | ORAL | 0 refills | Status: DC

## 2018-05-02 NOTE — Telephone Encounter (Signed)
Pt returning phone call best call back # [phone]. States he is out of medication.

## 2018-05-02 NOTE — Telephone Encounter (Signed)
Spoke to [employer]. He messed up the appointment dates for his follow up here. Follow up now scheduled for 07-09-2018. Rx sent to [facility] locally for the pt to have transferred. He agrees to have labs done in [place]. He will mychart the contact info of the local family doctor to send the order over.

## 2018-05-13 ENCOUNTER — Other Ambulatory Visit: Payer: Self-pay | Admitting: Internal Medicine

## 2018-06-04 ENCOUNTER — Telehealth: Payer: Self-pay | Admitting: Internal Medicine

## 2018-06-04 ENCOUNTER — Other Ambulatory Visit: Payer: Self-pay | Admitting: Internal Medicine

## 2018-06-04 NOTE — Telephone Encounter (Signed)
Pt called stating that he is due for blood work but I did not see any active orders. He is out of [place] right now and is requesting lab orders faxed to [facility] lab fax # [phone]. Phone is [phone]. Pt is at the lab at this moment, he is aware that this may not be taken care of right away, he wants a call back.

## 2018-06-05 NOTE — Telephone Encounter (Signed)
Please advise what labs you want the patient to have.

## 2018-06-09 ENCOUNTER — Other Ambulatory Visit: Payer: Self-pay

## 2018-06-09 DIAGNOSIS — K5090 Crohn's disease, unspecified, without complications: Secondary | ICD-10-CM

## 2018-06-09 NOTE — Telephone Encounter (Signed)
Left message for pt that orders have been faxed per his request.

## 2018-06-09 NOTE — Telephone Encounter (Signed)
CBC, CMP

## 2018-06-18 ENCOUNTER — Encounter: Payer: Self-pay | Admitting: *Deleted

## 2018-07-09 ENCOUNTER — Telehealth: Payer: Self-pay | Admitting: Internal Medicine

## 2018-07-09 ENCOUNTER — Ambulatory Visit: Payer: BLUE CROSS/BLUE SHIELD | Admitting: Internal Medicine

## 2018-07-09 ENCOUNTER — Encounter

## 2018-07-09 ENCOUNTER — Other Ambulatory Visit (INDEPENDENT_AMBULATORY_CARE_PROVIDER_SITE_OTHER): Payer: BLUE CROSS/BLUE SHIELD

## 2018-07-09 ENCOUNTER — Encounter: Payer: Self-pay | Admitting: Internal Medicine

## 2018-07-09 VITALS — BP 100/70 | HR 76 | Ht 67.25 in | Wt 194.2 lb

## 2018-07-09 DIAGNOSIS — K5000 Crohn's disease of small intestine without complications: Secondary | ICD-10-CM | POA: Diagnosis not present

## 2018-07-09 DIAGNOSIS — R945 Abnormal results of liver function studies: Principal | ICD-10-CM

## 2018-07-09 DIAGNOSIS — R109 Unspecified abdominal pain: Secondary | ICD-10-CM | POA: Diagnosis not present

## 2018-07-09 DIAGNOSIS — R7989 Other specified abnormal findings of blood chemistry: Secondary | ICD-10-CM

## 2018-07-09 DIAGNOSIS — K30 Functional dyspepsia: Secondary | ICD-10-CM

## 2018-07-09 DIAGNOSIS — K219 Gastro-esophageal reflux disease without esophagitis: Secondary | ICD-10-CM | POA: Diagnosis not present

## 2018-07-09 DIAGNOSIS — Z79899 Other long term (current) drug therapy: Secondary | ICD-10-CM

## 2018-07-09 LAB — CBC WITH DIFFERENTIAL/PLATELET
BASOS PCT: 0.5 % (ref 0.0–3.0)
Basophils Absolute: 0 10*3/uL (ref 0.0–0.1)
EOS ABS: 0 10*3/uL (ref 0.0–0.7)
Eosinophils Relative: 0.6 % (ref 0.0–5.0)
HCT: 47.6 % (ref 39.0–52.0)
Hemoglobin: 16.7 g/dL (ref 13.0–17.0)
LYMPHS ABS: 0.9 10*3/uL (ref 0.7–4.0)
Lymphocytes Relative: 25.6 % (ref 12.0–46.0)
MCHC: 35 g/dL (ref 30.0–36.0)
MCV: 96.3 fl (ref 78.0–100.0)
MONO ABS: 0.3 10*3/uL (ref 0.1–1.0)
Monocytes Relative: 8.8 % (ref 3.0–12.0)
NEUTROS ABS: 2.3 10*3/uL (ref 1.4–7.7)
NEUTROS PCT: 64.5 % (ref 43.0–77.0)
PLATELETS: 222 10*3/uL (ref 150.0–400.0)
RBC: 4.95 Mil/uL (ref 4.22–5.81)
RDW: 13.5 % (ref 11.5–15.5)
WBC: 3.6 10*3/uL — ABNORMAL LOW (ref 4.0–10.5)

## 2018-07-09 LAB — COMPREHENSIVE METABOLIC PANEL
ALT: 56 U/L — AB (ref 0–53)
AST: 54 U/L — ABNORMAL HIGH (ref 0–37)
Albumin: 4.9 g/dL (ref 3.5–5.2)
Alkaline Phosphatase: 76 U/L (ref 39–117)
BILIRUBIN TOTAL: 0.6 mg/dL (ref 0.2–1.2)
BUN: 9 mg/dL (ref 6–23)
CHLORIDE: 97 meq/L (ref 96–112)
CO2: 25 meq/L (ref 19–32)
CREATININE: 1.05 mg/dL (ref 0.40–1.50)
Calcium: 9.6 mg/dL (ref 8.4–10.5)
GFR: 89.17 mL/min (ref >60.00)
GLUCOSE: 86 mg/dL (ref 70–99)
Potassium: 4 mEq/L (ref 3.5–5.1)
Sodium: 131 mEq/L — ABNORMAL LOW (ref 135–145)
Total Protein: 7.9 g/dL (ref 6.0–8.3)

## 2018-07-09 LAB — HIGH SENSITIVITY CRP: CRP HIGH SENSITIVITY: 1.3 mg/L (ref 0.000–5.000)

## 2018-07-09 MED ORDER — AZATHIOPRINE 50 MG PO TABS: 200.0000 mg | ORAL_TABLET | Freq: Every day | ORAL | 0 refills | Status: DC

## 2018-07-09 MED ORDER — AZATHIOPRINE 50 MG PO TABS: 150.0000 mg | ORAL_TABLET | Freq: Every day | ORAL | 0 refills | Status: DC

## 2018-07-09 MED ORDER — ONDANSETRON 4 MG PO TBDP: ORAL_TABLET | ORAL | 0 refills | Status: DC

## 2018-07-09 MED ORDER — DICYCLOMINE HCL 20 MG PO TABS: 20.0000 mg | ORAL_TABLET | Freq: Three times a day (TID) | ORAL | 0 refills | Status: DC | PRN

## 2018-07-09 MED ORDER — PANTOPRAZOLE SODIUM 40 MG PO TBEC: 40.0000 mg | DELAYED_RELEASE_TABLET | Freq: Every day | ORAL | 1 refills | Status: DC

## 2018-07-09 NOTE — Addendum Note (Signed)
Addended by: Larina Bras on: 07/09/2018 05:20 PM   Modules accepted: Orders

## 2018-07-09 NOTE — Telephone Encounter (Signed)
Patient states he had to cancel the MRI scheduled for tomorrow 8.28.19 due to cost. Patient wanting to know if there are any other options Dr.Pyrtle would recommend.

## 2018-07-09 NOTE — Telephone Encounter (Signed)
Other options would include CT enterography or colonoscopy to assess disease activity of his ileal Crohn's disease See recent result note from labs today sent to [facility]

## 2018-07-09 NOTE — Telephone Encounter (Signed)
See note below and advise. 

## 2018-07-09 NOTE — Progress Notes (Signed)
Subjective:    Patient ID: Jacob [name], male    DOB: 15-Jul-1991, 27 y.o.   MRN: [id number]  HPI Jacob [name] is a 27 year old male with a history of Crohn's ileitis diagnosed in 2015, GERD, anxiety and likely IBS is here for follow-up.  He was last seen on 12/13/2016.  Michael has been maintained on azathioprine 200 mg daily.  He also uses dicyclomine 10 to 20 mg up to 3 times a day as needed for crampy abdominal pain.  He has a prescription for Zofran to use for intermittent nausea.  Sometime since being seen here last he saw a primary or urgent care provider in [place] and his pantoprazole was changed to ranitidine which she has been using 150 mg twice daily.  He had run out of Protonix and discussed symptoms with that provider who suggested he try ranitidine 150 twice daily.  He reports that he has a difficult time knowing if his Crohn's disease is in remission or not.  It seems that often he will have issues with mid and lower abdominal discomfort.  He describes this is "fairly frequent".  It does seem to be worse on waking in the morning and also after eating.  His bowel movements have been very regular without blood in his stool or melena.  He has some upper indigestion type symptoms and will try Tums with some relief.  No dysphagia or odynophagia.  No nausea or vomiting.  No consistent abdominal swelling or distention.  He has had lapses with azathioprine as prescriptions have run out and needed to be refilled.  He missed a follow-up or 2 with [place].  He has been out of azathioprine for the last 10 days but he states for the most part he has been consistent with this medication.  He speaks to his girlfriend who is in her pediatric residency about his symptoms and she suggested he discuss further with me as to the state of his Crohn's disease and other management options.  His last objective assessment of his Crohn's disease occurred at the time of his last colonoscopy on 11/17/2015.  This showed  scarring in the terminal ileum with scattered ulceration for about 5 cm of TI.  The colon was uninvolved.  Biopsies from ileum showed mildly active chronic ileitis.  Upper endoscopy was performed on that day which showed H. pylori for which he was treated.  Review of Systems As per HPI, otherwise negative  Current Medications, Allergies, Past Medical History, Past Surgical History, Family History and Social History were reviewed in [employer] record.     Objective:   Physical Exam Ht 5' 7.25" (1.708 m) Comment: height measured without shoes  Wt 194 lb 4 oz (88.1 kg)   BMI 30.20 kg/m  Constitutional: Well-developed and well-nourished. No distress. HEENT: Normocephalic and atraumatic. Oropharynx is clear and moist. Conjunctivae are normal.  No scleral icterus. Neck: Neck supple. Trachea midline. Cardiovascular: Normal rate, regular rhythm and intact distal pulses. No M/R/G Pulmonary/chest: Effort normal and breath sounds normal. No wheezing, rales or rhonchi. Abdominal: Soft, right lower quadrant and periumbilical tenderness with deep palpation without rebound or guarding , nondistended.  bowel sounds active throughout. There are no masses palpable. No hepatosplenomegaly. Extremities: no clubbing, cyanosis, or edema Neurological: Alert and oriented to person place and time. Skin: Skin is warm and dry. Psychiatric: Normal mood and affect. Behavior is normal.  CBC    Component Value Date/Time   WBC 3.0 (L) 11/06/2017 1329  RBC 4.84 11/06/2017 1329   HGB 16.4 11/06/2017 1329   HCT 47.8 11/06/2017 1329   PLT 265.0 11/06/2017 1329   MCV 98.8 11/06/2017 1329   MCH 27.4 10/31/2012 0836   MCHC 34.3 11/06/2017 1329   RDW 13.5 11/06/2017 1329   LYMPHSABS 0.7 11/06/2017 1329   MONOABS 0.2 11/06/2017 1329   EOSABS 0.0 11/06/2017 1329   BASOSABS 0.0 11/06/2017 1329   CMP     Component Value Date/Time   NA 135 11/06/2017 1329   K 4.6 11/06/2017 1329   CL 101  11/06/2017 1329   CO2 29 11/06/2017 1329   GLUCOSE 95 11/06/2017 1329   BUN 11 11/06/2017 1329   CREATININE 0.95 11/06/2017 1329   CALCIUM 9.1 11/06/2017 1329   PROT 7.2 11/06/2017 1329   ALBUMIN 4.6 11/06/2017 1329   AST 27 11/06/2017 1329   ALT 30 11/06/2017 1329   ALKPHOS 65 11/06/2017 1329   BILITOT 0.9 11/06/2017 1329   GFRNONAA >90 10/23/2012 0422   GFRAA >90 10/23/2012 0422        Assessment & Plan:  27 year old male with a history of Crohn's ileitis diagnosed in 2015, GERD, anxiety and likely IBS is here for follow-up.   1.  Ileal Crohn's disease --my presumption is that his ileal Crohn's disease remains mildly active, but there is also likely an intermittent component of irritable bowel overlap.  He asked about escalation of therapy particularly to involve Biologics.  I think this is reasonable but I would like an objective test first.  I recommended MR enterography for evaluation of his ileal Crohn's disease.  If still active I would recommend that we change to Humira with standard induction.  If Humira is started I would recommend we reduce azathioprine to low dose at 50 mg daily.  This should help prevent antibody formation.  Discussed the risks, benefits and alternatives to this medication. --Check CBC, CMP, QuantiFERON gold and viral hepatitis studies --I would like to see him back in November when he will be back in [place] for continuity and reassessment  2.  GERD/indigestion --we discussed pantoprazole including the long-term risks versus benefits of chronic PPI therapy.  I would expect pantoprazole to work better.  Restart pantoprazole 40 mg daily and I explained that this needs to be taken on a consistent basis.  Can still use ranitidine 150 mg in the evening as needed for breakthrough.  Tums also okay for breakthrough if needed.  40 minutes spent with the patient today. Greater than 50% was spent in counseling and coordination of care with the patient

## 2018-07-09 NOTE — Patient Instructions (Signed)
You have been scheduled for an MRI entero abdomen/pelvis at [facility] on Thursday, 07/10/18 on 2:00 pm. Your appointment time is 1:30 pm. Please arrive 15 minutes prior to your appointment time for registration purposes. Please make certain not to have anything to eat or drink 4 hours prior to your test. In addition, if you have any metal in your body, have a pacemaker or defibrillator, please be sure to let your ordering physician know. This test typically takes 45 minutes to 1 hour to complete. Should you need to reschedule, please call [phone] to do so.  Your provider has requested that you go to the basement level for lab work before leaving today. Press "B" on the elevator. The lab is located at the first door on the left as you exit the elevator.  We have sent the following medications to your pharmacy for you to pick up at your convenience: Azathioprine 200 mg daily Bentyl  Pantoprazole 40 mg daily Zofran ODT  Please follow up with Dr Hilarie Fredrickson in November 2019.  If you are age 38 or older, your body mass index should be between 23-30. Your Body mass index is 30.2 kg/m. If this is out of the aforementioned range listed, please consider follow up with your Primary Care Provider.  If you are age 15 or younger, your body mass index should be between 19-25. Your Body mass index is 30.2 kg/m. If this is out of the aformentioned range listed, please consider follow up with your Primary Care Provider.

## 2018-07-10 ENCOUNTER — Ambulatory Visit (HOSPITAL_COMMUNITY): Payer: BLUE CROSS/BLUE SHIELD

## 2018-07-10 MED ORDER — ONDANSETRON 4 MG PO TBDP: ORAL_TABLET | ORAL | 0 refills | Status: DC

## 2018-07-10 NOTE — Telephone Encounter (Signed)
Spoke with pt and he spoke with Dottie last evening and she is working on this.

## 2018-07-10 NOTE — Addendum Note (Signed)
Addended by: Larina Bras on: 07/10/2018 12:51 PM   Modules accepted: Orders

## 2018-07-10 NOTE — Telephone Encounter (Signed)
I have spoken to patient who states that he was told by his insurance company that he would need to pay several thousand dollars for an MRI enterography since it was scheduled at the hospital. However, if testing were to be completed at an outpatient facility, it would only cost him his $100.00 copay. I contacted our insurance coordinator, Amy Mason Jim about this and she states that unfortunately,patient has a $7,000 dollar deductible and currently has [phone] more to pay before deductible is met. I am unsure that even at an outpatient facility that patient would only be charged $100 given the fact that his deductible has not been paid. I told patient that MRI entero is definitely more expensive than CT enterography. CT enterography costs [phone] with $370 read fee (as quoted by [facility] in radiology) and colonoscopy, the other option given by Dr Hilarie Fredrickson would cost around $1,500 with additional cost coming from anesthesia and pathology. CT enterography could be completed at [facility], our outpatient imaging facility if patient would like since insurance has told him they will charge him only the $100 fee.  Patient asks if this is an urgent matter to get testing done. Per Dr Vena Rua note, patient requested escalation of therapy to possible biologic use such as Humira. Dr Hilarie Fredrickson wanted to get objective testing prior to doing that.   With this information, patient states that he will hold off on imaging/colonoscopy and will try dietary changes, new doses of azathioprine etc and follow up with [place] in the next month or so. He has been made aware that we will be unable to make any changes as far as escalating therapy without this testing.  Also of note, patient requested additional zofran be called to his pharmacy from #15 per month to #30 per month. This was okayed by Dr Hilarie Fredrickson verbally.

## 2018-07-11 LAB — HEPATITIS C ANTIBODY
Hepatitis C Ab: NONREACTIVE
SIGNAL TO CUT-OFF: 0.03 (ref <1.00)

## 2018-07-11 LAB — QUANTIFERON-TB GOLD PLUS
Mitogen-NIL: >10 IU/mL
NIL: 0.09 IU/mL
QUANTIFERON-TB GOLD PLUS: NEGATIVE
TB1-NIL: 0.01 [IU]/mL
TB2-NIL: 0 IU/mL

## 2018-07-11 LAB — HEPATITIS B CORE ANTIBODY, TOTAL: Hep B Core Total Ab: NONREACTIVE

## 2018-07-11 LAB — HEPATITIS B SURFACE ANTIBODY,QUALITATIVE: Hep B S Ab: NONREACTIVE

## 2018-07-11 LAB — HEPATITIS B SURFACE ANTIGEN: Hepatitis B Surface Ag: NONREACTIVE

## 2018-08-05 ENCOUNTER — Telehealth: Payer: Self-pay | Admitting: *Deleted

## 2018-08-05 NOTE — Telephone Encounter (Signed)
Patient has been reminded to have labs completed. He states that he will be in [place] and will call back if he decides to have labs done there.

## 2018-08-05 NOTE — Telephone Encounter (Signed)
-----   Message from Larina Bras, [place] sent at 07/10/2018 12:53 PM EDT ----- SEE 07/09/18 labs and telephone note.... Pt needs repeat cbc and cmet around 08/11/18 for elevated lft related to azathioprine. Orders are already in epic. Just needs drawn.

## 2018-08-31 ENCOUNTER — Other Ambulatory Visit: Payer: Self-pay | Admitting: Internal Medicine

## 2018-09-22 ENCOUNTER — Telehealth: Payer: Self-pay | Admitting: Internal Medicine

## 2018-09-22 ENCOUNTER — Other Ambulatory Visit (INDEPENDENT_AMBULATORY_CARE_PROVIDER_SITE_OTHER): Payer: BLUE CROSS/BLUE SHIELD

## 2018-09-22 DIAGNOSIS — R945 Abnormal results of liver function studies: Secondary | ICD-10-CM | POA: Diagnosis not present

## 2018-09-22 DIAGNOSIS — R7989 Other specified abnormal findings of blood chemistry: Secondary | ICD-10-CM

## 2018-09-22 LAB — CBC WITH DIFFERENTIAL/PLATELET
BASOS ABS: 0 10*3/uL (ref 0.0–0.1)
BASOS PCT: 0.5 % (ref 0.0–3.0)
Eosinophils Absolute: 0 10*3/uL (ref 0.0–0.7)
Eosinophils Relative: 1.2 % (ref 0.0–5.0)
HEMATOCRIT: 47.4 % (ref 39.0–52.0)
HEMOGLOBIN: 16.5 g/dL (ref 13.0–17.0)
LYMPHS PCT: 28.7 % (ref 12.0–46.0)
Lymphs Abs: 0.7 10*3/uL (ref 0.7–4.0)
MCHC: 34.9 g/dL (ref 30.0–36.0)
MCV: 98 fl (ref 78.0–100.0)
MONOS PCT: 10.1 % (ref 3.0–12.0)
Monocytes Absolute: 0.2 10*3/uL (ref 0.1–1.0)
NEUTROS ABS: 1.5 10*3/uL (ref 1.4–7.7)
Neutrophils Relative %: 59.5 % (ref 43.0–77.0)
Platelets: 203 10*3/uL (ref 150.0–400.0)
RBC: 4.83 Mil/uL (ref 4.22–5.81)
RDW: 14.3 % (ref 11.5–15.5)
WBC: 2.5 10*3/uL — AB (ref 4.0–10.5)

## 2018-09-22 LAB — COMPREHENSIVE METABOLIC PANEL
ALT: 16 U/L (ref 0–53)
AST: 15 U/L (ref 0–37)
Albumin: 4.6 g/dL (ref 3.5–5.2)
Alkaline Phosphatase: 65 U/L (ref 39–117)
BUN: 10 mg/dL (ref 6–23)
CO2: 29 meq/L (ref 19–32)
Calcium: 9.5 mg/dL (ref 8.4–10.5)
Chloride: 105 mEq/L (ref 96–112)
Creatinine, Ser: 1.03 mg/dL (ref 0.40–1.50)
GFR: 91.04 mL/min (ref >60.00)
GLUCOSE: 94 mg/dL (ref 70–99)
Potassium: 4.3 mEq/L (ref 3.5–5.1)
SODIUM: 139 meq/L (ref 135–145)
TOTAL PROTEIN: 7.2 g/dL (ref 6.0–8.3)
Total Bilirubin: 0.7 mg/dL (ref 0.2–1.2)

## 2018-09-22 NOTE — Telephone Encounter (Signed)
patient walked into office saying that he needs to be scheduled for follow up crohns with Dr. Hilarie Fredrickson. next available is on 11/06/18. patient will be out of town during that time and is hoping he can get in sometime in November.   Patient is requesting imuran and pantoprazole refill [facility] [address]. He is hoping to have refills sent in today because he will be traveling out of town tomorrow. Patient is requesting a callback when medications have been sent in.

## 2018-09-22 NOTE — Telephone Encounter (Signed)
Dr Hilarie Fredrickson, please advise.Marland KitchenMarland KitchenMarland Kitchen

## 2018-09-22 NOTE — Telephone Encounter (Signed)
When last we met in clinic, he was going to have an MR enterography for objective assessment for Crohn's activity prior to possible escalation of therapy to anti-TNF such as Humira The MR-E was too expensive and thus not done  My recommendation would be that he consider having a repeat colonoscopy.  This would likely be the best test to objectively assess IBD activity.  In 2017 we saw mild active chronic ileitis (Crohn's) in the TI.  If still represent on current therapy, then starting anti TNF would be recommended.  In the interim we can refill his AZA and pantoprazole, but I recommend he strongly consider the colonoscopy which would likely be better at picking up mild disease activity compared to MR-E.

## 2018-09-23 NOTE — Telephone Encounter (Signed)
Left voicemail for patient. We can refill his meds for another 3 months (see lab results from 09/23/18). After speaking with Dr Hilarie Fredrickson, he states that he really feels patient needs colonoscopy rather than office visit. He states visit would be more to discuss disease activity level. However, this was discussed at last visit and it was determined that patient would need colonoscopy to decide this.

## 2018-09-24 MED ORDER — ONDANSETRON 4 MG PO TBDP: ORAL_TABLET | ORAL | 0 refills | Status: DC

## 2018-09-24 MED ORDER — AZATHIOPRINE 50 MG PO TABS: 150.0000 mg | ORAL_TABLET | Freq: Every day | ORAL | 0 refills | Status: DC

## 2018-09-24 MED ORDER — PANTOPRAZOLE SODIUM 40 MG PO TBEC: 40.0000 mg | DELAYED_RELEASE_TABLET | Freq: Every day | ORAL | 0 refills | Status: DC

## 2018-09-24 NOTE — Telephone Encounter (Signed)
Patient is advised of lab results from 09/22/18 and is advised that he will need repeat labs in 3 months. He is also advised that Dr Hilarie Fredrickson would still like him to have a colonoscopy to assess his disease activity. Patient chose 10/07/18 to have procedure done. However, he changed his mind when advised that he would have to prep that day as he states that his plane from [place] would not be in until that day. He then tells me he could come in the evening before. I explained that he would also be prepping the evening prior to his test and would be unable to travel. Patient went back and forth on dates but decided he thought he would just come in early 10/06/18. I had spoken to him about previsit and getting instructions/signing paperwork. Upon attempting to schedule this, patient indicated that he would be unable to have any previsit prior to his arrival on 10/06/18, which would be the day prior to his procedure, and not enough time to prep. He states he will call back tomorrow morning with a different plan and a list of the dates that he will be here in town to have the procedure done.   I have sent a 3 month supply of azathioprine and protonix to [place], [place] for patient at his request. In addition, he would like Zofran sent to his pharmacy. Per Dr Hilarie Fredrickson, this is fine.

## 2018-09-30 ENCOUNTER — Other Ambulatory Visit: Payer: Self-pay | Admitting: Internal Medicine

## 2018-10-07 ENCOUNTER — Encounter: Payer: BLUE CROSS/BLUE SHIELD | Admitting: Internal Medicine

## 2018-10-14 ENCOUNTER — Encounter: Payer: Self-pay | Admitting: Internal Medicine

## 2018-10-20 ENCOUNTER — Ambulatory Visit (AMBULATORY_SURGERY_CENTER): Payer: Self-pay

## 2018-10-20 VITALS — Ht 68.0 in | Wt 185.2 lb

## 2018-10-20 DIAGNOSIS — K5000 Crohn's disease of small intestine without complications: Secondary | ICD-10-CM

## 2018-10-20 MED ORDER — NA SULFATE-K SULFATE-MG SULF 17.5-3.13-1.6 GM/177ML PO SOLN: 1.0000 | Freq: Once | ORAL | 0 refills | Status: AC

## 2018-10-20 NOTE — Progress Notes (Signed)
Denies allergies to eggs or soy products. Denies complication of anesthesia or sedation. Denies use of weight loss medication. Denies use of O2.   Emmi instructions given declined.   A pay no more than 50.00 coupon was given to the patient.

## 2018-11-06 ENCOUNTER — Encounter: Payer: Self-pay | Admitting: Internal Medicine

## 2018-11-06 ENCOUNTER — Telehealth: Payer: Self-pay | Admitting: Internal Medicine

## 2018-11-06 NOTE — Telephone Encounter (Signed)
Jasmine from [employer] calling stating the patient is coming into their office to receive the Yellow fever vaccine, but they need a letter faxed to [phone] from Dr.Pyrtle approving pt to get vaccine and stating he is aware this is a live vaccine. Patient is on his way to the office now. Please call office at [phone] for questions.

## 2018-11-06 NOTE — Telephone Encounter (Signed)
See note below from Dr. Hilarie Fredrickson and advise.

## 2018-11-06 NOTE — Telephone Encounter (Signed)
Per Dr. Hilarie Fredrickson pt may not have yellow fever vaccine as he is taking immunosuppressive medications.

## 2018-11-20 ENCOUNTER — Ambulatory Visit (AMBULATORY_SURGERY_CENTER): Payer: BLUE CROSS/BLUE SHIELD | Admitting: Internal Medicine

## 2018-11-20 ENCOUNTER — Encounter: Payer: Self-pay | Admitting: Internal Medicine

## 2018-11-20 VITALS — BP 102/62 | HR 67 | Temp 98.9°F | Resp 15 | Ht 67.0 in | Wt 194.0 lb

## 2018-11-20 DIAGNOSIS — K5000 Crohn's disease of small intestine without complications: Secondary | ICD-10-CM

## 2018-11-20 MED ORDER — ONDANSETRON HCL 4 MG PO TABS: 4.0000 mg | ORAL_TABLET | Freq: Three times a day (TID) | ORAL | 5 refills | Status: DC | PRN

## 2018-11-20 MED ORDER — SODIUM CHLORIDE 0.9 % IV SOLN: 500.0000 mL | Freq: Once | INTRAVENOUS | Status: DC

## 2018-11-20 NOTE — Patient Instructions (Addendum)
  Continue Azaythioprine 142m daily.  Restart Zofran 435mevery 8 hours prn.  YOU HAD AN ENDOSCOPIC PROCEDURE TODAY AT [facility]:   Refer to the procedure report that was given to you for any specific questions about what was found during the examination.  If the procedure report does not answer your questions, please call your gastroenterologist to clarify.  If you requested that your care partner not be given the details of your procedure findings, then the procedure report has been included in a sealed envelope for you to review at your convenience later.  YOU SHOULD EXPECT: Some feelings of bloating in the abdomen. Passage of more gas than usual.  Walking can help get rid of the air that was put into your GI tract during the procedure and reduce the bloating. If you had a lower endoscopy (such as a colonoscopy or flexible sigmoidoscopy) you may notice spotting of blood in your stool or on the toilet paper. If you underwent a bowel prep for your procedure, you may not have a normal bowel movement for a few days.  Please Note:  You might notice some irritation and congestion in your nose or some drainage.  This is from the oxygen used during your procedure.  There is no need for concern and it should clear up in a day or so.  SYMPTOMS TO REPORT IMMEDIATELY:   Following lower endoscopy (colonoscopy or flexible sigmoidoscopy):  Excessive amounts of blood in the stool  Significant tenderness or worsening of abdominal pains  Swelling of the abdomen that is new, acute  Fever of 100F or higher For urgent or emergent issues, a gastroenterologist can be reached at any hour by calling ([phone]  DIET:  We do recommend a small meal at first, but then you may proceed to your regular diet.  Drink plenty of fluids but you should avoid alcoholic beverages for 24 hours.  ACTIVITY:  You should plan to take it easy for the rest of today and you should NOT DRIVE or use heavy  machinery until tomorrow (because of the sedation medicines used during the test).    FOLLOW UP: Our staff will call the number listed on your records the next business day following your procedure to check on you and address any questions or concerns that you may have regarding the information given to you following your procedure. If we do not reach you, we will leave a message.  However, if you are feeling well and you are not experiencing any problems, there is no need to return our call.  We will assume that you have returned to your regular daily activities without incident.  If any biopsies were taken you will be contacted by phone or by letter within the next 1-3 weeks.  Please call [place] ([phone] you have not heard about the biopsies in 3 weeks.    SIGNATURES/CONFIDENTIALITY: You and/or your care partner have signed paperwork which will be entered into your electronic medical record.  These signatures attest to the fact that that the information above on your After Visit Summary has been reviewed and is understood.  Full responsibility of the confidentiality of this discharge information lies with you and/or your care-partner.

## 2018-11-20 NOTE — Progress Notes (Signed)
To PACU, VSS. Report to [email or web address] 

## 2018-11-20 NOTE — Op Note (Signed)
[facility] Patient Name: Jacob [name] Procedure Date: 11/20/2018 7:58 AM MRN: [id number] Endoscopist: Jerene Bears , MD Age: 28 Referring MD:  Date of Birth: [birth year] Gender: Male Account #: [id number] Procedure:                Colonoscopy Indications:              Disease activity assessment of Crohn's disease of                            the small bowel, Assess therapeutic response to                            therapy of Crohn's disease of the small bowel Medicines:                Monitored Anesthesia Care Procedure:                After obtaining informed consent, the colonoscope                            was passed under direct vision. Throughout the                            procedure, the patient's blood pressure, pulse, and                            oxygen saturations were monitored continuously. The                            Colonoscope was introduced through the anus and                            advanced to the terminal ileum. The colonoscopy was                            performed without difficulty. The patient tolerated                            the procedure well. The quality of the bowel                            preparation was excellent. The terminal ileum,                            ileocecal valve, appendiceal orifice, and rectum                            were photographed. Scope In: 8:19:34 AM Scope Out: 8:29:22 AM Scope Withdrawal Time: 0 hours 7 minutes 5 seconds  Total Procedure Duration: 0 hours 9 minutes 48 seconds  Findings:                 The terminal ileum appeared normal. There was no                            evidence of active Crohn's disease  in the examined                            distal ileum (10 cm).                           The entire examined colon appeared normal on direct                            and retroflexion views. Complications:            No immediate complications. Estimated Blood Loss:     Estimated blood  loss: none. Impression:               - The examined portion of the ileum was normal.                            Ileal Crohn's disease in remission.                           - The entire examined colon is normal on direct and                            retroflexion views.                           - No specimens collected. Recommendation:           - Patient has a contact number available for                            emergencies. The signs and symptoms of potential                            delayed complications were discussed with the                            patient. Return to normal activities tomorrow.                            Written discharge instructions were provided to the                            patient.                           - Resume previous diet.                           - Continue present medications. Continue                            azathioprine 150 mg daily.                           - No recommendation at this time regarding repeat  colonoscopy due to young age. Jerene Bears, MD 11/20/2018 8:40:00 AM This report has been signed electronically.

## 2018-11-20 NOTE — Progress Notes (Signed)
Pt's states no medical or surgical changes since previsit or office visit. 

## 2018-11-21 ENCOUNTER — Telehealth: Payer: Self-pay

## 2018-11-21 ENCOUNTER — Telehealth: Payer: Self-pay | Admitting: Internal Medicine

## 2018-11-21 MED ORDER — ONDANSETRON 4 MG PO TBDP: 4.0000 mg | ORAL_TABLET | Freq: Three times a day (TID) | ORAL | 0 refills | Status: DC | PRN

## 2018-11-21 NOTE — Telephone Encounter (Signed)
Patient is supposed to get zofran ODT tablets as previously. We will send script.

## 2018-11-21 NOTE — Telephone Encounter (Signed)
  Follow up Call-  Call back number 11/20/2018  Post procedure Call Back phone  # [phone]  Permission to leave phone message Yes  Some recent data might be hidden     Patient questions:  Do you have a fever, pain , or abdominal swelling? No. Pain Score  0 *  Have you tolerated food without any problems? Yes.    Have you been able to return to your normal activities? Yes.    Do you have any questions about your discharge instructions: Diet   No. Medications  No. Follow up visit  No.  Do you have questions or concerns about your Care? No.  Actions: * If pain score is 4 or above: No action needed, pain <4.

## 2018-11-21 NOTE — Telephone Encounter (Signed)
Left message on f/u call 

## 2018-11-21 NOTE — Telephone Encounter (Signed)
Pt states that prescription for zofran that was sent to his pharmacy was not the correct one. He states that he has taken a dissolvable form instead of pill form, he is also requesting a 90-day supply instead of one month.

## 2018-12-22 ENCOUNTER — Other Ambulatory Visit: Payer: Self-pay | Admitting: Internal Medicine

## 2018-12-23 ENCOUNTER — Other Ambulatory Visit: Payer: Self-pay

## 2018-12-23 DIAGNOSIS — K5000 Crohn's disease of small intestine without complications: Secondary | ICD-10-CM

## 2018-12-26 DIAGNOSIS — Z0000 Encounter for general adult medical examination without abnormal findings: Secondary | ICD-10-CM | POA: Diagnosis not present

## 2018-12-26 DIAGNOSIS — F419 Anxiety disorder, unspecified: Secondary | ICD-10-CM | POA: Diagnosis not present

## 2018-12-26 DIAGNOSIS — J029 Acute pharyngitis, unspecified: Secondary | ICD-10-CM | POA: Diagnosis not present

## 2018-12-26 DIAGNOSIS — Z113 Encounter for screening for infections with a predominantly sexual mode of transmission: Secondary | ICD-10-CM | POA: Diagnosis not present

## 2018-12-31 DIAGNOSIS — Z113 Encounter for screening for infections with a predominantly sexual mode of transmission: Secondary | ICD-10-CM | POA: Diagnosis not present

## 2019-01-01 DIAGNOSIS — Z113 Encounter for screening for infections with a predominantly sexual mode of transmission: Secondary | ICD-10-CM | POA: Diagnosis not present

## 2019-01-23 ENCOUNTER — Telehealth: Payer: Self-pay | Admitting: Internal Medicine

## 2019-01-23 MED ORDER — AZATHIOPRINE 50 MG PO TABS: 150.0000 mg | ORAL_TABLET | Freq: Every day | ORAL | 0 refills | Status: DC

## 2019-01-23 NOTE — Telephone Encounter (Signed)
Pt would like a refill for: azaTHIOprine (IMURAN) 50 MG  pharmacy is : [facility] [place] [address]

## 2019-01-23 NOTE — Telephone Encounter (Signed)
I told him that they will be sent for the weekend and told him that he needs to call Monday with the fax number of where he had his labs done.

## 2019-01-23 NOTE — Telephone Encounter (Signed)
Patient needs labs done as he was asked to do on 09/22/18, 12/23/18 and 01/21/19 (written on paper refill request). Due to his history of elevated liver enzymes with his azathioprine, it is imperative that he has labwork when requested. I will give a weekend supply of medication until he calls with fax number where he is having labwork completed so I can send orders. Unfortunately, we cannot continue to refill medication without lab results.

## 2019-01-24 DIAGNOSIS — E663 Overweight: Secondary | ICD-10-CM | POA: Diagnosis not present

## 2019-01-24 DIAGNOSIS — Z6828 Body mass index (BMI) 28.0-28.9, adult: Secondary | ICD-10-CM | POA: Diagnosis not present

## 2019-01-24 DIAGNOSIS — Z113 Encounter for screening for infections with a predominantly sexual mode of transmission: Secondary | ICD-10-CM | POA: Diagnosis not present

## 2019-01-28 ENCOUNTER — Other Ambulatory Visit: Payer: Self-pay | Admitting: Internal Medicine

## 2019-01-28 MED ORDER — AZATHIOPRINE 50 MG PO TABS: 150.0000 mg | ORAL_TABLET | Freq: Every day | ORAL | 0 refills | Status: DC

## 2019-01-28 NOTE — Telephone Encounter (Signed)
I have spoken to Dr Hilarie Fredrickson. Per Dr Hilarie Fredrickson, until patient has labwork, we will only be giving medication 1 week at a time. I will send 1 week worth of medication to pharmacy.

## 2019-01-28 NOTE — Telephone Encounter (Signed)
Pt called with the Fax# and address where it needs to be sent.  Pt Would like a conformation that it has also been sent.  Address: [address] [place] Fax# [phone]

## 2019-01-28 NOTE — Telephone Encounter (Signed)
Left message for patient to call back  

## 2019-01-28 NOTE — Addendum Note (Signed)
Addended by: Larina Bras on: 01/28/2019 05:32 PM   Modules accepted: Orders

## 2019-02-25 ENCOUNTER — Telehealth: Payer: Self-pay | Admitting: Internal Medicine

## 2019-02-25 NOTE — Telephone Encounter (Signed)
Orders faxed.  Patient is advised that he will need to have labs drawn and reviewed by Dr. Hilarie Fredrickson before refills will be given.

## 2019-02-25 NOTE — Telephone Encounter (Signed)
Pt requesting phone call back once it has been done

## 2019-03-23 ENCOUNTER — Other Ambulatory Visit: Payer: Self-pay | Admitting: Internal Medicine

## 2019-03-23 DIAGNOSIS — K5000 Crohn's disease of small intestine without complications: Secondary | ICD-10-CM | POA: Diagnosis not present

## 2019-03-25 LAB — COMPREHENSIVE METABOLIC PANEL
ALT: 15 IU/L (ref 0–44)
AST: 16 IU/L (ref 0–40)
Albumin/Globulin Ratio: 2.3 — ABNORMAL HIGH (ref 1.2–2.2)
Albumin: 4.8 g/dL (ref 4.1–5.2)
Alkaline Phosphatase: 71 IU/L (ref 39–117)
BUN/Creatinine Ratio: 11 (ref 9–20)
BUN: 10 mg/dL (ref 6–20)
Bilirubin Total: 0.5 mg/dL (ref 0.0–1.2)
CO2: 22 mmol/L (ref 20–29)
Calcium: 9.4 mg/dL (ref 8.7–10.2)
Chloride: 100 mmol/L (ref 96–106)
Creatinine, Ser: 0.93 mg/dL (ref 0.76–1.27)
GFR calc Af Amer: 128 mL/min/{1.73_m2} (ref >59)
GFR calc non Af Amer: 111 mL/min/{1.73_m2} (ref >59)
Globulin, Total: 2.1 g/dL (ref 1.5–4.5)
Glucose: 90 mg/dL (ref 65–99)
Potassium: 4.4 mmol/L (ref 3.5–5.2)
Sodium: 138 mmol/L (ref 134–144)
Total Protein: 6.9 g/dL (ref 6.0–8.5)

## 2019-03-25 LAB — SPECIMEN STATUS REPORT

## 2019-03-26 DIAGNOSIS — B009 Herpesviral infection, unspecified: Secondary | ICD-10-CM | POA: Diagnosis not present

## 2019-03-26 DIAGNOSIS — G4700 Insomnia, unspecified: Secondary | ICD-10-CM | POA: Diagnosis not present

## 2019-03-26 DIAGNOSIS — F419 Anxiety disorder, unspecified: Secondary | ICD-10-CM | POA: Diagnosis not present

## 2019-04-17 ENCOUNTER — Telehealth: Payer: Self-pay

## 2019-04-17 MED ORDER — AZATHIOPRINE 50 MG PO TABS: 150.0000 mg | ORAL_TABLET | Freq: Every day | ORAL | 1 refills | Status: DC

## 2019-04-17 MED ORDER — PANTOPRAZOLE SODIUM 40 MG PO TBEC: 40.0000 mg | DELAYED_RELEASE_TABLET | Freq: Every day | ORAL | 1 refills | Status: DC

## 2019-04-17 MED ORDER — ONDANSETRON HCL 4 MG PO TABS: 4.0000 mg | ORAL_TABLET | Freq: Three times a day (TID) | ORAL | 1 refills | Status: DC | PRN

## 2019-04-17 MED ORDER — DICYCLOMINE HCL 20 MG PO TABS: 20.0000 mg | ORAL_TABLET | Freq: Three times a day (TID) | ORAL | 1 refills | Status: DC | PRN

## 2019-04-17 NOTE — Telephone Encounter (Signed)
Dr. Hilarie Fredrickson reviewed labs drawn at [facility]. Per Dr. Hilarie Fredrickson lab results look good, pt to same on same meds at same doses and needs to repeat labs in 6 mth. Dr.Pyrtle states there is no need for an OV at present.Pt requests refills on Imuran, bentyl, protonix, and zofran. Refills sent to pharmacy and pt aware.

## 2019-05-21 DIAGNOSIS — F419 Anxiety disorder, unspecified: Secondary | ICD-10-CM | POA: Diagnosis not present

## 2019-05-21 DIAGNOSIS — G4700 Insomnia, unspecified: Secondary | ICD-10-CM | POA: Diagnosis not present

## 2019-07-13 ENCOUNTER — Telehealth: Payer: Self-pay | Admitting: Internal Medicine

## 2019-07-14 NOTE — Telephone Encounter (Signed)
Left message advising patient that we would need to know what pharmacy he needs medication to and also advised that insurance may or may not allow early fill of medication. Asked that he call us back.

## 2019-07-15 MED ORDER — AZATHIOPRINE 50 MG PO TABS: 150.0000 mg | ORAL_TABLET | Freq: Every day | ORAL | 0 refills | Status: DC

## 2019-07-15 MED ORDER — ONDANSETRON 4 MG PO TBDP: 4.0000 mg | ORAL_TABLET | Freq: Three times a day (TID) | ORAL | 0 refills | Status: DC | PRN

## 2019-07-15 MED ORDER — PANTOPRAZOLE SODIUM 40 MG PO TBEC: 40.0000 mg | DELAYED_RELEASE_TABLET | Freq: Every day | ORAL | 0 refills | Status: DC

## 2019-07-15 MED ORDER — DICYCLOMINE HCL 20 MG PO TABS: 20.0000 mg | ORAL_TABLET | Freq: Three times a day (TID) | ORAL | 0 refills | Status: DC | PRN

## 2019-07-15 NOTE — Addendum Note (Signed)
Addended by: Larina Bras on: 07/15/2019 05:41 PM   Modules accepted: Orders

## 2019-07-15 NOTE — Telephone Encounter (Signed)
Pt returned your call, [facility] [address]. He states that he needs full rf on meds because he lost medications, he stated that they were in a bag that had lost when he moved over here. He also stated that last time he rf meds he did it through [facility] not his insurance so there should be not problem about refilling meds. He has been out of his meds for several days now and would like this taken care of asap. He would like a call back when this is taken care of.

## 2019-07-22 DIAGNOSIS — Z1159 Encounter for screening for other viral diseases: Secondary | ICD-10-CM | POA: Diagnosis not present

## 2019-07-22 DIAGNOSIS — Z20828 Contact with and (suspected) exposure to other viral communicable diseases: Secondary | ICD-10-CM | POA: Diagnosis not present

## 2019-09-24 DIAGNOSIS — F419 Anxiety disorder, unspecified: Secondary | ICD-10-CM | POA: Diagnosis not present

## 2019-09-26 DIAGNOSIS — Z20828 Contact with and (suspected) exposure to other viral communicable diseases: Secondary | ICD-10-CM | POA: Diagnosis not present

## 2019-09-29 DIAGNOSIS — Z03818 Encounter for observation for suspected exposure to other biological agents ruled out: Secondary | ICD-10-CM | POA: Diagnosis not present

## 2019-10-04 DIAGNOSIS — Z03818 Encounter for observation for suspected exposure to other biological agents ruled out: Secondary | ICD-10-CM | POA: Diagnosis not present

## 2019-10-27 DIAGNOSIS — F419 Anxiety disorder, unspecified: Secondary | ICD-10-CM | POA: Diagnosis not present

## 2019-11-25 DIAGNOSIS — Z20828 Contact with and (suspected) exposure to other viral communicable diseases: Secondary | ICD-10-CM | POA: Diagnosis not present

## 2019-11-27 DIAGNOSIS — K219 Gastro-esophageal reflux disease without esophagitis: Secondary | ICD-10-CM | POA: Diagnosis not present

## 2019-11-27 DIAGNOSIS — F419 Anxiety disorder, unspecified: Secondary | ICD-10-CM | POA: Diagnosis not present

## 2019-11-27 DIAGNOSIS — J029 Acute pharyngitis, unspecified: Secondary | ICD-10-CM | POA: Diagnosis not present

## 2019-11-27 DIAGNOSIS — B370 Candidal stomatitis: Secondary | ICD-10-CM | POA: Diagnosis not present

## 2019-11-28 DIAGNOSIS — J029 Acute pharyngitis, unspecified: Secondary | ICD-10-CM | POA: Diagnosis not present

## 2019-11-28 DIAGNOSIS — K219 Gastro-esophageal reflux disease without esophagitis: Secondary | ICD-10-CM | POA: Diagnosis not present

## 2020-02-01 DIAGNOSIS — F419 Anxiety disorder, unspecified: Secondary | ICD-10-CM | POA: Diagnosis not present

## 2020-02-01 DIAGNOSIS — Z7189 Other specified counseling: Secondary | ICD-10-CM | POA: Diagnosis not present

## 2020-02-16 DIAGNOSIS — Z23 Encounter for immunization: Secondary | ICD-10-CM | POA: Diagnosis not present

## 2020-02-18 ENCOUNTER — Other Ambulatory Visit: Payer: Self-pay | Admitting: Internal Medicine

## 2020-03-03 ENCOUNTER — Other Ambulatory Visit: Payer: Self-pay | Admitting: Internal Medicine

## 2020-03-04 NOTE — Telephone Encounter (Signed)
Can refill once, but it is time for him to be seen

## 2020-03-04 NOTE — Telephone Encounter (Signed)
Patient last seen 11/2018 for procedure for crohns. Last office visit 07/09/18. Last labs 03/2019. He is *supposed* to be on azathioprine although I am unsure as to how compliant patient is with this. Patient is now requesting refills of dicyclomine. Do you want me to refill as is or does he need to schedule office visit first? I want to go ahead and ask as I know he will want you to be the one to tell him he needs a visit rather than me.

## 2020-05-05 ENCOUNTER — Other Ambulatory Visit: Payer: Self-pay | Admitting: Internal Medicine

## 2020-05-05 MED ORDER — ONDANSETRON HCL 4 MG PO TABS: 4.0000 mg | ORAL_TABLET | Freq: Three times a day (TID) | ORAL | 0 refills | Status: DC | PRN

## 2020-05-05 NOTE — Telephone Encounter (Signed)
Patient had CBC, CMP and lipid profile on 05/03/20 by Dr Roque Lias office. See labs under "media" tab in Epic. CBC and CMP were WNL.  Medications refilled.

## 2020-05-05 NOTE — Telephone Encounter (Signed)
Can be refilled only after CBC, CMP are drawn

## 2020-06-21 DIAGNOSIS — Z20828 Contact with and (suspected) exposure to other viral communicable diseases: Secondary | ICD-10-CM | POA: Diagnosis not present

## 2020-07-07 DIAGNOSIS — Z20822 Contact with and (suspected) exposure to covid-19: Secondary | ICD-10-CM | POA: Diagnosis not present

## 2020-08-01 ENCOUNTER — Other Ambulatory Visit: Payer: Self-pay | Admitting: Internal Medicine

## 2020-08-01 NOTE — Telephone Encounter (Signed)
Yes, needs repeat labs in Dec 2021

## 2020-08-01 NOTE — Telephone Encounter (Signed)
Continue refills or office visit needed? Last OV 07/09/18, last colon 11/20/18. Labs 04/2020.

## 2020-08-15 DIAGNOSIS — B009 Herpesviral infection, unspecified: Secondary | ICD-10-CM | POA: Diagnosis not present

## 2020-08-15 DIAGNOSIS — F419 Anxiety disorder, unspecified: Secondary | ICD-10-CM | POA: Diagnosis not present

## 2020-10-09 DIAGNOSIS — R059 Cough, unspecified: Secondary | ICD-10-CM | POA: Diagnosis not present

## 2020-10-09 DIAGNOSIS — J22 Unspecified acute lower respiratory infection: Secondary | ICD-10-CM | POA: Diagnosis not present

## 2020-10-10 DIAGNOSIS — Z03818 Encounter for observation for suspected exposure to other biological agents ruled out: Secondary | ICD-10-CM | POA: Diagnosis not present

## 2020-10-10 DIAGNOSIS — R059 Cough, unspecified: Secondary | ICD-10-CM | POA: Diagnosis not present

## 2020-11-05 ENCOUNTER — Ambulatory Visit: Payer: BLUE CROSS/BLUE SHIELD

## 2020-11-05 DIAGNOSIS — Z20822 Contact with and (suspected) exposure to covid-19: Secondary | ICD-10-CM | POA: Diagnosis not present

## 2020-11-05 DIAGNOSIS — U071 COVID-19: Secondary | ICD-10-CM | POA: Diagnosis not present

## 2020-11-05 NOTE — Progress Notes
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, has scheduled for next week    SYMPTOMS:   FEVER-> 101 at 7am  CHILLS  FATIGUE  MUSCLE ACHES  HEADACHE  RUNNY NOSE    Denies cough, sob, loss of taste or smell, sore throat, n/v/d, abd pain, rash    History of crohn's  Meds:  azithioprine 150 every day  Dicyclomine prn  Pantoprazole every day  alprazolam      RISK FACTORS:  H/o crohns  SMOKING->no  Immunocompromised or high risk medical condition-> Crohns  Lives with HCW (resident at [facility]+      High Risk Symptoms  [ ]  Cough  [ ]  Fever  [ ]  Shortness of Breath  [ ]  GI symptoms (abdominal pain, diarrhea)    Risk Factors  [ ]  Exposure to confirmed COVID-19 within 14 days prior to symptom onset.  [ ]  Research scientist (physical sciences)  [ ]  Immunocompromised or with high risk medical condition  [ ]  60 years or older  [ ]  Lives with others who are immunocompromised or with high risk medical condition  [ ]  Group Living home (nursing home, dorms, etc)  + crohns    PCP: No primary care provider on file.   PCP Telephone: None    No past medical history on file.       Current Outpatient Medications   Medication Sig   ??? azaTHIOprine 50 mg tablet Take 50 mg by mouth three (3) times daily.   ??? pantoprazole 40 mg DR tablet Take 40 mg by mouth once.     No current facility-administered medications for this visit.       No Known Allergies      Past medical history, past surgical history, family history, and social history all reviewed, updated, and noted in HPI if significant.       Last Recorded Vital Signs:    11/05/20 0917   BP: 115/75   Pulse: 98   Temp: 37.8 ???C (100.1 ???F)   SpO2: 96%       Gen:  A&O x 3, WDWN,  NAD               Non toxic and well appearing Fever 100.1  ENT:    EOMI, conj clear  Neck:   Supple  CV:  RRR, no m, g, r  Pulm:  CTA B, no w/r/r  Abd:     Benign   Neuro:  Non-focal exam    covid influ swab pending    IMP  ILI          R/o covid/influenza    Assessment & Plan:    - Nasopharyngeal swab samples obtained in clinic for COVID testing.  - Patient provided with information regarding the need to isolate at home, social distancing, practice strict hygiene, and monitor for signs and symptoms of worsening health conditions including  respiratory difficulties.  - Additional education provided to patient on symptom management and follow up should they develop worsening symptoms including respiratory difficulties.   - Patient was also advised to inform healthcare providers and emergency responders of their current screening status should additional treatment be needed.  Patient aware a negative test may be due to pre-symptomatic status or a false neg    STAY HOME IF YOU ARE SICK    ??? Most people with Covid will have mild illness and will get better without needing to see a doctor  ??? Treatment includes taking fluids, rest and over the counter medications  ??? Call you doctor if you are 65 years and older, pregnant, or have a health condition such as heart disease, lung disease, diabetes, kidney disease, or a weakened immune system.    SEEK MEDICAL CARE IF YOUR ARE SERIOUSLY SICK    ??? Difficulty breathing  ??? Can't keep fluids down  ??? Dehydration  ??? Confusion  ??? Bluish lips  ??? Other serious symptoms    HELP PROTECT THE COMMUNITY    ??? Clean your hands well and often, preferably with soap and water  ??? Distance and separate yourself from people in your home.  ??? Wear a mask if you do need to be around other people  ??? Stay home until at least 10 days after your symptoms started AND at lease 1 day after improvement and no fever without antipyretics (tylenol, ibuprofen).    The plan of care, diagnosis, orders, risks and benefits related to the medical issues pertinent to this encounter, and follow-up recommendations were discussed with the patient.   Questions related to the recommended plan of care were answered.   See aci for additional information.    Patient agrees with plan and verbalized understanding  Patient is being discharged with suspected Covid-19.  Patient does not currently require respiratory support or supplemental oxygen.  Patient does not meet 911 or admission criteria.  I have informed the patient of my suspected diagnosis.  Provided return precautions, and recommended self isolation.  I also told the patient to contact their primary care physician with further questions.  Patient indicates understanding of the serious, and potentially lethal nature of the disease, and that the current situation dictates further home observation and to go to the nearest ER or call 911 if becomes severely short of breath or experiences other potentially life threatening symptoms        Addendum:  Spoke with patient, aware of + covid result  Reviewed cdc guidelines, need to inform contacts, ER precautions  Will refer for monoclonal antibodies, this was also discussed with him  PH form sent.

## 2020-11-05 NOTE — Patient Instructions
Your test results for COVID-19 are Positive.     This means that COVID-19 (coronavirus) was detected in your test specimen and you have been diagnosed with the illness. You should not panic. For the majority of those testing positive, COVID-19 produces mild symptoms. Only in a small subset of patients does the virus cause serious illness. If you have not already done so, please isolate yourself at home immediately. You can discontinue the following home care instructions when BOTH the following conditions are met:  * At least 1 day (24 hours) have passed since recovery, defined as having no fever, without the use of fever-reducing medications (such as Acetaminophen or Ibuprofen) and improvement in symptoms (e.g., cough, shortness of breath); AND  * At least 10 days have passed since symptoms first appeared.    Most people with COVID-19 have mild symptoms like cough, fever, and runny nose. However, if your symptoms worsen, particularly if you have difficulty breathing, contact your medical provider right away. Tell your provider's office that you have been diagnosed with COVID-19. This will help them ensure you get the best possible care. If you have a medical emergency, call 911 and let them know you have been diagnosed with COVID-19. If possible, wear a facemask to minimize exposing others to the virus.  * Continue over the counter medications for your symptoms, as needed (more information on symptom management below).  * Please contact your medical provider if you develop new or worsening symptoms.    Information for COVID-19 patients (or suspected) who are not hospitalized    1. Stay home. Do not leave your home, except to get medical care, until your health care provider says it is OK. Do not go to work, school, or public areas, and do not use public transportation or taxis.    2. Separate yourself from other people in your home. As much as possible, stay in a different room from other people in your home. If possible, use a separate bathroom. If you must be in the same room as other people, wear a facemask to prevent spreading germs to others. It is recommended that those with whom you have had close contact be tested for COVID-19 between 3 and 5 days after exposure to you.    3. Before you visit your doctors, let them know. Call ahead before visiting your doctor so they can prepare for your visit and know that you may have COVID-19.    4. Cover coughs and sneezes. To prevent spreading germs to others, when coughing or sneezing cover your mouth and nose with a tissue or your sleeve. Throw used tissues in a lined trash can, and immediately wash hands with soap and water.    5. Keep hands clean. Wash hands often and thoroughly with soap and water for at least 20 seconds. Use alcohol-based hand sanitizer if soap and water are not available and if hands are not visibly dirty. Avoid touching eyes, nose, and mouth with unwashed hands.    6. Avoid sharing household items. Do not share dishes, drinking glasses, cups, eating utensils, towels, bedding, or other items with other people in the home. These items should be washed thoroughly after use with soap and warm water.    7. Monitor illness. If illness gets worse (trouble breathing, pain in chest), get medical care right away. Before, call your health care provider and tell them that you have, or might have, COVID-19 infection. This will help your provider to take steps to keep other  people from getting infected.    General Symptom Management ??? unless otherwise stated by your medical provider  * Currently there is no specific treatment or cure for COVID-19.  * Get plenty of rest and conserve your energy (avoid strenuous physical activity).  * Drink lots of fluids (water or water+ electrolytes (e.g. Pedialyte)). Warm drinks with or without honey can sooth sore throats and decrease phlegm (airway mucous/congestion).  * Fevers or body aches: Acetaminophen (e.g. Tylenol) 500 - 1000mg  (typically 1-2 extra strength tablets), 2-3 times per day as needed for fever or pain relief. Generally safe to use. Avoid if history of severe liver disease.  * Cough medication with dextromethorphan can suppress cough symptoms. Suppressing a cough is challenging and some believe it may be better to allow your body to cough so that you can get rid of excess mucus or infection.  * Nasal symptoms: saline rinse or mist 1-2 per day, antihistamine with or without decongestant (e.g. loratadine + pseudoephedrine or fexofenadine + pseudoephedrine).    Click on this link for what to do if you have been diagnosed with COVID-19. This link also includes a video on COVID-19 narrated by a [facility] primary care doctor: [email or web address]    You may be able to be part of a clinical study that [facility] is doing on COVID-19. If you want more information about how to participate in a clinical study, please go to [email or web address]    For more information visit  * CDC COVID-19: http://[email or web address]/  * [facility] COVID-19: HolyTattoo.[email or web address] or call our hotline at 3312835863  [facility] Referral: For additional assistance or to find a physician please contact Physician Referral Service at 800-Ely-MD1 [phone]).  ----------------------------------------------------  POSSIBLE COVID   AFTERCARE INSTRUCTIONS  ----------------------------------------------------     Patient aware a negative test may be due to pre-symptomatic status or a false neg  If the patient experiences symptoms, they should be retested and self-isolate as per current CDC guidelines.  Strict ER precautions discussed.  Patient agrees and verbalizes understanding.     STAY HOME IF YOU ARE SICK  -advise self isolate x 10-14 days  -rest  -fluids  -tyl/ibuprofen as needed for pain/fever     SEEK EMERGENCY MEDICAL CARE IF YOU ARE SERIOUSLY SICK WITH:  -Difficulty breathing  -can???t keep fluids down  -severe dehydration  -confusion  -chest pain  -weakness  -abd pain, n/v  -other serious symptoms     HELP PROTECT THE COMMUNITY, TAKE PRECAUTIONS  -Hand hygiene  -distancing  -wear a mask if you do need to be around other people     Patient agrees with plan and verbalized understanding  Patient is being discharged with suspected, or known, Covid-19.  Patient does not currently require respiratory support or supplemental oxygen.  Patient does not meet 911 or admission criteria.  I have informed the patient of my suspected diagnosis.  Provided return precautions, and recommended self isolation.  I also told the patient to contact their primary care physician with further questions.  Patient indicates understanding of the serious, and potentially lethal nature of the disease, and that the current situation dictates further home observation and to go to the nearest ER or call 911 if becomes severely short of breath or experiences other potentially life threatening symptoms

## 2020-11-06 ENCOUNTER — Telehealth: Payer: BLUE CROSS/BLUE SHIELD

## 2020-11-06 DIAGNOSIS — U071 COVID-19: Secondary | ICD-10-CM

## 2020-11-06 LAB — COVID-19 and Influenza A/B PCR: INFLUENZA A PCR: NOT DETECTED

## 2020-11-06 NOTE — Addendum Note
Addended by: Tilman Neat on: 11/06/2020 09:20 AM     Modules accepted: Orders

## 2020-11-06 NOTE — Telephone Encounter
Spoke with patient, informed of + covid result.  He was already aware.   Reveiwed cdc guidelines, need to isolate, need to inform contacts and encouraged continued precautions.  ER precautions also discussed.  All questions answered.      Aware being referred for monoclonal antibodies (crohn's, azathioprine), this was discussed.  He had questions regarding living with a [place] resident (currently she is asymptomatic and in contact with [facility] inf disease).  Advise they follow the advice of the [facility] protocol/infection dx.    PH completed and sent.

## 2020-12-28 DIAGNOSIS — F419 Anxiety disorder, unspecified: Secondary | ICD-10-CM | POA: Diagnosis not present

## 2020-12-28 DIAGNOSIS — K219 Gastro-esophageal reflux disease without esophagitis: Secondary | ICD-10-CM | POA: Diagnosis not present

## 2020-12-31 ENCOUNTER — Other Ambulatory Visit: Payer: Self-pay | Admitting: Internal Medicine

## 2021-01-30 DIAGNOSIS — R059 Cough, unspecified: Secondary | ICD-10-CM | POA: Diagnosis not present

## 2021-01-30 DIAGNOSIS — M791 Myalgia, unspecified site: Secondary | ICD-10-CM | POA: Diagnosis not present

## 2021-01-30 DIAGNOSIS — J029 Acute pharyngitis, unspecified: Secondary | ICD-10-CM | POA: Diagnosis not present

## 2021-01-30 DIAGNOSIS — Z20822 Contact with and (suspected) exposure to covid-19: Secondary | ICD-10-CM | POA: Diagnosis not present

## 2021-03-24 ENCOUNTER — Telehealth: Payer: Self-pay | Admitting: Internal Medicine

## 2021-03-24 NOTE — Telephone Encounter (Signed)
Inbound call from patient requesting an additional refill for Imuran medication be sent to [facility] in chart to have enough until he comes in for his appt on 04/17/21.  Please advise.

## 2021-03-27 NOTE — Telephone Encounter (Signed)
Dr Hilarie Fredrickson, please advise... Pt requesting imuran refills. Has appt 04/17/21, however, has not had labs since 03/2019. Was advised he needed labs by 10/2020.

## 2021-03-28 MED ORDER — AZATHIOPRINE 50 MG PO TABS: 150.0000 mg | ORAL_TABLET | Freq: Every day | ORAL | 0 refills | Status: DC

## 2021-03-28 NOTE — Telephone Encounter (Signed)
Can refill until this appt date and we will check labs on day of appt Thanks JMP

## 2021-03-28 NOTE — Telephone Encounter (Signed)
Rx sent until visit 04/17/21

## 2021-04-01 ENCOUNTER — Ambulatory Visit: Payer: BLUE CROSS/BLUE SHIELD

## 2021-04-01 DIAGNOSIS — R6889 Other general symptoms and signs: Secondary | ICD-10-CM

## 2021-04-01 DIAGNOSIS — Z20822 Contact with and (suspected) exposure to covid-19: Secondary | ICD-10-CM | POA: Diagnosis not present

## 2021-04-01 MED ORDER — OSELTAMIVIR PHOSPHATE 75 MG PO CAPS
75 mg | ORAL_CAPSULE | Freq: Two times a day (BID) | ORAL | 0 refills | Status: AC
Start: 2021-04-01 — End: ?

## 2021-04-01 NOTE — Progress Notes
SUBJECTIVE:  Chief Complaint   Patient presents with   ? Nasal Congestion   ? Generalized Body Aches     The patient presents with gen body aches fatigue nasal congestion nausea headache last 1 day    He has got only J and J vaccine  No fever or chills no cough dyspnea    Has only drunk water last 36 hours feels weak    Review of Systems   Constitutional: Positive for malaise/fatigue. Negative for chills and fever.   HENT: Positive for congestion. Negative for sore throat.    Eyes: Negative.    Respiratory: Negative.    Cardiovascular: Negative.    Gastrointestinal: Positive for nausea.   Genitourinary: Negative.    Musculoskeletal: Negative for myalgias.   Skin: Negative.    Neurological: Positive for headaches. Negative for dizziness.           Current Outpatient Medications   Medication Sig   ? ALPRAZolam 0.5 mg tablet Take 0.5 mg by mouth.   ? azaTHIOprine 50 mg tablet Take 50 mg by mouth three (3) times daily.   ? dicyclomine 10 mg capsule dicyclomine 10 mg capsule   TK 1 C PO Q 6 H PRF CRAMPING   ? ondansetron ODT 4 mg disintegrating tablet    ? pantoprazole 40 mg DR tablet Take 40 mg by mouth once.     No current facility-administered medications for this visit.     No Known Allergies        OBJECTIVE:  BP 124/81  ~ Pulse (!) 112  ~ Temp 36.7 ?C (98.1 ?F)  ~ Resp 16  ~ SpO2 97%   Gen: well ill looking , no acute distress  HEENT: NC/AT, PERRL, EOMI, Ear: no fluid, no bulging TM or erythema, oral moist, no exudate, neck supple  Lungs: : Chest is clear, no wheezing or rales. Normal symmetric air entry throughout both lung Posey. No chest wall deformities or tenderness.  Heart: S1 and S2 normal, no murmurs, clicks, gallops or rubs. Regular rate and rhythm.   Abdomen- soft non tender BS+  Skin- dry        ASSESSMENT:    1. Flu-like symptoms    - Influenza A/B RSV PCR, Respiratory Upper; Future  - oseltamivir (TAMIFLU) 75 mg capsule; Take 1 capsule (75 mg total) by mouth two (2) times daily for 5 days.  Dispense: 10 capsule; Refill: 0    2. Suspected COVID-19 virus infection    - COVID-19  PCR/TMA, Nasopharyngeal; Future      PLAN:  Rest, take medication as advised  Take plenty of fluids,warm salt water gargle, vit C 1000 mg for 2 weeks  Take Tylenol for body aches and pain and fever.  Increase fluid intake vitamin-C on a daily basis.  Results in 24 hours.  Self isolation till test results.  The above plan of care, diagnosis, orders, and follow-up were discussed with the patient.  The patient had all questions answered satisfactorily and understands this recommended plan of care.  See AVS for additional information and counseling materials provided to the patient.      Flem Enderle Z. Samara Snide, MD   [facility]

## 2021-04-01 NOTE — Addendum Note
Addended by: Valinda Party on: 04/01/2021 01:24 PM     Modules accepted: Orders

## 2021-04-01 NOTE — Patient Instructions
Rest, take medication as advised  Take plenty of fluids,warm salt water gargle, vit C 1000 mg for 2 weeks  Take Tylenol for body aches and pain and fever.  Increase fluid intake vitamin-C on a daily basis.  Results in 24 hours.  Self isolation till test results.

## 2021-04-02 LAB — COVID-19 PCR/TMA: COVID-19 PCR/TMA: NOT DETECTED

## 2021-04-02 LAB — Influenza A B RSV PCR: RSV PCR: NOT DETECTED

## 2021-04-11 ENCOUNTER — Other Ambulatory Visit: Payer: Self-pay

## 2021-04-17 ENCOUNTER — Other Ambulatory Visit (INDEPENDENT_AMBULATORY_CARE_PROVIDER_SITE_OTHER): Payer: BC Managed Care – PPO

## 2021-04-17 ENCOUNTER — Ambulatory Visit (INDEPENDENT_AMBULATORY_CARE_PROVIDER_SITE_OTHER): Payer: BC Managed Care – PPO | Admitting: Nurse Practitioner

## 2021-04-17 ENCOUNTER — Encounter: Payer: Self-pay | Admitting: Nurse Practitioner

## 2021-04-17 VITALS — BP 100/80 | HR 77 | Ht 68.0 in | Wt 204.0 lb

## 2021-04-17 DIAGNOSIS — K50019 Crohn's disease of small intestine with unspecified complications: Secondary | ICD-10-CM | POA: Diagnosis not present

## 2021-04-17 DIAGNOSIS — K219 Gastro-esophageal reflux disease without esophagitis: Secondary | ICD-10-CM | POA: Diagnosis not present

## 2021-04-17 LAB — COMPREHENSIVE METABOLIC PANEL
ALT: 32 U/L (ref 0–53)
AST: 28 U/L (ref 0–37)
Albumin: 4.5 g/dL (ref 3.5–5.2)
Alkaline Phosphatase: 65 U/L (ref 39–117)
BUN: 8 mg/dL (ref 6–23)
CO2: 29 mEq/L (ref 19–32)
Calcium: 9.3 mg/dL (ref 8.4–10.5)
Chloride: 104 mEq/L (ref 96–112)
Creatinine, Ser: 0.94 mg/dL (ref 0.40–1.50)
GFR: 108.32 mL/min (ref >60.00)
Glucose, Bld: 98 mg/dL (ref 70–99)
Potassium: 4.6 mEq/L (ref 3.5–5.1)
Sodium: 138 mEq/L (ref 135–145)
Total Bilirubin: 0.6 mg/dL (ref 0.2–1.2)
Total Protein: 7 g/dL (ref 6.0–8.3)

## 2021-04-17 LAB — CBC WITH DIFFERENTIAL/PLATELET
Basophils Absolute: 0 10*3/uL (ref 0.0–0.1)
Basophils Relative: 0.6 % (ref 0.0–3.0)
Eosinophils Absolute: 0.1 10*3/uL (ref 0.0–0.7)
Eosinophils Relative: 1.2 % (ref 0.0–5.0)
HCT: 44.7 % (ref 39.0–52.0)
Hemoglobin: 15.7 g/dL (ref 13.0–17.0)
Lymphocytes Relative: 24.8 % (ref 12.0–46.0)
Lymphs Abs: 1.1 10*3/uL (ref 0.7–4.0)
MCHC: 35.2 g/dL (ref 30.0–36.0)
MCV: 93.9 fl (ref 78.0–100.0)
Monocytes Absolute: 0.3 10*3/uL (ref 0.1–1.0)
Monocytes Relative: 8 % (ref 3.0–12.0)
Neutro Abs: 2.9 10*3/uL (ref 1.4–7.7)
Neutrophils Relative %: 65.4 % (ref 43.0–77.0)
Platelets: 244 10*3/uL (ref 150.0–400.0)
RBC: 4.76 Mil/uL (ref 4.22–5.81)
RDW: 13.6 % (ref 11.5–15.5)
WBC: 4.4 10*3/uL (ref 4.0–10.5)

## 2021-04-17 LAB — C-REACTIVE PROTEIN: CRP: <1 mg/dL (ref 0.5–20.0)

## 2021-04-17 MED ORDER — PANTOPRAZOLE SODIUM 40 MG PO TBEC: 40.0000 mg | DELAYED_RELEASE_TABLET | Freq: Every day | ORAL | 1 refills | Status: DC

## 2021-04-17 MED ORDER — FAMOTIDINE 20 MG PO TABS: 20.0000 mg | ORAL_TABLET | Freq: Every day | ORAL | 1 refills | Status: DC

## 2021-04-17 MED ORDER — AZATHIOPRINE 50 MG PO TABS: 150.0000 mg | ORAL_TABLET | Freq: Every day | ORAL | 0 refills | Status: DC

## 2021-04-17 NOTE — Progress Notes (Signed)
04/17/2021 Jacob [name] [id number] [birth year]    Chief Complaint:  Crohn' disease follow up   History of Present Illness: Jacob [name] is a 30 year old male with a past medical history of  anxiety, GERD, H. Pylori 2017 treated with Pylera and PPI, IBS, hyperplastic colon polyp and Crohn's ileitis initially diagnosed in 2015.  He presents to our office today for his required Crohn's disease follow-up.  He was last seen in the office by Dr. Hilarie Fredrickson on 07/09/2018 and his most recent colonoscopy was completed on 11/20/2018 which showed his Crohn's ileitis was in remission. He remains on Azathioprine 127m QD. He complains of having central abdominal pain which occurs daily upon awakening in the morning and lasts a few hours and goes away after he eats breakfast and passes a bowl movement. He is passing a normal formed brown stool 2 to 3 times daily. No rectal bleeding or black stools.  He feels sluggish in the morning and unmotivated at times.  He has heartburn in the mornings as well. No dysphagia.  He is taking Pantoprazole 40 mg every morning.  Infrequently uses Dicyclomine.  He feels his GI symptoms are interfering with his quality of living.  [place] he went to the urgent care and he was diagnosed with the flu, COVID testing was negative.  No fever, sweats or chills.  No weight loss.  He infrequently takes Ibuprofen for aches and pains. Alcohol intake is limited, often goes weeks without drinking any alcohol. No other complaints at this time.   CBC Latest Ref Rng & Units 09/22/2018 07/09/2018 11/06/2017  WBC 4.0 - 10.5 K/uL 2.5(L) 3.6(L) 3.0(L)  Hemoglobin 13.0 - 17.0 g/dL 16.5 16.7 16.4  Hematocrit 39.0 - 52.0 % 47.4 47.6 47.8  Platelets 150.0 - 400.0 K/uL 203.0 222.0 265.0    CMP Latest Ref Rng & Units 03/23/2019 09/22/2018 07/09/2018  Glucose 65 - 99 mg/dL 90 94 86  BUN 6 - 20 mg/dL 10 10 9   Creatinine 0.76 - 1.27 mg/dL 0.93 1.03 1.05  Sodium  134 - 144 mmol/L 138 139 131(L)  Potassium 3.5 - 5.2 mmol/L 4.4 4.3 4.0  Chloride 96 - 106 mmol/L 100 105 97  CO2 20 - 29 mmol/L 22 29 25   Calcium 8.7 - 10.2 mg/dL 9.4 9.5 9.6  Total Protein 6.0 - 8.5 g/dL 6.9 7.2 7.9  Total Bilirubin 0.0 - 1.2 mg/dL 0.5 0.7 0.6  Alkaline Phos 39 - 117 IU/L 71 65 76  AST 0 - 40 IU/L 16 15 54(H)  ALT 0 - 44 IU/L 15 16 56(H)    Colonoscopy 11/20/2018 by Dr. PHilarie Fredrickson - The examined portion of the ileum was normal. Ileal Crohn's disease in remission. - The entire examined colon is normal on direct and retroflexion views. - No specimens collected. -Recall colonoscopy not yet determined  Colonoscopy 11/17/2015: 1. Mild ileitis in the very distal terminal ileum with scarring consistent with prior active Crohn's disease; multiple biopsies 2. The colonic mucosa appeared normal throughout the entire examined colon Biopsy results: Ileal biopsy showed mild active chronic ileitis, no dysplasia or malignancy  EGD 1 03/2016: 1. The mucosa of the esophagus appeared normal 2. The mucosa of the stomach appeared normal; multiple biopsies 3. The duodenal mucosa showed no abnormalities in the bulb and 2nd part of the duodenum 1. Surgical [P], gastric antrum and gastric body biopsies showed chronic active gastritis with Helicobacter pylori, no intestinal  metaplasia, dysplasia or malignancy  Small bowel capsule endoscopy 07/02/2014: 1.  Complete study, fair prep 2.  Active inflammation consistent with Crohn's starting at 4 hours 38 minutes and extending to cecum.  Colonoscopy 12/04/2013: 1.  Moderate erosions and ileitis was found in the terminal ileum; multiple biopsies the area were performed using cold forceps; suspected Crohn's disease 2.  The colonic mucosa appeared normal throughout the entire examined colon; multiple random biopsies were performed 3.  A 3 mm hyperplastic polyp was removed from the sigmoid colon 1. Surgical [P], terminal ileum, biopsy - ACTIVE AND  ULCERATIVE ILEITIS. - THERE IS NO EVIDENCE OF DYSPLASIA OR MALIGNANCY. - SEE COMMENT. 2. Surgical [P], right colon - BENIGN COLONIC MUCOSA. - NO SIGNIFICANT INFLAMMATION OR OTHER ABNORMALITIES IDENTIFIED. 3. Surgical [P], sigmoid, polyp - HYPERPLASTIC POLYP. - THERE IS NO EVIDENCE OF MALIGNANCY.  04/07/2016 CT ABDOMEN PELVIS W CONTRAST ENTEROGRAPHY  04/07/2016:  CLINICAL INDICATION: Crohn's disease, abdominal pain   IMAGING MEDICATION:iohexol (OMNIPAQUE) 300 mg/mL injection 100 mL    FINDINGS:  The lung bases are clear. The visualized heart is normal in size.  Distention of proximal and mid small bowel is adequate.    Distention of distal small bowel loops is adequate.    There is no abnormal small bowel   The terminal ileum is  unremarkable. The appendix is normal. There are no strictures or  fistulas.  There is no obstruction to the antegrade passage of contrast to  the colon.  There is no perienteric stranding.  There is no engorgement of the vasa recta.  The celiac, superior mesenteric and inferior mesenteric arteries  are patent without significant stenosis.   ABDOMEN:  LOWER CHEST: The lung bases are unremarkable. There is no pleural  fluid. The visualized heart and thoracic aorta are unremarkable.  LIVER: The liver is normal, no intrahepatic biliary dilatation or  focal hepatic lesions.  VEINS: The hepatic, portal, splenic, and superior mesenteric veins  are patent.  BILE DUCTS: There is no biliary dilatation.  GALLBLADDER: The gallbladder is unremarkable. No gallstones or  wall thickening.   SPLEEN: The spleen is normal in size and appearance.  PANCREAS: The pancreas is normal with no evidence for mass or  ductal dilatation.  ADRENALS: The adrenal glands are normal, without nodularity.  KIDNEYS: Normal in appearance.  AORTA: The aorta is normal.  MESENTERIC ARTERIES: The central visceral vessels are patent.  MESENTERIC LYMPH NODES: No enlarged mesenteric  lymph nodes.  PERITONEUM: No ascites or free air, no fluid collection.  RETROPERITONEUM: There are no enlarged retroperitoneal lymph  nodes.  BOWEL: There is wall thickening with increased mucosal enhancement  and luminal narrowing in the terminal ileum involving a segment of  approximately 12 cm. There is hypertrophy of the mesenteric fat  about the terminal ileum and there is an enlarged mesenteric lymph  node in this region measuring 16 mm (image [id number]). There is a  second skip lesion approximately 8 cm proximal to the terminal  ileal disease. Short segment of wall narrowing. There is no  significant proximal dilatation. Patient's had an appendectomy.  There is no fissure or or extraluminal.   PELVIS:  PELVIC LYMPH NODES: There are no enlarged pelvic sidewall or  inguinal lymph nodes.  PELVIC PERITONEUM: No ascites or free air, no fluid collection.  URETERS: The distal ureters are nondilated.  URINARY BLADDER: Normal   PELVIC BOWEL: Large and small bowel loops are normal in caliber  and appearance.  ABDOMINAL WALL: No masses or  hernias.  BONES: Within normal limits. No suspicious osseous lesions.   IMPRESSION:  Terminal ileitis with findings as described above.  There is a mildly enlarged mesenteric lymph node in the right  lower quadrant.     Current Medications, Allergies, Past Medical History, Past Surgical History, Family History and Social History were reviewed in [employer] record.  Review of Systems:   Constitutional: Negative for fever, sweats, chills or weight loss.  Respiratory: Negative for shortness of breath.   Cardiovascular: Negative for chest pain, palpitations and leg swelling.  Gastrointestinal: See HPI.  Musculoskeletal: Negative for back pain or muscle aches.  Neurological: Negative for dizziness, headaches or paresthesias.    Physical Exam: BP 100/80   Pulse 77   Ht 5' 8"  (1.727 m)   Wt 204 lb (92.5 kg)   BMI 31.02 kg/m    General: Well developed 30 year old male in no acute distress. Head: Normocephalic and atraumatic. Eyes: No scleral icterus. Conjunctiva pink . Ears: Normal auditory acuity. Mouth: Dentition intact. No ulcers or lesions.  Lungs: Clear throughout to auscultation. Heart: Regular rate and rhythm, no murmur. Abdomen: Soft, nondistended. Mild tenderness above the umbilicus and to the right mid abdomen without rebound or guarding.  No masses or hepatomegaly. Normal bowel sounds x 4 quadrants.  Rectal: Deferred.  Musculoskeletal: Symmetrical with no gross deformities. Extremities: No edema. Neurological: Alert oriented x 4. No focal deficits.  Psychological: Alert and cooperative. Normal mood and affect  Assessment and Recommendations:  87.  30 year old male with Crohn's ileitis on Azathioprine 125m QD with daily central abdominal pain -CBC, CMP, CRP, Thiopurine Metabolites (6 TGN and 6 MMP) and Quantiferon gold level  -Fecal calprotectin level  -Continue Azathioprine 1562mQD -Small bowel MRI w/wo contrast  -No NSAIDs -Flu vaccination Fall 2022 -Follow-up in office with Dr. PyHilarie Fredricksonn 3 to 4 months  2. IBS -Dicyclomine 2053mo TID PRN, advised patient to take Dicyclomine 25m56m in the am   3. GERD. History of H. Pylori gastritis 2017. Daily heartburn in the am.  -Continue Pantoprazole 40 mg every morning -Add Famotidine 20 mg nightly -GERD diet discussed   Further follow-up to be determined after the above evaluation completed  Today's encounter included 25 minutes for precharting, chart review, history/exam, face to face time including counseling, follow-up and documentation.

## 2021-04-17 NOTE — Patient Instructions (Addendum)
If you are age 31 or younger, your body mass index should be between 19-25. Your Body mass index is 31.02 kg/m. If this is out of the aformentioned range listed, please consider follow up with your Primary Care Provider.   LABS:  Lab work has been ordered for you today. Our lab is located in the basement. Press "B" on the elevator. The lab is located at the first door on the left as you exit the elevator.  HEALTHCARE LAWS AND MY CHART RESULTS: Due to recent changes in healthcare laws, you may see the results of your imaging and laboratory studies on MyChart before your provider has had a chance to review them.   We understand that in some cases there may be results that are confusing or concerning to you. Not all laboratory results come back in the same time frame and the provider may be waiting for multiple results in order to interpret others.  Please give [place] 48 hours in order for your provider to thoroughly review all the results before contacting the office for clarification of your results.   IMAGING:  . You will be contacted by [facility] (Your caller ID will indicate phone # [phone]) in the next 2 days to schedule your MRI. If you have not heard from them within 2 business days, please call [facility] at [phone] to follow up on the status of your appointment.    Your medications have been refilled.  It was great seeing you today! Thank you for entrusting me with your care and choosing [facility].  Noralyn Pick, CRNP

## 2021-04-17 NOTE — Progress Notes (Signed)
RADIOLOGY SCHEDULING REQUEST SENT TO: [facility] Scheduling via secure staff message.

## 2021-04-17 NOTE — Progress Notes (Signed)
Addendum: Reviewed and agree with assessment and management plan. Kewanna Kasprzak M, MD  

## 2021-04-18 ENCOUNTER — Other Ambulatory Visit: Payer: BC Managed Care – PPO

## 2021-04-18 ENCOUNTER — Telehealth: Payer: Self-pay | Admitting: Nurse Practitioner

## 2021-04-18 DIAGNOSIS — K219 Gastro-esophageal reflux disease without esophagitis: Secondary | ICD-10-CM | POA: Diagnosis not present

## 2021-04-18 DIAGNOSIS — K50019 Crohn's disease of small intestine with unspecified complications: Secondary | ICD-10-CM

## 2021-04-18 NOTE — Telephone Encounter (Signed)
Jacob [name], ok to refill Dicyclomine 35m one tab po Q 8hrs prn for abdominal pain and spasms and Ondansetron 436mpo Q 8 hrs PRN as previously prescribed x 1 refill. Thx

## 2021-04-19 MED ORDER — DICYCLOMINE HCL 20 MG PO TABS: 20.0000 mg | ORAL_TABLET | Freq: Three times a day (TID) | ORAL | 1 refills | Status: DC | PRN

## 2021-04-19 MED ORDER — ONDANSETRON 4 MG PO TBDP: 4.0000 mg | ORAL_TABLET | Freq: Three times a day (TID) | ORAL | 1 refills | Status: DC | PRN

## 2021-04-19 NOTE — Telephone Encounter (Signed)
Both have been sent. 

## 2021-04-22 LAB — THIOPURINE METABOLITES
6 MMP(6-Methylmercaptopurine): 2656 pmol/8x10(8)RBC (ref <5700)
6 TG(6-Thioguanine): 180 pmol/8x10(8)RBC — ABNORMAL LOW (ref 235–400)

## 2021-04-22 LAB — CALPROTECTIN, FECAL: Calprotectin, Fecal: 28 ug/g (ref 0–120)

## 2021-04-27 NOTE — Progress Notes (Signed)
Pt.notified

## 2021-05-05 NOTE — Progress Notes (Signed)
LM for patient

## 2021-05-09 ENCOUNTER — Other Ambulatory Visit: Payer: Self-pay | Admitting: Nurse Practitioner

## 2021-05-11 ENCOUNTER — Other Ambulatory Visit: Payer: Self-pay | Admitting: Nurse Practitioner

## 2021-05-12 NOTE — Progress Notes (Signed)
LM for patient

## 2021-05-12 NOTE — Progress Notes (Signed)
Notified patient of message and results , patient expressed understanding and agreement, no further questions at this time. Patient states doing well and will call us if that changes before his 7/29 appointment.

## 2021-05-24 ENCOUNTER — Other Ambulatory Visit: Payer: Self-pay | Admitting: Nurse Practitioner

## 2021-06-05 DIAGNOSIS — F419 Anxiety disorder, unspecified: Secondary | ICD-10-CM | POA: Diagnosis not present

## 2021-06-05 DIAGNOSIS — K5090 Crohn's disease, unspecified, without complications: Secondary | ICD-10-CM | POA: Diagnosis not present

## 2021-06-09 ENCOUNTER — Ambulatory Visit (HOSPITAL_COMMUNITY): Payer: BC Managed Care – PPO

## 2021-06-09 ENCOUNTER — Ambulatory Visit (HOSPITAL_COMMUNITY): Admission: RE | Admit: 2021-06-09 | Payer: BC Managed Care – PPO | Source: Ambulatory Visit

## 2021-06-14 ENCOUNTER — Ambulatory Visit (HOSPITAL_COMMUNITY): Admission: RE | Admit: 2021-06-14 | Payer: BC Managed Care – PPO | Source: Ambulatory Visit

## 2021-06-14 ENCOUNTER — Inpatient Hospital Stay (HOSPITAL_COMMUNITY): Admission: RE | Admit: 2021-06-14 | Payer: BC Managed Care – PPO | Source: Ambulatory Visit

## 2021-06-14 ENCOUNTER — Other Ambulatory Visit (HOSPITAL_COMMUNITY): Payer: BC Managed Care – PPO

## 2021-07-04 ENCOUNTER — Telehealth: Payer: Self-pay | Admitting: Internal Medicine

## 2021-07-04 NOTE — Telephone Encounter (Signed)
Yes MR enterography abd/pelvis

## 2021-07-04 NOTE — Telephone Encounter (Signed)
Dr. Hilarie Fredrickson see note below from pt. Can we order and MRI in [place]? I have not called him back yet.

## 2021-07-05 NOTE — Telephone Encounter (Signed)
Left message for to to call back.

## 2021-07-06 ENCOUNTER — Telehealth: Payer: BLUE CROSS/BLUE SHIELD

## 2021-07-06 ENCOUNTER — Ambulatory Visit: Payer: BLUE CROSS/BLUE SHIELD

## 2021-07-06 DIAGNOSIS — Z139 Encounter for screening, unspecified: Secondary | ICD-10-CM

## 2021-07-06 DIAGNOSIS — J069 Acute upper respiratory infection, unspecified: Secondary | ICD-10-CM

## 2021-07-06 DIAGNOSIS — H6690 Otitis media, unspecified, unspecified ear: Secondary | ICD-10-CM

## 2021-07-06 MED ORDER — BENZONATATE 100 MG PO CAPS
100 mg | ORAL_CAPSULE | Freq: Three times a day (TID) | ORAL | 1 refills | Status: AC | PRN
Start: 2021-07-06 — End: ?

## 2021-07-06 MED ORDER — GUAIFENESIN-CODEINE 100-10 MG/5ML PO SOLN
5 mL | Freq: Three times a day (TID) | ORAL | 0 refills | Status: AC | PRN
Start: 2021-07-06 — End: 2021-07-07

## 2021-07-06 MED ORDER — BENZONATATE 100 MG PO CAPS
100 mg | ORAL_CAPSULE | Freq: Three times a day (TID) | ORAL | 1 refills | Status: AC | PRN
Start: 2021-07-06 — End: 2021-07-07

## 2021-07-06 MED ORDER — GUAIFENESIN-CODEINE 100-10 MG/5ML PO SOLN
5 mL | Freq: Three times a day (TID) | ORAL | 0 refills | Status: AC | PRN
Start: 2021-07-06 — End: ?

## 2021-07-06 MED ORDER — DEXTROMETHORPHAN-GUAIFENESIN 10-100 MG/5ML PO SYRP
5 mL | Freq: Two times a day (BID) | ORAL | 0 refills | Status: AC
Start: 2021-07-06 — End: 2021-07-07

## 2021-07-06 MED ORDER — AMOXICILLIN 500 MG PO CAPS
500 mg | ORAL_CAPSULE | Freq: Two times a day (BID) | ORAL | 0 refills | Status: AC
Start: 2021-07-06 — End: ?

## 2021-07-06 NOTE — Telephone Encounter
Message to Practice/Provider      Message: [facility] inquiring if QuaiFENesin- Codeine may be sent electronically as they are unable to accept the prescription from the patient as it was not written on a tamper resistant pad. Phone: [phone]  Fax: [phone]    Return call is not being requested by the patient or caller.    Patient or caller has been notified of the 24-48 hour processing turnaround time if applicable.

## 2021-07-06 NOTE — Progress Notes
Immediate Care Note      Date of Service: 07/06/2021    Subjective:     HPI: Anthony Salazar is a 30 y.o. male here for sore throat, intermittent HA, congestion, earache past 4 days.    No sick contacts. Just flew in from [place].   COVID vaxed, not boosted. 1.5 years ago was last shot.  Some coughing, but not too bad. No SOB    Past Medical History:  He has no past medical history on file.    Past Surgical History:  He has no past surgical history on file.    Medication and Supplements:  Outpatient Medications Prior to Visit   Medication Sig   ? ALPRAZolam 0.5 mg tablet Take 0.5 mg by mouth.   ? azaTHIOprine 50 mg tablet Take 50 mg by mouth three (3) times daily.   ? dicyclomine 10 mg capsule dicyclomine 10 mg capsule   TK 1 C PO Q 6 H PRF CRAMPING   ? ondansetron ODT 4 mg disintegrating tablet    ? pantoprazole 40 mg DR tablet Take 40 mg by mouth once.     No facility-administered medications prior to visit.       Allergies:  No Known Allergies    Social History:  He reports that he has never smoked. He has never used smokeless tobacco. He reports current alcohol use of about 3.0 oz of alcohol per week. No history on file for drug use.    Review of Symptoms:  ROS Negative except for the above.    Objective:     Physical Exam  BP 108/60  ~ Pulse 80  ~ Temp 36.9 ?C (98.4 ?F)  ~ SpO2 100%     General: alert, well appearing, and in no distress  Head: Atraumatic, normocephalic  Ears: hearing grossly normal  Eyes: EOM L ear TM erythematous and tight, no pain on pinna movement  Mouth/Throat: mucous membranes moist  Neck: supple, trachea appears midline  Skin: normal coloration and turgor, no rashes, no suspicious skin lesions noted.  Neuro: alert, oriented, normal speech, no focal findings or movement disorder noted  Resp: CTAB  Card: RRR    Assessment:     1. Viral URI with cough    2. Encounter for screening, unspecified    3. Acute otitis media, unspecified otitis media type      Recommended benzonatate, but pt prefer guaifen/codeine instead of dextro / benzonatate. Risks benefits discussed. Will attempt paper script.  Amoxicillin for AOM    Follow up with PCP  Return/ED precautions given    Alinda Sierras, MD, MPH

## 2021-07-06 NOTE — Addendum Note
Addended by: Rene Paci on: 07/06/2021 11:01 AM     Modules accepted: Orders

## 2021-07-06 NOTE — Addendum Note
Addended by: Rene Paci on: 07/06/2021 11:04 AM     Modules accepted: Orders

## 2021-07-06 NOTE — Addendum Note
Addended by: Rene Paci on: 07/06/2021 11:34 AM     Modules accepted: Orders

## 2021-07-06 NOTE — Addendum Note
Addended by: Florian Buff on: 07/06/2021 10:38 AM     Modules accepted: Orders

## 2021-07-07 LAB — COVID-19 PCR/TMA: COVID-19 PCR/TMA: NOT DETECTED

## 2021-07-07 LAB — Influenza A B RSV PCR: RSV PCR: NOT DETECTED

## 2021-07-07 NOTE — Telephone Encounter (Signed)
Pt was unable to reach by phone multiple times. A letter was sent to the pt via the mail.

## 2021-07-19 DIAGNOSIS — F419 Anxiety disorder, unspecified: Secondary | ICD-10-CM | POA: Diagnosis not present

## 2021-09-07 ENCOUNTER — Telehealth: Payer: Self-pay | Admitting: Nurse Practitioner

## 2021-09-07 NOTE — Telephone Encounter (Signed)
Inbound call from patient. Asking if the MRI order could be sent directly to him via mychart or what is easier so he can shop around and have the MRI done before appt 11/22.

## 2021-09-11 NOTE — Telephone Encounter (Signed)
Will print order and have Colleen sign tomorrow. Patient does not have active MyChart so will need to mail it.

## 2021-09-12 NOTE — Telephone Encounter (Signed)
Order printed and mailed.

## 2021-10-03 ENCOUNTER — Ambulatory Visit (INDEPENDENT_AMBULATORY_CARE_PROVIDER_SITE_OTHER): Payer: BC Managed Care – PPO | Admitting: Internal Medicine

## 2021-10-03 ENCOUNTER — Encounter: Payer: Self-pay | Admitting: Internal Medicine

## 2021-10-03 VITALS — BP 102/70 | HR 70 | Ht 68.0 in | Wt 206.0 lb

## 2021-10-03 DIAGNOSIS — K5000 Crohn's disease of small intestine without complications: Secondary | ICD-10-CM | POA: Diagnosis not present

## 2021-10-03 DIAGNOSIS — K589 Irritable bowel syndrome without diarrhea: Secondary | ICD-10-CM

## 2021-10-03 DIAGNOSIS — K219 Gastro-esophageal reflux disease without esophagitis: Secondary | ICD-10-CM

## 2021-10-03 MED ORDER — PANTOPRAZOLE SODIUM 40 MG PO TBEC: 40.0000 mg | DELAYED_RELEASE_TABLET | Freq: Every day | ORAL | 1 refills | Status: DC

## 2021-10-03 MED ORDER — ONDANSETRON 4 MG PO TBDP: 4.0000 mg | ORAL_TABLET | Freq: Three times a day (TID) | ORAL | 1 refills | Status: DC | PRN

## 2021-10-03 MED ORDER — DICYCLOMINE HCL 20 MG PO TABS: ORAL_TABLET | ORAL | 1 refills | Status: DC

## 2021-10-03 NOTE — Progress Notes (Signed)
Subjective:    Patient ID: Jacob [name], male    DOB: 23-May-1991, 30 y.o.   MRN: [id number]  HPI rue tinnel is a 30 year old male with a history of Crohn's ileitis (diagnosis 2015), GERD, previously treated H. pylori, IBS who is here for follow-up.  He was last seen in June 2022 by Carl Best, NP.  He reports that he is actually been doing well.  He has been adherent with azathioprine 150 mg daily.  He is pantoprazole 40 mg in the day.  He has a general abdominal discomfort when he wakes up which resolves either with eating or when he is up and about.  He has tried Bentyl 20 mg on schedule in the morning and he does feel like this helps.  He ran out of Pepcid which she had been taking at night but has not really been able to determine whether this was helping or not.  He is not having much breakthrough heartburn or indigestion.  No diarrhea.  Black stool on a couple of occasions but not frequently.  He does take Pepto-Bismol occasionally.  No dysphagia or odynophagia.  The MRI was priced and found to be about $6000.  MR enterography was recommended but he delayed this due to the expense.  He has a high deductible plan.   Review of Systems As per HPI, otherwise negative  Current Medications, Allergies, Past Medical History, Past Surgical History, Family History and Social History were reviewed in [employer] record.    Objective:   Physical Exam BP 102/70   Pulse 70   Ht 5' 8"  (1.727 m)   Wt 206 lb (93.4 kg)   SpO2 99%   BMI 31.32 kg/m  Gen: awake, alert, NAD CV: RRR, no mrg Pulm: CTA b/l Abd: soft, NT/ND, +BS throughout Ext: no c/c/e Neuro: nonfocal  Thiopurine metabolite panel 04/2021; 6-TG 180, 6-MMP 2656 Fecal calprotectin normal at 28  CBC    Component Value Date/Time   WBC 4.4 04/17/2021 0956   RBC 4.76 04/17/2021 0956   HGB 15.7 04/17/2021 0956   HCT 44.7 04/17/2021 0956   PLT 244.0 04/17/2021 0956   MCV 93.9 04/17/2021 0956    MCH 27.4 10/31/2012 0836   MCHC 35.2 04/17/2021 0956   RDW 13.6 04/17/2021 0956   LYMPHSABS 1.1 04/17/2021 0956   MONOABS 0.3 04/17/2021 0956   EOSABS 0.1 04/17/2021 0956   BASOSABS 0.0 04/17/2021 0956   CMP     Component Value Date/Time   NA 138 04/17/2021 0956   NA 138 03/23/2019 0000   K 4.6 04/17/2021 0956   CL 104 04/17/2021 0956   CO2 29 04/17/2021 0956   GLUCOSE 98 04/17/2021 0956   BUN 8 04/17/2021 0956   BUN 10 03/23/2019 0000   CREATININE 0.94 04/17/2021 0956   CALCIUM 9.3 04/17/2021 0956   PROT 7.0 04/17/2021 0956   PROT 6.9 03/23/2019 0000   ALBUMIN 4.5 04/17/2021 0956   ALBUMIN 4.8 03/23/2019 0000   AST 28 04/17/2021 0956   ALT 32 04/17/2021 0956   ALKPHOS 65 04/17/2021 0956   BILITOT 0.6 04/17/2021 0956   BILITOT 0.5 03/23/2019 0000   GFRNONAA 111 03/23/2019 0000   GFRAA 128 03/23/2019 0000          Assessment & Plan:  30 year old male with a history of Crohn's ileitis (diagnosis 2015), GERD, previously treated H. pylori, IBS who is here for follow-up.    Ileal Crohn's disease/diagnosis 2015 --given his general wellbeing,  normal fecal calprotectin and continued use of azathioprine I feel that his ileal disease is likely under good control.  We discussed assessment for mucosal disease activity either MR enterography versus video capsule endoscopy.  Video capsule endoscopy is a better more direct test given direct visualization and likely less expensive than MR enterography.  We decided on the following: --Continue azathioprine 150 mg daily --Video capsule endoscopy in early January --We can use fecal calprotectin again in the future as a surrogate for disease activity  2. IBS --his abdominal discomfort in the morning is likely more irritable in nature than inflammatory.  We discussed this.  Bentyl seems to help and certainly okay to continue 20 mg scheduled every morning.  He can use this up to 3 times daily as needed  3.  Nausea --intermittent, mild and  without vomiting.  Zofran is very effective.  Continue ondansetron 4 mg every 8 hours as needed for nausea  4.  GERD and dyspepsia --well-controlled with pantoprazole continue 40 mg daily.  We will stop famotidine for now but he can use this 20 mg in the evening if needed for breakthrough heartburn or indigestion  30 minutes total spent today including patient facing time, coordination of care, reviewing medical history/procedures/pertinent radiology studies, and documentation of the encounter.

## 2021-10-03 NOTE — Patient Instructions (Signed)
Continue Azathioprine as directed.   Continue Pantoprazole as directed.   We have sent the following medications to your pharmacy for you to pick up at your convenience: Zofran, Bentyl , Pantoprazole   You may use over the counter Pepcid 2m - Take 1 tablet by mouth at bedtime for breakthrough of acid reflux as needed.     CAPSULE ENDOSCOPY PATIENT INSTRUCTION STEMESGEN [id number]  11/11/21 Seven (7) days prior to capsule endoscopy stop taking iron supplements and carafate.  11/15/21 Two (2) days prior to capsule endoscopy stop taking aspirin or any arthritis drugs.  11/16/21 Day before capsule endoscopy purchase a 238 gram bottle of Miralax from the laxative section of your drug store, and a 32 oz. bottle of Gatorade (no red).    11/16/21 One (1) day prior to capsule endoscopy: Stop smoking. Eat a regular diet until 12:00 Noon. After 12:00 Noon take only the following: Black coffee  Jell-O (no fruit or red Jell-o) Water   Bouillon (chicken or beef) 7-Up   Cranberry Juice Tea   Kool-Aid Popsicle (not red) Sprite   Coke Ginger Ale  Pepsi Mountain Dew Gatorade At 6:00 pm the evening before your appointment, drink 7 capfuls (105 grams) of Miralax with 32 oz. Gatorade. Drink 8 oz every 15 minutes until gone. Nothing to eat or drink after midnight except medications with a sip of water.  11/17/21  Day of capsule endoscopy:  No medications for 2 hours prior to your test.  [facility] by 8:15am on: 11/17/21.   For any questions: [facility] [phone] ask to speak with one of the capsule endoscopy nurses.  YOU WILL NEED TO RETURN THE EQUIPMENT AT 4 PM ON THE DAY OF THE PROCEDURE.  PLEASE KEEP THIS IN MIND WHEN SCHEDULING.   The above instructions have been reviewed and explained to me by________________   Patient signature:_________________________________________      Date:________________  Small Bowel Capsule Endoscopy  What you should know: Small Bowel capsule endoscopy is a procedure that takes pictures of the inside of your small intestine (bowel).  Your small bowel connects to your stomach on one end, and your large bowel (colon) on the other.  A capsule endoscopy is done by swallowing a pill size camera.  The capsule moves through your stomach and into your small bowel, where pictures are taken.   You may need a small bowel capsule endoscopy if you have symptoms, such as blood in your stool, chronic stomach pain, and diarrhea.  The pictures may show if you have growths, swelling, and bleeding area in you small bowel.  A capsule endoscopy may also show if diseases such as Crohn's or celiac disease are causing your symptoms.  Having a small bowel capsule endoscopy may help you and your caregiver learn the cause of your symptoms.  Learning what is causing your symptoms allows you to receive needed treatment and prevent further problems. Risks: You may have stomach pain during your procedure.   The pictures taken by the capsule may not be clear.   The pictures may not show the cause of your symptoms.   You may need another endoscopy procedure.   The capsule may get trapped in your esophagus or intestines. You may need surgery or additional procedures to remove the capsule from your body.    Before your procedure: You will be instructed to stop certain prescription medications or over-  the -counter medications prior to the procedure.   The day before your scheduled appointment you will need to be on a restricted diet and will need to drink a bowel prep that will clean out your bowels.   The day of the procedure: You may drive yourself to the procedure.   You will need to plan on 2 trips to the office on the day of the procedure. Morning: Plan to be at the office about 45 minutes. The morning of the procedure a sensor belt and recorder will be placed on  you.  You will wear this for 8 hours.  (The sensor belt transfers pictures of your small bowel to the recorder.)   You will be given a pill-sized capsule endoscope to swallow.  Once you swallow the capsule it will travel through your body the same way food does, constantly taking pictures along the way.  The capsule takes 2-3 pictures a second.   Once you have left the office you may go about your normal day with a few exceptions: You may not go near a MRI machine or a radio or television towers; You need to avoid other patients having capsule endoscopy; You will be given a written diet to follow for the day.  Afternoon: You will need to be return to the office at your designated time. The sensors belt will be removed You will need to be at the office about 15 minutes.   If you are age 10 or younger, your body mass index should be between 19-25. Your Body mass index is 31.32 kg/m. If this is out of the aformentioned range listed, please consider follow up with your Primary Care Provider.   ________________________________________________________  The [facility] providers would like to encourage you to use [facility] to communicate with providers for non-urgent requests or questions.  Due to long hold times on the telephone, sending your provider a message by [facility] may be a faster and more efficient way to get a response.  Please allow 48 business hours for a response.  Please remember that this is for non-urgent requests.  _______________________________________________________  Thank you for choosing me and [facility].  Dr.Pyrtle

## 2021-11-23 DIAGNOSIS — U071 COVID-19: Secondary | ICD-10-CM | POA: Diagnosis not present

## 2021-11-23 DIAGNOSIS — F419 Anxiety disorder, unspecified: Secondary | ICD-10-CM | POA: Diagnosis not present

## 2021-12-11 DIAGNOSIS — U071 COVID-19: Secondary | ICD-10-CM | POA: Diagnosis not present

## 2021-12-11 DIAGNOSIS — F419 Anxiety disorder, unspecified: Secondary | ICD-10-CM | POA: Diagnosis not present

## 2022-01-02 ENCOUNTER — Other Ambulatory Visit: Payer: Self-pay

## 2022-01-02 ENCOUNTER — Telehealth: Payer: Self-pay | Admitting: Internal Medicine

## 2022-01-02 DIAGNOSIS — K5000 Crohn's disease of small intestine without complications: Secondary | ICD-10-CM

## 2022-01-02 NOTE — Telephone Encounter (Signed)
Pt calling wanting to reschedule the capsule endo he missed a while back. Wants this asap. Also want to see Dr. Hilarie Fredrickson next week. He reports he was on vacation in [place] and felt so bad, he had nausea and vomiting, diarrhea, couldn't eat, and stomach pain. He states he has not felt that bad in quite a while. He reports he is back to having "manageable symptoms now. He did not see any blood in his stool. Please advise.

## 2022-01-02 NOTE — Telephone Encounter (Signed)
Patient called and stated he was not doing well at all.  He wants to have the capsule endoscopy that he missed last month and see Dr. Hilarie Fredrickson within the next week.  He said he travels a lot and will be in town and wants to get this done to try to figure out what's going on with him.  Please call patient and advise.  Thank you.

## 2022-01-02 NOTE — Telephone Encounter (Signed)
VCE 1st before OV as this will guide my recommendations

## 2022-01-03 NOTE — Telephone Encounter (Signed)
Left message for pt to call back  °

## 2022-01-03 NOTE — Telephone Encounter (Signed)
Pt scheduled for capsule endo 01/09/22, instructions sent to pt and PA already obtained. Pt aware of appt. Pt scheduled for appt with Dr. Hilarie Fredrickson 01/23/22 at 10:10am.

## 2022-01-08 ENCOUNTER — Telehealth: Payer: Self-pay | Admitting: Internal Medicine

## 2022-01-09 ENCOUNTER — Ambulatory Visit (INDEPENDENT_AMBULATORY_CARE_PROVIDER_SITE_OTHER): Payer: BC Managed Care – PPO | Admitting: Internal Medicine

## 2022-01-09 ENCOUNTER — Encounter: Payer: Self-pay | Admitting: Internal Medicine

## 2022-01-09 DIAGNOSIS — K5000 Crohn's disease of small intestine without complications: Secondary | ICD-10-CM | POA: Diagnosis not present

## 2022-01-09 NOTE — Progress Notes (Signed)
Capsule ID: [id number] Exp: 03/19/2023 LOT: [id number]  Patient arrived for VCE. Reported the prep went well. This RN explained capsule dietary restrictions for the next few hours. Pt advised to return at 4 pm to return capsule equipment.  Patient verbalized understanding. Opened capsule, ensured capsule was flashing prior to the patient swallowing the capsule. Patient swallowed capsule without difficulty.  Patient told to call the office with any questions and if capsule has not passed after 72 hours. No further questions by the conclusion of the visit.

## 2022-01-09 NOTE — Patient Instructions (Signed)
Contact our office immediately at [phone] if you suffer from any abdominal pain, nausea, or vomiting during capsule endoscopy. a) Do not eat or drink for at least 2 hours. After 2 hours you may have any of the following to drink: Water   White grape juice 7-Up   Chicken Bouillon Sprite   Ginger Ale c) After 4 hours you may have a light snack to include any of the following: A cup of soup   sandwich Bowl of cereal  Rice Toast   Eggs 2-3 small cookies (i.e. vanilla wafers or graham crackers) d) After 8 hours you may return to your regular diet. During your procedure do not go near anyone else that is having capsule endoscopy. Do not be in close contact with an MRI machine or a radio or television tower. Do not wear a heavy coat or sweater because your recorder may over heat and stop recording.   Do not disconnect the equipment or remove the belt at any time.  Since the Data Recorder is actually a small computer, it should be treated with utmost care and protection.  Avoid sudden movement and banging of the Data Recorder.  Do not do any heavy lifting or strenuous physical activity during the test especially if it involves sweating and do not bend over or stoop during capsule endoscopy. During capsule endoscopy, you will need to verify every 15 minutes that the small light on top of the Data Recorder is blinking twice per second.  If for some reason it stops blinking at this site, record the time and contact our office at [phone].

## 2022-01-11 ENCOUNTER — Telehealth: Payer: Self-pay | Admitting: Internal Medicine

## 2022-01-11 DIAGNOSIS — R350 Frequency of micturition: Secondary | ICD-10-CM | POA: Diagnosis not present

## 2022-01-11 DIAGNOSIS — Z131 Encounter for screening for diabetes mellitus: Secondary | ICD-10-CM | POA: Diagnosis not present

## 2022-01-11 DIAGNOSIS — R110 Nausea: Secondary | ICD-10-CM | POA: Diagnosis not present

## 2022-01-11 NOTE — Telephone Encounter (Signed)
Patient called and stated that he needs a refill for Zofran and would like a 3 month supply. Please advise.  ?

## 2022-01-12 ENCOUNTER — Other Ambulatory Visit: Payer: Self-pay

## 2022-01-12 ENCOUNTER — Other Ambulatory Visit: Payer: BC Managed Care – PPO

## 2022-01-12 ENCOUNTER — Telehealth: Payer: Self-pay

## 2022-01-12 DIAGNOSIS — K5000 Crohn's disease of small intestine without complications: Secondary | ICD-10-CM

## 2022-01-12 DIAGNOSIS — T184XXA Foreign body in colon, initial encounter: Secondary | ICD-10-CM

## 2022-01-12 MED ORDER — ONDANSETRON 4 MG PO TBDP: 4.0000 mg | ORAL_TABLET | Freq: Three times a day (TID) | ORAL | 1 refills | Status: DC | PRN

## 2022-01-12 MED ORDER — AZATHIOPRINE 100 MG PO TABS: 200.0000 mg | ORAL_TABLET | Freq: Every day | ORAL | 3 refills | Status: DC

## 2022-01-12 MED ORDER — ONDANSETRON 4 MG PO TBDP: 4.0000 mg | ORAL_TABLET | Freq: Three times a day (TID) | ORAL | 0 refills | Status: DC | PRN

## 2022-01-12 NOTE — Telephone Encounter (Signed)
Pt aware of results of capsule endo per Dr. Hilarie Fredrickson. New script sent to pharmacy for AZA and Zofran per Dr. Hilarie Fredrickson. Pt to come for fecal calpro, he knows he needs labs in 4 weeks and then 2 mth. Reminder in epic. Pt knows to keep his OV as scheduled. He requested a copy of his capsule endo report, it has been left at the front desk for him to pickup. ?

## 2022-01-12 NOTE — Telephone Encounter (Signed)
Left message for pt to call back  °

## 2022-01-12 NOTE — Telephone Encounter (Signed)
Jacob [name] forgot to mention that he has not passed the capsule, would like advice on how to proceed. ?

## 2022-01-12 NOTE — Telephone Encounter (Signed)
Rx sent for zofran 3 month supply. ?

## 2022-01-12 NOTE — Addendum Note (Signed)
Addended by: Rosanne Sack R on: 01/12/2022 02:45 PM ? ? Modules accepted: Orders ? ?

## 2022-01-12 NOTE — Telephone Encounter (Signed)
Discussed with pt that if he does not think he has passed the capsule to come in Monday to have an xray done to see if he has passed it or not. Pt verbalized understanding. ?

## 2022-01-15 ENCOUNTER — Telehealth: Payer: Self-pay

## 2022-01-15 ENCOUNTER — Telehealth: Payer: Self-pay | Admitting: Internal Medicine

## 2022-01-15 ENCOUNTER — Ambulatory Visit (INDEPENDENT_AMBULATORY_CARE_PROVIDER_SITE_OTHER)
Admission: RE | Admit: 2022-01-15 | Discharge: 2022-01-15 | Disposition: A | Discharge status: Home / Self Care | Payer: BC Managed Care – PPO | Source: Ambulatory Visit | Attending: Internal Medicine | Admitting: Internal Medicine

## 2022-01-15 ENCOUNTER — Other Ambulatory Visit: Payer: Self-pay

## 2022-01-15 DIAGNOSIS — T184XXA Foreign body in colon, initial encounter: Secondary | ICD-10-CM | POA: Diagnosis not present

## 2022-01-15 DIAGNOSIS — Z0389 Encounter for observation for other suspected diseases and conditions ruled out: Secondary | ICD-10-CM | POA: Diagnosis not present

## 2022-01-15 MED ORDER — BUDESONIDE 3 MG PO CPEP: 9.0000 mg | ORAL_CAPSULE | Freq: Every day | ORAL | 1 refills | Status: DC

## 2022-01-15 NOTE — Telephone Encounter (Signed)
Would be ok to start ileal-release budesonide 9 mg daily (Entocort) x 8 weeks ?I will see him in clinic next week ?JMP ? ?

## 2022-01-15 NOTE — Telephone Encounter (Signed)
Pt aware, script sent to pharmacy. ?

## 2022-01-15 NOTE — Telephone Encounter (Signed)
Pt called states he is not sure if he passed the capsule. Pt will come in for KUB to see if he has passed the capsule, order in epic. Pt also reports he is still having some abdominal discomfort. Pt wonders if he should go ahead and start on budesonide that Dr. Hilarie Fredrickson mentioned on the capsule endo results or if he should wait until he sees Dr. Hilarie Fredrickson next week. He just started the higher dose of Imuran Friday. He is not having any diarrhea and no blood in the stool. Please advise. ?

## 2022-01-15 NOTE — Telephone Encounter (Signed)
error 

## 2022-01-17 ENCOUNTER — Encounter: Payer: Self-pay | Admitting: Internal Medicine

## 2022-01-17 LAB — CALPROTECTIN, FECAL: Calprotectin, Fecal: <16 ug/g (ref 0–120)

## 2022-01-17 LAB — SPECIMEN STATUS REPORT

## 2022-01-23 ENCOUNTER — Other Ambulatory Visit (INDEPENDENT_AMBULATORY_CARE_PROVIDER_SITE_OTHER): Payer: BC Managed Care – PPO

## 2022-01-23 ENCOUNTER — Encounter: Payer: Self-pay | Admitting: Internal Medicine

## 2022-01-23 ENCOUNTER — Ambulatory Visit: Payer: BC Managed Care – PPO | Admitting: Internal Medicine

## 2022-01-23 VITALS — BP 120/64 | HR 69 | Ht 68.0 in | Wt 200.0 lb

## 2022-01-23 DIAGNOSIS — R1013 Epigastric pain: Secondary | ICD-10-CM

## 2022-01-23 DIAGNOSIS — K5000 Crohn's disease of small intestine without complications: Secondary | ICD-10-CM

## 2022-01-23 DIAGNOSIS — R350 Frequency of micturition: Secondary | ICD-10-CM

## 2022-01-23 DIAGNOSIS — R110 Nausea: Secondary | ICD-10-CM

## 2022-01-23 DIAGNOSIS — K589 Irritable bowel syndrome without diarrhea: Secondary | ICD-10-CM

## 2022-01-23 DIAGNOSIS — K219 Gastro-esophageal reflux disease without esophagitis: Secondary | ICD-10-CM

## 2022-01-23 DIAGNOSIS — R5383 Other fatigue: Secondary | ICD-10-CM

## 2022-01-23 LAB — CORTISOL: Cortisol, Plasma: 7.6 ug/dL

## 2022-01-23 LAB — COMPREHENSIVE METABOLIC PANEL
ALT: 22 U/L (ref 0–53)
AST: 25 U/L (ref 0–37)
Albumin: 4.8 g/dL (ref 3.5–5.2)
Alkaline Phosphatase: 72 U/L (ref 39–117)
BUN: 9 mg/dL (ref 6–23)
CO2: 29 mEq/L (ref 19–32)
Calcium: 10 mg/dL (ref 8.4–10.5)
Chloride: 100 mEq/L (ref 96–112)
Creatinine, Ser: 0.89 mg/dL (ref 0.40–1.50)
GFR: 113.89 mL/min (ref >60.00)
Glucose, Bld: 93 mg/dL (ref 70–99)
Potassium: 3.8 mEq/L (ref 3.5–5.1)
Sodium: 136 mEq/L (ref 135–145)
Total Bilirubin: 0.5 mg/dL (ref 0.2–1.2)
Total Protein: 7.3 g/dL (ref 6.0–8.3)

## 2022-01-23 LAB — VITAMIN B12: Vitamin B-12: 293 pg/mL (ref 211–911)

## 2022-01-23 LAB — CBC WITH DIFFERENTIAL/PLATELET
Basophils Absolute: 0 10*3/uL (ref 0.0–0.1)
Basophils Relative: 0.6 % (ref 0.0–3.0)
Eosinophils Absolute: 0 10*3/uL (ref 0.0–0.7)
Eosinophils Relative: 0.5 % (ref 0.0–5.0)
HCT: 44.7 % (ref 39.0–52.0)
Hemoglobin: 15 g/dL (ref 13.0–17.0)
Lymphocytes Relative: 28 % (ref 12.0–46.0)
Lymphs Abs: 1.2 10*3/uL (ref 0.7–4.0)
MCHC: 33.6 g/dL (ref 30.0–36.0)
MCV: 95.6 fl (ref 78.0–100.0)
Monocytes Absolute: 0.4 10*3/uL (ref 0.1–1.0)
Monocytes Relative: 10.1 % (ref 3.0–12.0)
Neutro Abs: 2.5 10*3/uL (ref 1.4–7.7)
Neutrophils Relative %: 60.8 % (ref 43.0–77.0)
Platelets: 262 10*3/uL (ref 150.0–400.0)
RBC: 4.67 Mil/uL (ref 4.22–5.81)
RDW: 14.2 % (ref 11.5–15.5)
WBC: 4.1 10*3/uL (ref 4.0–10.5)

## 2022-01-23 LAB — TSH: TSH: 2.23 u[IU]/mL (ref 0.35–5.50)

## 2022-01-23 LAB — HEMOGLOBIN A1C: Hgb A1c MFr Bld: 5.5 % (ref 4.6–6.5)

## 2022-01-23 LAB — T4, FREE: Free T4: 0.92 ng/dL (ref 0.60–1.60)

## 2022-01-23 MED ORDER — DEXLANSOPRAZOLE 60 MG PO CPDR: 60.0000 mg | DELAYED_RELEASE_CAPSULE | Freq: Every day | ORAL | 0 refills | Status: DC

## 2022-01-23 NOTE — Patient Instructions (Addendum)
You have been scheduled for a MyChart video visit with Dr Hilarie Fredrickson on 03/27/22 at 3:20 pm. ? ?Your provider has requested that you go to the basement level for lab work before leaving today. Press "B" on the elevator. The lab is located at the first door on the left as you exit the elevator. ? ?We have sent the following medications to your pharmacy for you to pick up at your convenience: ?Dexilant 60 mg daily (changed from your current pantoprazole prescription). ? ?Continue Zofran as needed for nausea. ? ?Continue Dicyclomine as directed. ? ?Discontinue pantoprazole. ? ?Complete your budesonide prescription. ? ?We will work on getting you approved for Humira. Once you have heard from [place] about approval, you have discontinue Azathioprine. ? ?If you are age 32 or older, your body mass index should be between 23-30. Your Body mass index is 30.41 kg/m?Marland Kitchen If this is out of the aforementioned range listed, please consider follow up with your Primary Care Provider. ? ?If you are age 53 or younger, your body mass index should be between 19-25. Your Body mass index is 30.41 kg/m?Marland Kitchen If this is out of the aformentioned range listed, please consider follow up with your Primary Care Provider.  ? ?________________________________________________________ ? ?The [facility] providers would like to encourage you to use [facility] to communicate with providers for non-urgent requests or questions.  Due to long hold times on the telephone, sending your provider a message by [facility] may be a faster and more efficient way to get a response.  Please allow 48 business hours for a response.  Please remember that this is for non-urgent requests.  ?_______________________________________________________ ?Due to recent changes in healthcare laws, you may see the results of your imaging and laboratory studies on MyChart before your provider has had a chance to review them.  We understand that in some cases there may be results that are confusing or  concerning to you. Not all laboratory results come back in the same time frame and the provider may be waiting for multiple results in order to interpret others.  Please give [place] 48 hours in order for your provider to thoroughly review all the results before contacting the office for clarification of your results.  ? ?

## 2022-01-23 NOTE — Progress Notes (Signed)
? ?Subjective:  ? ? Patient ID: Jacob [name], male    DOB: 02-10-1991, 31 y.o.   MRN: [id number] ? ?HPI ?Baruch Lewers is a 31 year old male with a history of Crohn's ileitis (dx 2015) on azathioprine, GERD, previously treated H. pylori, irritable bowel syndrome who is here for follow-up.  He was last seen in November 2022.  He is here alone today. ? ?Since being seen here last he had a normal fecal calprotectin which did not correlate with his video capsule endoscopy.  This showed terminal ileitis with edema, erosions and shallow ulcerations.  He has been on Entocort 9 mg daily over the last few days. ? ?He reports that he was traveling in [place] and developed extreme nausea and vomiting.  Stools were loose during that time.  He felt fatigued but did not have fever.  He was unsure if this was related to Crohn's disease or something else.  Lasted about 5 days but was nearly completely gone by the time that he returned home.  He also noted high stress during this trip. ? ?He reports that he has daily symptoms that he has learned to live with though his girlfriend who is finishing a pediatric residency tells him that this is not normal.  He hopes to feel better.  He describes waking up every morning with an uncomfortable feeling in the mid to lower abdomen.  At times it feels sour and at other times it feels tense and tight.  Typically gets better after a bowel movement.  He is not having diarrhea and at times feels slightly constipated.  He has some low-level nausea on most mornings.  He is also noted some eye pain and headache.  His appetite fluctuates though seems to always be down in the morning.  He reports that he also gets very hungry in the evening and eats very large meals.  He is having some dyspeptic symptoms and intermittent indigestion despite pantoprazole.  He does use pantoprazole daily.  He takes famotidine on and off.  Uses Bentyl on and off.  Zofran is helpful when he uses it for nausea.  Separate from  this he notes frequent urination and a friend suggested that he needs to be tested for diabetes. ? ? ?Review of Systems ?As per HPI, otherwise negative ? ?Current Medications, Allergies, Past Medical History, Past Surgical History, Family History and Social History were reviewed in [employer] record. ? ?   ?Objective:  ? Physical Exam ?BP 120/64   Pulse 69   Ht 5' 8"  (1.727 m)   Wt 200 lb (90.7 kg)   BMI 30.41 kg/m?  ?Gen: awake, alert, NAD ?HEENT: anicteric, op clear ?CV: RRR, no mrg ?Pulm: CTA b/l ?Abd: soft, mildly tender in the epigastrium and left lower quadrant without rebound or guarding, nondistended, +BS throughout ?Ext: no c/c/e ?Neuro: nonfocal ? ? ?   ?Assessment & Plan:  ?31 year old male with a history of Crohn's ileitis (dx 2015) on azathioprine, GERD, previously treated H. pylori, irritable bowel syndrome who is here for follow-up.  ? ?Crohn's ileitis (dx 2015) --he has been maintained on azathioprine and I recently increase the dose to 200 mg daily based on a low 6-TG level from last summer.  He is having symptoms including intermittent abdominal pain, nausea and fatigue.  It is clear that azathioprine has not been able to maintain endoscopic remission and given the symptoms we have discussed changing therapy.  We discussed that azathioprine could also be contributing to  his low-level nausea.  After thorough discussion I am going to recommend Humira.  We reviewed the risks, benefits and alternatives to Humira.  We discussed the small and rare risk of lymphoma (but this is no different than with azathioprine).  We also discussed the rare risk of reactivation of tuberculosis, viral hepatitis, infection, rash, heart failure and demyelinating disease.  I have also recommended that he continue the Entocort for 6 weeks. ?-- Plan to start Humira with standard induction followed by 40 mg every 14 days ?--Discontinue azathioprine once Humira is started ?--Complete Entocort 9 mg  daily x6 weeks for active ileitis seen on recent video capsule endoscopy ?--Virtual visit follow-up with me in May ?--QuantiFERON gold, viral hepatitis serologies ? ?2.  Fatigue/frequent urination --I am going to check a.m. cortisol, thyroid function, B12 and hemoglobin A1c.  I think it is very unlikely that he has diabetes. ? ?3. IBS --I do think some of his bowel symptoms particularly those related to intermittent discomfort and nausea worsening with stress may relate to IBS.  He can continue Bentyl 20 mg 3 times daily as needed. ?--Dicyclomine 20 mg twice daily as needed ?--Gave him a copy of the FODMAP diet to see if this helps with some of his GI symptoms ? ?4.  Chronic nausea --possibly medicine related, see #1.  Also see #5. ?--Ondansetron 4 mg 3 times daily as needed ? ?5.  GERD and dyspepsia --occurring despite pantoprazole.  Some of this may be related to diet and the fact that he eats large meals often late at night.  We discussed GERD diet as well as FODMAP diet ?--Change pantoprazole to Dexilant 60 mg once daily ?--Could still use famotidine 20 mg in the evening if needed for breakthrough heartburn ? ?45 minutes total spent today including patient facing time, coordination of care, reviewing medical history/procedures/pertinent radiology studies, and documentation of the encounter. ? ? ?

## 2022-01-24 LAB — HEPATITIS B SURFACE ANTIBODY,QUALITATIVE: Hep B S Ab: NONREACTIVE

## 2022-01-24 LAB — HEPATITIS C ANTIBODY
Hepatitis C Ab: NONREACTIVE
SIGNAL TO CUT-OFF: <0.02 (ref <1.00)

## 2022-01-24 LAB — HEPATITIS B SURFACE ANTIGEN: Hepatitis B Surface Ag: NONREACTIVE

## 2022-01-24 LAB — HEPATITIS B CORE ANTIBODY, TOTAL: Hep B Core Total Ab: NONREACTIVE

## 2022-01-26 LAB — QUANTIFERON-TB GOLD PLUS
Mitogen-NIL: >10 IU/mL
NIL: 0.04 IU/mL
QuantiFERON-TB Gold Plus: NEGATIVE
TB1-NIL: 0 IU/mL
TB2-NIL: 0 IU/mL

## 2022-02-05 ENCOUNTER — Telehealth: Payer: Self-pay

## 2022-02-05 NOTE — Telephone Encounter (Signed)
PA initiated via CMM. Will await response from patient's insurance company ?CMM Key: [id number] ?

## 2022-02-05 NOTE — Telephone Encounter (Signed)
Your request has been approved ?Effective from 02/05/2022 through 02/04/2023. ? ? ?I called and discussed approval with patient. He stated that [facility] enrolled him in Humira co-pay program and his co-pay will be $5. Pt knows that [facility] will contact pt to set up shipment of his medication. Pt knows that he can discontinue Azathioprine. Pt verbalized understanding and had no concerns at the end of the call. ?

## 2022-02-05 NOTE — Telephone Encounter (Signed)
-----   Message from Jerene Bears, MD sent at 01/23/2022  1:38 PM EDT ----- ?Vaughan Basta ?New start Humira ?Standard induction followed by 40 mg every 14 days ?Once Humira approved please let him know he can stop his azathioprine ?QuantiFERON gold and viral hep otology labs pending from today ?Let me know if it is not approved ?Thanks ?JMP ? ?

## 2022-02-08 MED ORDER — HUMIRA (2 PEN) 40 MG/0.4ML ~~LOC~~ AJKT: 1.0000 "pen " | AUTO-INJECTOR | SUBCUTANEOUS | 6 refills | Status: DC

## 2022-02-08 MED ORDER — HUMIRA-CD/UC/HS STARTER 80 MG/0.8ML ~~LOC~~ AJKT: AUTO-INJECTOR | SUBCUTANEOUS | 0 refills | Status: DC

## 2022-02-08 NOTE — Telephone Encounter (Signed)
Pt called back in stating that the pharmacy stated that they are waiting on [place] to complete the PA. I told the patient that a PA has already been completed and approved by his insurance. I told pt that I will call the pharmacy and see what I could find out and give him a call back. Pt wanted to know if I would call him back today, I told him that I will call him as soon as I get more information.  ? ?I called [facility], I was informed that the test claim is being denied for the Humira starter dose. I informed them that we received approval through pt's insurance and it stated that the starter dose is included in the approval. I was transferred to [place] with insurance verification, she states that she is going to reach out to [employer] and see if they can give her more information. I spent over 35 minutes on the phone with Jarrett Soho, she spoke with pt's insurance and they are requiring an additional PA for the Humira starter dose. I submitted this via CMM Key: [id number]. Jarrett Soho stated that she will follow up on this tomorrow. They ran a test claim on the maintenance dose and that was covered with no problem. I called the pt back and informed him of all of this information. Pt verbalized understanding and had no concerns at the end of the call. ?

## 2022-02-08 NOTE — Addendum Note (Signed)
Addended by: Yevette Edwards on: 02/08/2022 08:18 AM ? ? Modules accepted: Orders ? ?

## 2022-02-08 NOTE — Telephone Encounter (Signed)
Pt returned call. I informed him that we have sent his Humira prescriptions to [facility] since [facility] was not able to fill this for him. Pt states that he received a text from [facility], I provided him with the phone number to call [place] to make sure they have all of the information that they need from him. He is aware of the turn around time for insurance verification process. Pt has been advised to provide the pharmacy with this Humira co-pay assistance card when he talks with them. Pt verbalized understanding and had no concerns at the end of the call. ?

## 2022-02-08 NOTE — Telephone Encounter (Signed)
Received an additional fax from [place] stating that Humira was approved, but [facility] is not able to fill. The prescriptions have been transferred to [place]: [phone], F: [phone]. I called and spoke with Gwendolyn at [facility], she states that there is no profile created for the patient. I told Gwendolyn that we will e-prescribe the prescriptions and fax over the authorization from patient's insurance. She asked that we fax approval to [phone], after RX has been received it may take about 24-48 hours for insurance verification. ? ?A copy of the approval letter has been sent to be scanned into patient's chart and a copy has been faxed to [place] as requested.  ? ?Lm on vm for patient to return call. ?

## 2022-02-09 ENCOUNTER — Encounter: Payer: Self-pay | Admitting: Internal Medicine

## 2022-02-09 NOTE — Telephone Encounter (Signed)
Received a call from [facility] stating that they still have not gotten the approval for the starter dose. I was told that I will have to call the PA department with pt's insurance and discuss with them. The PA submitted for the Humira starter kit was cancelled because it is a duplicate request and the starter dose should be approved within the initial auth. I was transferred to the PA dept [phone], option 3, and option 3). ? ?I spoke with Seth Bake in the PA dept at [facility] for over 30 minutes, she stated that they ran a test claim and the pharmacy is using the incorrect GPI. She stated that the pharmacy is using the GPI ending in [place]. I was told to call the pharmacy and give them the additional GPI's that they can try and the starter kit should go through.  ?[phone] [place] ?[phone] F430 ?[phone] F450 ? ?I called back to the pharmacy and spoke with Mateo Flow. I provided her with the information that was given to me by the insurance. She states that they will have to get this information back to the pharmacy and asked that we call back on Monday afternoon to follow up on this (P: [phone]). ?

## 2022-02-12 NOTE — Telephone Encounter (Signed)
Called pharmacy to follow up on RX. I spoke with Raquel Sarna, she transferred me to Garret Reddish, with insurance verification. I was informed that they are sending a STAT message to see if this can be expedited since this has been going on for some time. I provided Phineas Real with the codes provided to me by patient's insurance. Phineas Real stated that she included her supervisor on this e-mail. They plan to reach out to the pt's health plan to see if they can get an override. Phineas Real states that this will take another 24 hours. Will need to follow up again. ?

## 2022-02-15 ENCOUNTER — Other Ambulatory Visit: Payer: Self-pay

## 2022-02-15 ENCOUNTER — Telehealth: Payer: Self-pay

## 2022-02-15 MED ORDER — HUMIRA 40 MG/0.8ML ~~LOC~~ PSKT: 40.0000 mg | PREFILLED_SYRINGE | SUBCUTANEOUS | 11 refills | Status: DC

## 2022-02-15 MED ORDER — HUMIRA-PED<40KG CROHNS STARTER 80 MG/0.8ML & 40MG/0.4ML ~~LOC~~ PSKT: PREFILLED_SYRINGE | SUBCUTANEOUS | 0 refills | Status: DC

## 2022-02-15 NOTE — Telephone Encounter (Signed)
See additional note, orders given for PFS as pen kit was not approved. ?

## 2022-02-15 NOTE — Telephone Encounter (Signed)
Spoke with pharmacy and gave order for prefilled syringe as insurance did not approve the pens. ?

## 2022-02-15 NOTE — Telephone Encounter (Signed)
A customer rep called from [facility] phone # [phone] in regards to [place]. The prior authorization was approved for syringes not pens. They are requesting [place] to either do an appeal to get it changed to pens or please send a new rx for the syringes. She said anyone can help if we call them back, she doesn't have a direct line.  ?

## 2022-03-27 ENCOUNTER — Telehealth (INDEPENDENT_AMBULATORY_CARE_PROVIDER_SITE_OTHER): Payer: BC Managed Care – PPO | Admitting: Internal Medicine

## 2022-03-27 DIAGNOSIS — R112 Nausea with vomiting, unspecified: Secondary | ICD-10-CM

## 2022-03-27 DIAGNOSIS — K5000 Crohn's disease of small intestine without complications: Secondary | ICD-10-CM | POA: Diagnosis not present

## 2022-03-27 DIAGNOSIS — K219 Gastro-esophageal reflux disease without esophagitis: Secondary | ICD-10-CM | POA: Diagnosis not present

## 2022-03-27 DIAGNOSIS — K589 Irritable bowel syndrome without diarrhea: Secondary | ICD-10-CM

## 2022-03-28 ENCOUNTER — Other Ambulatory Visit: Payer: Self-pay | Admitting: Internal Medicine

## 2022-03-28 NOTE — Progress Notes (Signed)
? ?  Subjective:  ? ? Patient ID: Jacob [name], male    DOB: [birth year], 31 y.o.   MRN: [id number] ? ?This service was provided via telemedicine with audio/visual communication. ?The patient was located at work ?The provider was located in provider's GI office. ?The patient did consent to this telephone visit and is aware of possible charges through their insurance for this visit.   ?The persons participating in this telemedicine service were the patient and I. ?Time spent on call:  16 min ? ? ?HPI ?[name] is a 31 year old male with a history of Crohn's ileitis (diagnosis 2015) on recently initiated Humira (previously azathioprine since diagnosis), previously treated H. pylori gastritis, IBS who is seen for follow-up.  He is seen by A/V MyChart visit today. ? ?After his last visit he took 6 weeks of ileal release budesonide therapy.  He also transition to Humira and he has completed induction and 140 mg dose.  He is tolerating this well.  He is also using Bentyl 20 mg.  He is feeling better overall.  He reports that symptoms improved about a week after Entocort was started and the beneficial effect has continued.  He has had less uncomfortable lower abdominal sensation.  Bowel movements have been regular.  No blood in stool or melena. ? ?He has still had intermittent though possibly less nausea in the morning.  He is having some intermittent vomiting typically in the morning and this could be food that was eaten greater than 6 hours previously.  He is having some regurgitation though he feels that Dexilant may be working better than pantoprazole was previously. ? ? ?Review of Systems ?As per HPI, otherwise negative ? ?Current Medications, Allergies, Past Medical History, Past Surgical History, Family History and Social History were reviewed in [employer] record. ?   ?Objective:  ? Physical Exam ?There were no vitals taken for this visit. ?Gen: awake, alert, NAD, well-appearing by video  today ?HEENT: anicteric  ? ? ?   ?Assessment & Plan:  ?30 year old male with a history of Crohn's ileitis (diagnosis 2015) on recently initiated Humira (previously azathioprine since diagnosis), previously treated H. pylori gastritis, IBS who is seen for follow-up. ? ?Crohn's ileitis (dx 2015) --we have transition from azathioprine which has been stopped to Humira 40 mg every 14 days.  He seems to be tolerating this well.  He has completed Entocort. ?--Continue Humira 40 mg every 14 days ?--Remain off of Entocort and azathioprine ?--Follow-up with me in about 3 months by video visit or in person visit. ? ?2.  Nausea and intermittent vomiting --- some symptoms sound suspicious for gastroparesis given that he can have vomiting of ingested food many hours, greater than 6, after eating.  I had some suspicion that some of his nausea may have been azathioprine related but he is now off of this medicine.  Symptoms do not sound obstructive in nature at this point. ?--Proceed with 4-hour gastric emptying scan ?--Can continue ondansetron 4 mg 3 times daily as needed; this should be avoided 36 hours prior to gastric emptying scan ? ?3.  IBS --some symptoms overlapping with #1.  Can continue Bentyl 20 mg twice daily as needed ? ?4.  GERD and dyspepsia --continue Dexilant 60 mg daily ? ?20 minutes total spent today including patient facing time, coordination of care, reviewing medical history/procedures/pertinent radiology studies, and documentation of the encounter. ? ? ?

## 2022-03-29 NOTE — Addendum Note (Signed)
Addended by: Larina Bras on: 03/29/2022 08:16 AM   Modules accepted: Orders

## 2022-04-03 ENCOUNTER — Telehealth: Payer: Self-pay | Admitting: Internal Medicine

## 2022-04-03 MED ORDER — ONDANSETRON 4 MG PO TBDP
4.0000 mg | ORAL_TABLET | Freq: Three times a day (TID) | ORAL | 0 refills | Status: DC | PRN
Start: 2022-04-03 — End: 2022-09-03

## 2022-04-03 MED ORDER — DEXLANSOPRAZOLE 60 MG PO CPDR: 60.0000 mg | DELAYED_RELEASE_CAPSULE | Freq: Every day | ORAL | 0 refills | Status: DC

## 2022-04-03 NOTE — Telephone Encounter (Signed)
Patient called requested refill on Zofran and Dexilant sent to [facility] on [address]. Also requested a 90 day supply because he is traveling.

## 2022-04-03 NOTE — Telephone Encounter (Signed)
3 Month rx sent for dexilant and zofran to [facility] as per patient request. Patient does need to schedule his gastric emptying scan and will need follow up appointment as recently indicated. In addition, current prescriptions for dexilant and zofran have been discontinued with [facility].

## 2022-04-26 ENCOUNTER — Telehealth: Payer: Self-pay | Admitting: *Deleted

## 2022-04-26 NOTE — Telephone Encounter (Signed)
-----   Message from Larina Bras, [place] sent at 04/20/2022  3:43 PM EDT ----- Left another message for patient to call back. ----- Message ----- From: Larina Bras, CMA Sent: 04/20/2022  12:00 AM EDT To: Larina Bras, CMA  Left message for patient to call back. I need to make him aware of his virtual appointment and ask him to schedule GES since he still hasnt returned radiology call ----- Message ----- From: Larina Bras, CMA Sent: 04/19/2022  12:00 AM EDT To: Larina Bras, CMA   ----- Message ----- From: Larina Bras, CMA Sent: 04/12/2022  12:00 AM EDT To: Larina Bras, CMA   ----- Message ----- From: Larina Bras, CMA Sent: 04/05/2022  12:00 AM EDT To: Larina Bras, CMA  Pt needs virtual appt around 06/28/22. Schedule and make him aware. Did he get scheduled for GES?

## 2022-04-26 NOTE — Telephone Encounter (Signed)
I have left final message for patient to call back. I will also send a letter in the mail since I havent been able to reach him by phone.

## 2022-06-04 ENCOUNTER — Telehealth: Payer: Self-pay | Admitting: Internal Medicine

## 2022-06-05 NOTE — Telephone Encounter (Signed)
Inbound call from patient calling back to inquire if a letter from the doctor regarding patient pre existing condition be faxed to [employer] at [phone] and [phone]. Patient states he rented a space in [place] for 6 months but is currently living in [place] for treatment. Patient states letter is needed to break his lease. Patient states he will upload letter on mychart also in regards  to the information he will need. Please give patient a callback to advise.  Thank you

## 2022-06-11 ENCOUNTER — Encounter: Payer: Self-pay | Admitting: Internal Medicine

## 2022-06-11 NOTE — Telephone Encounter (Signed)
Letter sent to pt via mychart. °

## 2022-06-11 NOTE — Telephone Encounter (Signed)
Letter written and routed to [facility]

## 2022-06-29 ENCOUNTER — Telehealth: Payer: BC Managed Care – PPO | Admitting: Internal Medicine

## 2022-07-03 ENCOUNTER — Encounter: Payer: Self-pay | Admitting: Internal Medicine

## 2022-08-04 ENCOUNTER — Encounter: Payer: Self-pay | Admitting: Internal Medicine

## 2022-08-07 NOTE — Telephone Encounter (Signed)
Virtual video visit can be scheduled with the patient when an opening arises

## 2022-09-03 ENCOUNTER — Emergency Department (HOSPITAL_BASED_OUTPATIENT_CLINIC_OR_DEPARTMENT_OTHER): Payer: BC Managed Care – PPO

## 2022-09-03 ENCOUNTER — Emergency Department (HOSPITAL_BASED_OUTPATIENT_CLINIC_OR_DEPARTMENT_OTHER)
Admission: EM | Admit: 2022-09-03 | Discharge: 2022-09-03 | Disposition: A | Discharge status: Home / Self Care | Payer: BC Managed Care – PPO | Attending: Emergency Medicine | Admitting: Emergency Medicine

## 2022-09-03 ENCOUNTER — Encounter (HOSPITAL_BASED_OUTPATIENT_CLINIC_OR_DEPARTMENT_OTHER): Payer: Self-pay | Admitting: Emergency Medicine

## 2022-09-03 ENCOUNTER — Telehealth: Payer: Self-pay | Admitting: Internal Medicine

## 2022-09-03 ENCOUNTER — Other Ambulatory Visit: Payer: Self-pay

## 2022-09-03 DIAGNOSIS — Z1152 Encounter for screening for COVID-19: Secondary | ICD-10-CM | POA: Diagnosis not present

## 2022-09-03 DIAGNOSIS — R112 Nausea with vomiting, unspecified: Secondary | ICD-10-CM | POA: Diagnosis not present

## 2022-09-03 DIAGNOSIS — R197 Diarrhea, unspecified: Secondary | ICD-10-CM | POA: Insufficient documentation

## 2022-09-03 DIAGNOSIS — R1032 Left lower quadrant pain: Secondary | ICD-10-CM | POA: Insufficient documentation

## 2022-09-03 LAB — CBC
HCT: 49.6 % (ref 39.0–52.0)
Hemoglobin: 17.2 g/dL — ABNORMAL HIGH (ref 13.0–17.0)
MCH: 31.7 pg (ref 26.0–34.0)
MCHC: 34.7 g/dL (ref 30.0–36.0)
MCV: 91.3 fL (ref 80.0–100.0)
Platelets: 233 10*3/uL (ref 150–400)
RBC: 5.43 MIL/uL (ref 4.22–5.81)
RDW: 12.1 % (ref 11.5–15.5)
WBC: 4.9 10*3/uL (ref 4.0–10.5)
nRBC: 0 % (ref 0.0–0.2)

## 2022-09-03 LAB — URINALYSIS, ROUTINE W REFLEX MICROSCOPIC
Bilirubin Urine: NEGATIVE
Glucose, UA: NEGATIVE mg/dL
Hgb urine dipstick: NEGATIVE
Ketones, ur: 40 mg/dL — AB
Leukocytes,Ua: NEGATIVE
Nitrite: NEGATIVE
Protein, ur: NEGATIVE mg/dL
Specific Gravity, Urine: 1.029 (ref 1.005–1.030)
pH: 6 (ref 5.0–8.0)

## 2022-09-03 LAB — COMPREHENSIVE METABOLIC PANEL
ALT: 49 U/L — ABNORMAL HIGH (ref 0–44)
AST: 30 U/L (ref 15–41)
Albumin: 5.1 g/dL — ABNORMAL HIGH (ref 3.5–5.0)
Alkaline Phosphatase: 71 U/L (ref 38–126)
Anion gap: 12 (ref 5–15)
BUN: 11 mg/dL (ref 6–20)
CO2: 25 mmol/L (ref 22–32)
Calcium: 9.7 mg/dL (ref 8.9–10.3)
Chloride: 98 mmol/L (ref 98–111)
Creatinine, Ser: 1.05 mg/dL (ref 0.61–1.24)
GFR, Estimated: >60 mL/min (ref >60)
Glucose, Bld: 92 mg/dL (ref 70–99)
Potassium: 3.9 mmol/L (ref 3.5–5.1)
Sodium: 135 mmol/L (ref 135–145)
Total Bilirubin: 0.8 mg/dL (ref 0.3–1.2)
Total Protein: 7.8 g/dL (ref 6.5–8.1)

## 2022-09-03 LAB — LIPASE, BLOOD: Lipase: 11 U/L (ref 11–51)

## 2022-09-03 LAB — SARS CORONAVIRUS 2 BY RT PCR: SARS Coronavirus 2 by RT PCR: NEGATIVE

## 2022-09-03 MED ORDER — OXYCODONE-ACETAMINOPHEN 5-325 MG PO TABS: 1.0000 | ORAL_TABLET | Freq: Four times a day (QID) | ORAL | 0 refills | Status: DC | PRN

## 2022-09-03 MED ORDER — MORPHINE SULFATE (PF) 4 MG/ML IV SOLN
4.0000 mg | Freq: Once | INTRAVENOUS | Status: AC
  Administered 2022-09-03: 4 mg via INTRAVENOUS
  Filled 2022-09-03: qty 1

## 2022-09-03 MED ORDER — IOHEXOL 300 MG/ML  SOLN
100.0000 mL | Freq: Once | INTRAMUSCULAR | Status: AC | PRN
  Administered 2022-09-03: 85 mL via INTRAVENOUS

## 2022-09-03 MED ORDER — ONDANSETRON HCL 4 MG/2ML IJ SOLN
4.0000 mg | Freq: Once | INTRAMUSCULAR | Status: AC
  Administered 2022-09-03: 4 mg via INTRAVENOUS
  Filled 2022-09-03: qty 2

## 2022-09-03 MED ORDER — ONDANSETRON 4 MG PO TBDP: 4.0000 mg | ORAL_TABLET | Freq: Three times a day (TID) | ORAL | 0 refills | Status: DC | PRN

## 2022-09-03 MED ORDER — SODIUM CHLORIDE 0.9 % IV BOLUS
1000.0000 mL | Freq: Once | INTRAVENOUS | Status: AC
  Administered 2022-09-03: 1000 mL via INTRAVENOUS

## 2022-09-03 NOTE — ED Notes (Signed)
Dc instructions reviewed with patient. Patient voiced understanding. Dc with belongings.  °

## 2022-09-03 NOTE — Telephone Encounter (Signed)
Inbound call from patient states he is having sever abdominal pain, vomiting and having diarrhea, he is unable to keep any food down. Doesn't know if he should go to ED. Please advise.

## 2022-09-03 NOTE — ED Notes (Signed)
Patient transported to CT 

## 2022-09-03 NOTE — ED Triage Notes (Signed)
Pt arrived POV. Pt caox4 and ambulatory. Pt c/o abdominal pain, N/V/D since Sunday morning. Pt states pain is intermittent and is not hurting at present. Pt reports hx of Crohn's and that he contacted his GI doctor who told him to go to the ED. Pt denies pain at present.

## 2022-09-03 NOTE — Telephone Encounter (Signed)
Spoke with pt and let him know he should go to the ER to be evaluated. Pt verbalized understanding.

## 2022-09-03 NOTE — Discharge Instructions (Signed)
It was a pleasure taking care of you today!  Your labs today were overall unremarkable.  The CT scan today did not show any concerning findings.  It did show a small inguinal hernia containing fat only.  Ensure to maintain fluid intake.  Will be sent a prescription for Zofran, take as directed.  Within a short course of Percocet, take as directed.  It is important that you maintain your scheduled follow-up appoint with your GI specialist on next week.  Return to the emergency department if you are experiencing increasing/worsening symptoms.

## 2022-09-03 NOTE — ED Provider Notes (Signed)
[facility] EMERGENCY DEPT Provider Note   CSN: [id number] Arrival date & time: 09/03/22  1287     History  Chief Complaint  Patient presents with   Abdominal Pain    BRODIN GELPI is a 31 y.o. male with a past medical history of Crohn's who presents emergency department with concerns for abdominal pain onset yesterday morning.  Notes that his pain has been intermittent.  Denies abdominal pain at this time.  Called his GI doctor this morning who told him to come into the emergency department for further evaluation.  Has associated nausea, vomiting, nonbloody, watery diarrhea.  No meds tried prior to arrival.  Denies fever.     The history is provided by the patient. No language interpreter was used.       Home Medications Prior to Admission medications   Medication Sig Start Date End Date Taking? Authorizing Provider  oxyCODONE-acetaminophen (PERCOCET/ROXICET) 5-325 MG tablet Take 1 tablet by mouth every 6 (six) hours as needed for severe pain. 09/03/22  Yes Breylan Lefevers A, PA-C  Adalimumab (HUMIRA PEN) 40 MG/0.4ML PNKT Inject 1 pen. into the skin every 14 (fourteen) days. 02/08/22   Pyrtle, Lajuan Lines, MD  Adalimumab (HUMIRA PEN-CD/UC/HS STARTER) 80 MG/0.8ML PNKT Inject 2 pens (160 mg total) under the skin on day 1, inject 1 pen (80 mg total) under the skin on day 15 02/08/22   Pyrtle, Lajuan Lines, MD  ALPRAZolam Duanne Moron) 0.5 MG tablet Take 0.5 mg by mouth 2 (two) times daily as needed. 09/27/21   [provider]  azaTHIOprine (IMURAN) 50 MG tablet Take 200 mg by mouth daily. 01/15/22   [provider]  budesonide (ENTOCORT EC) 3 MG 24 hr capsule Take 3 capsules (9 mg total) by mouth daily. 01/15/22   Pyrtle, Lajuan Lines, MD  dexlansoprazole (DEXILANT) 60 MG capsule Take 1 capsule (60 mg total) by mouth daily. PLEASE SCHEDULE SCAN FOR ADDITIONAL REFILLS 04/03/22   Pyrtle, Lajuan Lines, MD  dicyclomine (BENTYL) 20 MG tablet TAKE 1 TABLET BY MOUTH EVERY 8 HOURS AS NEEDED FOR  SPASMS 10/03/21   Pyrtle, Lajuan Lines, MD  famotidine (PEPCID) 20 MG tablet TAKE 1 TABLET BY MOUTH EVERYDAY AT BEDTIME 05/09/21   Kennedy-Smith, Patrecia Pour, NP  ondansetron (ZOFRAN ODT) 4 MG disintegrating tablet Take 1 tablet (4 mg total) by mouth every 8 (eight) hours as needed for nausea or vomiting. 09/03/22   Nikki Glanzer A, PA-C  propranolol (INDERAL) 20 MG tablet Take 20 mg by mouth daily. 09/27/21   [provider]      Allergies    Patient has no known allergies.    Review of Systems   Review of Systems  Constitutional:  Negative for fever.  Gastrointestinal:  Positive for abdominal pain, diarrhea, nausea and vomiting.  All other systems reviewed and are negative.   Physical Exam Updated Vital Signs BP (!) 123/98 (BP Location: Right Arm)   Pulse 73   Temp 98.2 F (36.8 C) (Oral)   Resp 16   SpO2 100%  Physical Exam Vitals and nursing note reviewed.  Constitutional:      General: He is not in acute distress.    Appearance: He is not diaphoretic.  HENT:     Head: Normocephalic and atraumatic.     Mouth/Throat:     Pharynx: No oropharyngeal exudate.  Eyes:     General: No scleral icterus.    Conjunctiva/sclera: Conjunctivae normal.  Cardiovascular:     Rate and Rhythm: Normal rate  and regular rhythm.     Pulses: Normal pulses.     Heart sounds: Normal heart sounds.  Pulmonary:     Effort: Pulmonary effort is normal. No respiratory distress.     Breath sounds: Normal breath sounds. No wheezing.  Abdominal:     General: Bowel sounds are normal.     Palpations: Abdomen is soft. There is no mass.     Tenderness: There is abdominal tenderness in the left lower quadrant. There is no guarding or rebound.     Comments: LLQ TTP on exam without rebound, rigidity, or guarding.   Musculoskeletal:        General: Normal range of motion.     Cervical back: Normal range of motion and neck supple.  Skin:    General: Skin is warm and dry.  Neurological:     Mental Status:  He is alert.  Psychiatric:        Behavior: Behavior normal.     ED Results / Procedures / Treatments   Labs (all labs ordered are listed, but only abnormal results are displayed) Labs Reviewed  COMPREHENSIVE METABOLIC PANEL - Abnormal; Notable for the following components:      Result Value   Albumin 5.1 (*)    ALT 49 (*)    All other components within normal limits  CBC - Abnormal; Notable for the following components:   Hemoglobin 17.2 (*)    All other components within normal limits  URINALYSIS, ROUTINE W REFLEX MICROSCOPIC - Abnormal; Notable for the following components:   Ketones, ur 40 (*)    All other components within normal limits  SARS CORONAVIRUS 2 BY RT PCR  LIPASE, BLOOD    EKG None  Radiology CT ABDOMEN PELVIS W CONTRAST  Result Date: 09/03/2022 CLINICAL DATA:  LEFT lower quadrant abdominal pain EXAM: CT ABDOMEN AND PELVIS WITH CONTRAST TECHNIQUE: Multidetector CT imaging of the abdomen and pelvis was performed using the standard protocol following bolus administration of intravenous contrast. RADIATION DOSE REDUCTION: This exam was performed according to the departmental dose-optimization program which includes automated exposure control, adjustment of the mA and/or kV according to patient size and/or use of iterative reconstruction technique. CONTRAST:  21m OMNIPAQUE IOHEXOL 300 MG/ML  SOLN COMPARISON:  None Available. FINDINGS: Lower chest: Lung bases are clear. Hepatobiliary: No focal hepatic lesion. No biliary duct dilatation. Common bile duct is normal. Pancreas: Pancreas is normal. No ductal dilatation. No pancreatic inflammation. Spleen: Normal spleen Adrenals/urinary tract: Stomach, small-bowel and cecum normal. Post appendectomy. The colon and rectosigmoid colon are normal. Stomach/Bowel: Stomach, small bowel, appendix, and cecum are normal. The colon and rectosigmoid colon are normal. Vascular/Lymphatic: Abdominal aorta is normal caliber. No periportal or  retroperitoneal adenopathy. No pelvic adenopathy. Reproductive: Unremarkable Other: Small fat filled LEFT inguinal hernia measures 15 mm in diameter. Musculoskeletal: No aggressive osseous lesion. IMPRESSION: 1. No explanation for LEFT lower quadrant pain.  No diverticulosis. 2. Small fat filled LEFT inguinal hernia. Electronically Signed   By: SSuzy BouchardM.D.   On: 09/03/2022 11:07    Procedures Procedures    Medications Ordered in ED Medications  sodium chloride 0.9 % bolus 1,000 mL (0 mLs Intravenous Stopped 09/03/22 1200)  ondansetron (ZOFRAN) injection 4 mg (4 mg Intravenous Given 09/03/22 1112)  iohexol (OMNIPAQUE) 300 MG/ML solution 100 mL (85 mLs Intravenous Contrast Given 09/03/22 1049)  morphine (PF) 4 MG/ML injection 4 mg (4 mg Intravenous Given 09/03/22 1218)    ED Course/ Medical Decision Making/ A&P Clinical Course  as of 09/03/22 1530  Mon Sep 03, 2022  1216 Discussed with patient lab and imaging findings. Pt has a follow up video visit with his GI doctor on next week.  [SB]  1250 Re-evaluated and noted improvement of symptoms with treatment regimen. Discussed discharge treatment plan. Pt agreeable at this time. Pt appears safe for discharge. [SB]    Clinical Course User Index [SB] Harlow Carrizales A, PA-C                           Medical Decision Making Amount and/or Complexity of Data Reviewed Labs: ordered. Radiology: ordered.  Risk Prescription drug management.   Patient presents to the emergency department with abdominal pain, n/v/d onset 4 days intermittently. Has a history of chrons disease and it is managed by his GI specialist. Pt afebrile. On exam patient with LLQ TTP on exam without rebound, rigidity, or guarding. Remainder of exam without acute findings.  Pt afebrile. Differential diagnosis includes pancreatitis, diverticulitis, cholecystitis, appendicitis, acute cystitis.   Labs:  I ordered, and personally interpreted labs.  The pertinent results  include:  Lipase negative CBC without leukocytosis CMP with slightly elevated ALT at 49 and slightly elevated albumin, otherwise unremarkable COVID swab negative  Imaging: I ordered imaging studies including CT abdomen pelvis with I independently visualized and interpreted imaging which showed  1. No explanation for LEFT lower quadrant pain.  No diverticulosis.  2. Small fat filled LEFT inguinal hernia.   I agree with the radiologist interpretation  Medications:  I ordered medication including IVF, Zofran, and Morphine for pain management. Reevaluation of the patient after these medicines and interventions, I reevaluated the patient and found that they have improved I have reviewed the patients home medicines and have made adjustments as needed    Disposition: Presentation suspicious for viral gastroenteritis. Doubt diverticulitis, cholecystitis, appendicitis, acute cystitis, or pancreatitis at this time. After consideration of the diagnostic results and the patients response to treatment, I feel that the patient would benefit from Discharge home. Discharge home with prescription Zofran. PDMP reviewed, short course of percocet sent. Instructed patient to maintain scheduled follow up visit with his GI specialist regarding todays ED visit. Supportive care measures and strict return precautions discussed with patient at bedside. Pt acknowledges and verbalizes understanding. Pt appears safe for discharge. Follow up as indicated in discharge paperwork.   This chart was dictated using voice recognition software, Dragon. Despite the best efforts of this provider to proofread and correct errors, errors may still occur which can change documentation meaning.   Final Clinical Impression(s) / ED Diagnoses Final diagnoses:  Nausea and vomiting, unspecified vomiting type  Diarrhea, unspecified type    Rx / DC Orders ED Discharge Orders          Ordered    ondansetron (ZOFRAN ODT) 4 MG  disintegrating tablet  Every 8 hours PRN        09/03/22 1255    oxyCODONE-acetaminophen (PERCOCET/ROXICET) 5-325 MG tablet  Every 6 hours PRN        09/03/22 1301              Nahuel Wilbert A, PA-C 09/03/22 1530    Fransico Meadow, MD 09/04/22 2127

## 2022-09-04 NOTE — Telephone Encounter (Signed)
Left message for pt to call back  °

## 2022-09-04 NOTE — Telephone Encounter (Signed)
Inbound call from patient stating that he went to the ER yesterday like he was told and is requesting a call back to discuss. Please advise.

## 2022-09-05 MED ORDER — DIPHENOXYLATE-ATROPINE 2.5-0.025 MG PO TABS: ORAL_TABLET | ORAL | 0 refills | Status: AC

## 2022-09-05 NOTE — Telephone Encounter (Signed)
Spoke with pt and he is aware of recommendations but flew to [place] yesterday and goes to [place] tomorrow. He wants to know if your really feel strongly about doing the stool test. If so he could go to a [facility] in [place]. He would like some Lomotil if you are willing to prescribe this with him not doing the stool test. Please advise.

## 2022-09-05 NOTE — Telephone Encounter (Signed)
Pt calling back and states he is still having diarrhea, has had it since Saturday. Only blood when he wipes from going so frequently. Some cramping when he has diarrhea. No vomiting. He went to ER on Monday and he is better but wonders if this is his first flare. He is supposed to go out of the country tomorrow and wants to know if ok to do so and if medication needed he needs to get today. Please advise.

## 2022-09-05 NOTE — Telephone Encounter (Signed)
Yes, ok for lomotil without stool test, but if he feels sicker he will need to seek care while traveling [facility]

## 2022-09-05 NOTE — Addendum Note (Signed)
Addended by: Rosanne Sack R on: 09/05/2022 03:52 PM   Modules accepted: Orders

## 2022-09-05 NOTE — Telephone Encounter (Signed)
Pt aware and script for lomotil called in for pt.

## 2022-09-05 NOTE — Telephone Encounter (Signed)
I would be surprised if this is a Crohn's flare given the normal CT scan from 09/03/2022 More likely this is an acute viral or bacterial enteritis/colitis I would have him perform a GI pathogen panel, [facility] is also a possible option if he is in town as this will result very quickly, 24 hours or less We can make treatment decisions after that Once stool submitted he could use Lomotil 1 tablet 3 times daily as needed to help control diarrhea In the past he is use Bentyl 10 to 20 mg 3 times daily as needed for crampy abdominal pain which can be used as well Focus on hydration

## 2022-09-12 ENCOUNTER — Encounter: Payer: Self-pay | Admitting: Internal Medicine

## 2022-09-12 ENCOUNTER — Telehealth (INDEPENDENT_AMBULATORY_CARE_PROVIDER_SITE_OTHER): Payer: BC Managed Care – PPO | Admitting: Internal Medicine

## 2022-09-12 DIAGNOSIS — R1319 Other dysphagia: Secondary | ICD-10-CM

## 2022-09-12 DIAGNOSIS — R112 Nausea with vomiting, unspecified: Secondary | ICD-10-CM

## 2022-09-12 DIAGNOSIS — K5000 Crohn's disease of small intestine without complications: Secondary | ICD-10-CM | POA: Diagnosis not present

## 2022-09-12 DIAGNOSIS — K4090 Unilateral inguinal hernia, without obstruction or gangrene, not specified as recurrent: Secondary | ICD-10-CM

## 2022-09-12 DIAGNOSIS — K219 Gastro-esophageal reflux disease without esophagitis: Secondary | ICD-10-CM | POA: Diagnosis not present

## 2022-09-12 NOTE — Progress Notes (Signed)
Subjective:   This service was provided via telemedicine with audio/visual communication. The patient was located in [place] The provider was located in provider's GI office. The patient did consent to this telephone visit and is aware of possible charges through their insurance for this visit.   The persons participating in this telemedicine service were the patient, his fiance and I. Time spent on call: 25 min    Patient ID: Jacob [name], male    DOB: 1991/08/26, 31 y.o.   MRN: [id number]  HPI Jacob [name] is a 31 year old male with a history of Crohn's ileitis (diagnosis 2015), on Humira since spring 2023 previously azathioprine, previously treated H. pylori gastritis, IBS who is seen for follow-up.  He is seen by MyChart video visit with A/V communication.  He is currently traveling in [place] where he just recently got engaged to his fiance.  They will be returning home later next week.  He reports that the preponderance of his issues continue to be "from the chest up".  He is having frequent acid reflux and regurgitation.  He has nausea and at times vomiting of previously eaten food.  This can be from several hours before.  He feels a pressure sensation in his chest with swallowing and feels like food is not going down normally.  He is taking the Dexilant on a daily basis 60 mg.  He is also adding in famotidine at times and also Tums.  He did recently get over a 4 to 5 days rather moderate to severe diarrheal illness.  This is the second such acute gastrointestinal illness in the last year.  He had urgent diarrhea but nonbloody.  Only needed 1 Lomotil and symptoms abated as quickly as they started on about day 5.  No subsequent issues with diarrhea.  No blood in stool or mucus.  Stools are back to formed without abdominal pain.  He is continued Humira.  No further joint pains.  During this acute illness he was seen in the emergency department and had lab work and a CT scan.  See  below  He states with the Humira from that "chest down" he is about as good as he has ever been.  Review of Systems As per HPI, otherwise negative  Current Medications, Allergies, Past Medical History, Past Surgical History, Family History and Social History were reviewed in [employer] record.    Objective:   Physical Exam There were no vitals taken for this visit. Gen: awake, alert, NAD HEENT: anicteric, well appearing by video visit Neuro: nonfocal  CBC    Component Value Date/Time   WBC 4.9 09/03/2022 1002   RBC 5.43 09/03/2022 1002   HGB 17.2 (H) 09/03/2022 1002   HCT 49.6 09/03/2022 1002   PLT 233 09/03/2022 1002   MCV 91.3 09/03/2022 1002   MCH 31.7 09/03/2022 1002   MCHC 34.7 09/03/2022 1002   RDW 12.1 09/03/2022 1002   LYMPHSABS 1.2 01/23/2022 1136   MONOABS 0.4 01/23/2022 1136   EOSABS 0.0 01/23/2022 1136   BASOSABS 0.0 01/23/2022 1136   CMP     Component Value Date/Time   NA 135 09/03/2022 1002   NA 138 03/23/2019 0000   K 3.9 09/03/2022 1002   CL 98 09/03/2022 1002   CO2 25 09/03/2022 1002   GLUCOSE 92 09/03/2022 1002   BUN 11 09/03/2022 1002   BUN 10 03/23/2019 0000   CREATININE 1.05 09/03/2022 1002   CALCIUM 9.7 09/03/2022 1002   PROT  7.8 09/03/2022 1002   PROT 6.9 03/23/2019 0000   ALBUMIN 5.1 (H) 09/03/2022 1002   ALBUMIN 4.8 03/23/2019 0000   AST 30 09/03/2022 1002   ALT 49 (H) 09/03/2022 1002   ALKPHOS 71 09/03/2022 1002   BILITOT 0.8 09/03/2022 1002   BILITOT 0.5 03/23/2019 0000   GFRNONAA >60 09/03/2022 1002   GFRAA 128 03/23/2019 0000   COVID-19 negative  Lipase normal  CT ABDOMEN AND PELVIS WITH CONTRAST   TECHNIQUE: Multidetector CT imaging of the abdomen and pelvis was performed using the standard protocol following bolus administration of intravenous contrast.   RADIATION DOSE REDUCTION: This exam was performed according to the departmental dose-optimization program which includes automated exposure  control, adjustment of the mA and/or kV according to patient size and/or use of iterative reconstruction technique.   CONTRAST:  47m OMNIPAQUE IOHEXOL 300 MG/ML  SOLN   COMPARISON:  None Available.   FINDINGS: Lower chest: Lung bases are clear.   Hepatobiliary: No focal hepatic lesion. No biliary duct dilatation. Common bile duct is normal.   Pancreas: Pancreas is normal. No ductal dilatation. No pancreatic inflammation.   Spleen: Normal spleen   Adrenals/urinary tract: Stomach, small-bowel and cecum normal. Post appendectomy. The colon and rectosigmoid colon are normal.   Stomach/Bowel: Stomach, small bowel, appendix, and cecum are normal. The colon and rectosigmoid colon are normal.   Vascular/Lymphatic: Abdominal aorta is normal caliber. No periportal or retroperitoneal adenopathy. No pelvic adenopathy.   Reproductive: Unremarkable   Other: Small fat filled LEFT inguinal hernia measures 15 mm in diameter.   Musculoskeletal: No aggressive osseous lesion.   IMPRESSION: 1. No explanation for LEFT lower quadrant pain.  No diverticulosis. 2. Small fat filled LEFT inguinal hernia.     Electronically Signed   By: SSuzy BouchardM.D.   On: 09/03/2022 11:07         Assessment & Plan:  31year old male with a history of Crohn's ileitis (diagnosis 2015), on Humira since spring 2023 previously azathioprine, previously treated H. pylori gastritis, IBS who is seen for follow-up.   Recent acute viral gastroenteritis --second such episode in the last year or so.  Perhaps this is related to slight immunosuppression related to Humira.  This does not represent a flare of Crohn's and it has resolved.  Reassuring CT scan  2.  Crohn's ileitis (diagnosis 2015) --transition from azathioprine to Humira 40 mg every 14 days has been very effective for him.  Abdominal symptoms largely improved.  Joint symptoms also improved -- Continue Humira 40 mg every 14 days  3.  Reflux/dysphagia  symptom/nausea with intermittent vomiting --symptoms sound more esophageal than gastrectomy.  I did recommend previous 4-hour gastric emptying scan but this has not yet been performed.  He is on max dose PPI with Dexilant 60 mg daily.  I wonder if he could have EoE.  I have recommended upper endoscopy as opposed to gastric emptying scan for further evaluation and to guide treatment. -- [facility] [place] [facility] [place] [facility]30 on [place]/02/2022.  He will be here during that time for the Thanksgiving holiday -- Continue Dexilant 60 mg daily, he can use over-the-counter Gaviscon as instructed on the bottle for breakthrough symptoms until upper endoscopy  4.  Left inguinal hernia --small and asymptomatic.  We discussed this today.  I do not recommend surgical referral at this time however if this becomes symptomatic he should let me know.  He voices understanding  30 minutes total spent today including patient facing  time, coordination of care, reviewing medical history/procedures/pertinent radiology studies, and documentation of the encounter.

## 2022-09-13 ENCOUNTER — Other Ambulatory Visit: Payer: Self-pay

## 2022-09-13 DIAGNOSIS — K219 Gastro-esophageal reflux disease without esophagitis: Secondary | ICD-10-CM

## 2022-09-13 DIAGNOSIS — K4090 Unilateral inguinal hernia, without obstruction or gangrene, not specified as recurrent: Secondary | ICD-10-CM

## 2022-09-13 DIAGNOSIS — R112 Nausea with vomiting, unspecified: Secondary | ICD-10-CM

## 2022-09-13 DIAGNOSIS — R1319 Other dysphagia: Secondary | ICD-10-CM

## 2022-09-23 ENCOUNTER — Other Ambulatory Visit: Payer: Self-pay | Admitting: Internal Medicine

## 2022-09-25 ENCOUNTER — Encounter: Payer: Self-pay | Admitting: Certified Registered Nurse Anesthetist

## 2022-09-28 DIAGNOSIS — F419 Anxiety disorder, unspecified: Secondary | ICD-10-CM | POA: Diagnosis not present

## 2022-09-28 DIAGNOSIS — K219 Gastro-esophageal reflux disease without esophagitis: Secondary | ICD-10-CM | POA: Diagnosis not present

## 2022-10-02 ENCOUNTER — Ambulatory Visit (AMBULATORY_SURGERY_CENTER): Payer: BC Managed Care – PPO | Admitting: Internal Medicine

## 2022-10-02 ENCOUNTER — Encounter: Payer: Self-pay | Admitting: Internal Medicine

## 2022-10-02 VITALS — BP 122/79 | HR 70 | Temp 97.3°F | Resp 13 | Ht 67.0 in | Wt 202.6 lb

## 2022-10-02 DIAGNOSIS — K219 Gastro-esophageal reflux disease without esophagitis: Secondary | ICD-10-CM

## 2022-10-02 DIAGNOSIS — R1319 Other dysphagia: Secondary | ICD-10-CM

## 2022-10-02 MED ORDER — SODIUM CHLORIDE 0.9 % IV SOLN: 500.0000 mL | Freq: Once | INTRAVENOUS | Status: DC

## 2022-10-02 NOTE — Op Note (Signed)
[facility] Patient Name: Jacob [name] Procedure Date: 10/02/2022 7:30 AM MRN: [id number] Endoscopist: Jerene Bears , MD, [id number] Age: 31 Referring MD:  Date of Birth: [birth year] Gender: Male Account #: [id number] Procedure:                Upper GI endoscopy Indications:              Dysphagia, Gastro-esophageal reflux disease, hx of                            Crohn's ileitis (on Humira) Medicines:                Monitored Anesthesia Care Procedure:                Pre-Anesthesia Assessment:                           - Prior to the procedure, a History and Physical                            was performed, and patient medications and                            allergies were reviewed. The patient's tolerance of                            previous anesthesia was also reviewed. The risks                            and benefits of the procedure and the sedation                            options and risks were discussed with the patient.                            All questions were answered, and informed consent                            was obtained. Prior Anticoagulants: The patient has                            taken no anticoagulant or antiplatelet agents. ASA                            Grade Assessment: II - A patient with mild systemic                            disease. After reviewing the risks and benefits,                            the patient was deemed in satisfactory condition to                            undergo the procedure.  After obtaining informed consent, the endoscope was                            passed under direct vision. Throughout the                            procedure, the patient's blood pressure, pulse, and                            oxygen saturations were monitored continuously. The                            GIF [id number] was introduced through the                            mouth, and advanced to the  second part of duodenum.                            The upper GI endoscopy was accomplished without                            difficulty. The patient tolerated the procedure                            well. Scope In: Scope Out: Findings:                 The examined esophagus was normal. Multiple                            biopsies were obtained in the proximal esophagus                            and in the distal esophagus with cold forceps for                            histology.                           No endoscopic abnormality was evident in the                            esophagus to explain the patient's complaint of                            dysphagia. It was decided, however, to proceed with                            dilation of the entire esophagus. The scope was                            withdrawn. Dilation was performed with a Maloney                            dilator with mild resistance  at 42 Fr.                           The cardia and gastric fundus were normal on                            retroflexion.                           Mildly erythematous mucosa without bleeding was                            found in the gastric antrum. Biopsies were taken                            with a cold forceps for histology and Helicobacter                            pylori testing.                           The examined duodenum was normal. Complications:            No immediate complications. Estimated Blood Loss:     Estimated blood loss was minimal. Impression:               - Normal esophagus. Multiple biopsies were obtained                            in the proximal esophagus and in the distal                            esophagus.                           - No endoscopic esophageal abnormality to explain                            patient's dysphagia. Esophagus dilated with 54 Fr.                            Maloney.                           - Erythematous mucosa in  the antrum. Biopsied.                           - Normal examined duodenum. Recommendation:           - Patient has a contact number available for                            emergencies. The signs and symptoms of potential                            delayed complications were discussed with the  patient. Return to normal activities tomorrow.                            Written discharge instructions were provided to the                            patient.                           - Resume previous diet.                           - Continue present medications.                           - Await pathology results. Any changes in therapy                            to be determined after pathology results reviewed. Jerene Bears, MD 10/02/2022 7:56:55 AM This report has been signed electronically.

## 2022-10-02 NOTE — Progress Notes (Signed)
0730 Robinul 0.1 mg IV given due large amount of secretions upon assessment.  MD made aware, vss

## 2022-10-02 NOTE — Progress Notes (Signed)
Report given to PACU, vss 

## 2022-10-02 NOTE — Progress Notes (Signed)
Pt's states no medical or surgical changes since previsit or office visit. 

## 2022-10-02 NOTE — Progress Notes (Signed)
Called to room to assist during endoscopic procedure.  Patient ID and intended procedure confirmed with present staff. Received instructions for my participation in the procedure from the performing physician.  

## 2022-10-02 NOTE — Progress Notes (Signed)
See office note dated 09/12/22 for details and current H&P. He remains appropriate for EGD here today.  The nature of the procedure, as well as the risks, benefits, and alternatives were carefully and thoroughly reviewed with the patient. Ample time for discussion and questions allowed. The patient understood, was satisfied, and agreed to proceed.

## 2022-10-02 NOTE — Patient Instructions (Addendum)
-   Patient has a contact number available for emergencies. The signs and symptoms of potential delayed complications were discussed with the patient. Return to normal activities tomorrow. Written discharge instructions were provided to the patient. - Resume previous diet. - Continue present medications. - Await pathology results. Any changes in therapy to be determined after pathology results reviewed.  YOU HAD AN ENDOSCOPIC PROCEDURE TODAY AT [facility]:   Refer to the procedure report that was given to you for any specific questions about what was found during the examination.  If the procedure report does not answer your questions, please call your gastroenterologist to clarify.  If you requested that your care partner not be given the details of your procedure findings, then the procedure report has been included in a sealed envelope for you to review at your convenience later.  YOU SHOULD EXPECT: Some feelings of bloating in the abdomen. Passage of more gas than usual.  Walking can help get rid of the air that was put into your GI tract during the procedure and reduce the bloating. If you had a lower endoscopy (such as a colonoscopy or flexible sigmoidoscopy) you may notice spotting of blood in your stool or on the toilet paper. If you underwent a bowel prep for your procedure, you may not have a normal bowel movement for a few days.  Please Note:  You might notice some irritation and congestion in your nose or some drainage.  This is from the oxygen used during your procedure.  There is no need for concern and it should clear up in a day or so.  SYMPTOMS TO REPORT IMMEDIATELY:  Following upper endoscopy (EGD)  Vomiting of blood or coffee ground material  New chest pain or pain under the shoulder blades  Painful or persistently difficult swallowing  New shortness of breath  Fever of 100F or higher  Black, tarry-looking stools  For urgent or emergent issues, a  gastroenterologist can be reached at any hour by calling [phone]. Do not use MyChart messaging for urgent concerns.    DIET:  We do recommend a small meal at first, but then you may proceed to your regular diet.  Drink plenty of fluids but you should avoid alcoholic beverages for 24 hours.  ACTIVITY:  You should plan to take it easy for the rest of today and you should NOT DRIVE or use heavy machinery until tomorrow (because of the sedation medicines used during the test).    FOLLOW UP: Our staff will call the number listed on your records the next business day following your procedure.  We will call around 7:15- 8:00 am to check on you and address any questions or concerns that you may have regarding the information given to you following your procedure. If we do not reach you, we will leave a message.     If any biopsies were taken you will be contacted by phone or by letter within the next 1-3 weeks.  Please call us at [phone] if you have not heard about the biopsies in 3 weeks.    SIGNATURES/CONFIDENTIALITY: You and/or your care partner have signed paperwork which will be entered into your electronic medical record.  These signatures attest to the fact that that the information above on your After Visit Summary has been reviewed and is understood.  Full responsibility of the confidentiality of this discharge information lies with you and/or your care-partner.

## 2022-10-03 ENCOUNTER — Telehealth: Payer: Self-pay

## 2022-10-03 NOTE — Telephone Encounter (Signed)
Follow up call placed, VM obtained and message left.

## 2022-10-17 ENCOUNTER — Telehealth: Payer: Self-pay | Admitting: Internal Medicine

## 2022-10-17 ENCOUNTER — Other Ambulatory Visit: Payer: Self-pay

## 2022-10-17 MED ORDER — DICYCLOMINE HCL 20 MG PO TABS: ORAL_TABLET | ORAL | 1 refills | Status: AC

## 2022-10-17 MED ORDER — ONDANSETRON 4 MG PO TBDP: 4.0000 mg | ORAL_TABLET | Freq: Three times a day (TID) | ORAL | 0 refills | Status: DC | PRN

## 2022-10-17 MED ORDER — DEXLANSOPRAZOLE 60 MG PO CPDR: 60.0000 mg | DELAYED_RELEASE_CAPSULE | Freq: Every day | ORAL | 0 refills | Status: DC

## 2022-10-17 NOTE — Telephone Encounter (Signed)
Patient is calling states he needs his Dexlansoprazole refilled states he couldn't get to the pharmacy to pick it up in time last time. Also says he needs Dicyclomine and Zofran refilled requesting it to be sent to out of town pharmacy [facility] [place], [place]. Please advise

## 2022-10-17 NOTE — Telephone Encounter (Signed)
Yes can refill

## 2022-11-07 DIAGNOSIS — F419 Anxiety disorder, unspecified: Secondary | ICD-10-CM | POA: Diagnosis not present

## 2022-12-14 ENCOUNTER — Other Ambulatory Visit: Payer: Self-pay

## 2022-12-14 ENCOUNTER — Telehealth: Payer: Self-pay | Admitting: Pharmacy Technician

## 2022-12-14 ENCOUNTER — Other Ambulatory Visit (HOSPITAL_COMMUNITY): Payer: Self-pay

## 2022-12-14 MED ORDER — HUMIRA (2 PEN) 40 MG/0.4ML ~~LOC~~ AJKT
1.0000 "pen " | AUTO-INJECTOR | SUBCUTANEOUS | 6 refills | Status: DC
  Filled 2022-12-14: qty 2, fill #0
  Filled 2022-12-18: qty 2, 28d supply, fill #0

## 2022-12-14 NOTE — Telephone Encounter (Signed)
Script sent to [facility].

## 2022-12-14 NOTE — Telephone Encounter (Signed)
[facility] received a fax from [facility] that they are no longer able to fill for this patient. New script will need to be sent to [facility] to be filled.

## 2022-12-18 ENCOUNTER — Other Ambulatory Visit: Payer: Self-pay

## 2022-12-18 ENCOUNTER — Other Ambulatory Visit (HOSPITAL_COMMUNITY): Payer: Self-pay

## 2022-12-18 NOTE — Telephone Encounter (Signed)
Pt insurance previously allowed fills with [facility]. [facility] received notice that patient is locked in to fill with [facility], and can no longer fill with [facility]. New script was sent to [facility], however we are unable to ship out of state. Additionally pt may need additional funds added to copay card. [employer]oucher (copay card) only applied $1200 to py copay from insurance. [facility] can only ship to [place] or [place]. Pt needs medication shipped to [place].

## 2022-12-18 NOTE — Telephone Encounter (Signed)
Noted  

## 2022-12-21 ENCOUNTER — Other Ambulatory Visit: Payer: Self-pay

## 2022-12-21 MED ORDER — HUMIRA (2 PEN) 40 MG/0.4ML ~~LOC~~ AJKT: 40.0000 mg | AUTO-INJECTOR | SUBCUTANEOUS | 6 refills | Status: DC

## 2022-12-21 NOTE — Telephone Encounter (Signed)
Script sent to the appropriate pharmacy. Pt notified via mychart.

## 2022-12-21 NOTE — Addendum Note (Signed)
Addended by: Rosanne Sack R on: 12/21/2022 09:03 AM   Modules accepted: Orders

## 2022-12-27 ENCOUNTER — Other Ambulatory Visit: Payer: Self-pay

## 2022-12-27 MED ORDER — HUMIRA (2 PEN) 40 MG/0.4ML ~~LOC~~ AJKT: 1.0000 "pen " | AUTO-INJECTOR | SUBCUTANEOUS | 6 refills | Status: DC

## 2023-02-04 ENCOUNTER — Other Ambulatory Visit: Payer: Self-pay | Admitting: Internal Medicine

## 2023-02-05 MED ORDER — ONDANSETRON 4 MG PO TBDP: 4.0000 mg | ORAL_TABLET | Freq: Three times a day (TID) | ORAL | 0 refills | Status: AC | PRN

## 2023-02-05 MED ORDER — DEXLANSOPRAZOLE 60 MG PO CPDR: 60.0000 mg | DELAYED_RELEASE_CAPSULE | Freq: Every day | ORAL | 0 refills | Status: DC

## 2023-06-03 ENCOUNTER — Telehealth: Payer: Self-pay | Admitting: Pharmacy Technician

## 2023-06-03 ENCOUNTER — Telehealth: Payer: Self-pay | Admitting: Internal Medicine

## 2023-06-03 ENCOUNTER — Encounter: Payer: Self-pay | Admitting: Internal Medicine

## 2023-06-03 ENCOUNTER — Other Ambulatory Visit (HOSPITAL_COMMUNITY): Payer: Self-pay

## 2023-06-03 MED ORDER — HUMIRA (2 PEN) 40 MG/0.4ML ~~LOC~~ AJKT: 40.0000 mg | AUTO-INJECTOR | SUBCUTANEOUS | 6 refills | Status: DC

## 2023-06-03 NOTE — Telephone Encounter (Signed)
Patient notified of approval via MyChart. Humira 40 mg refill [facility].

## 2023-06-03 NOTE — Telephone Encounter (Signed)
Pharmacy Patient Advocate Encounter   Received notification from CoverMyMeds that prior authorization for HUMIRA 40MG  is required/requested.   Insurance verification completed.   The patient is insured through [facility] .   Per test claim: PA submitted to [employer] via CoverMyMeds Key/confirmation #/EOC [facility] Status is pending

## 2023-06-03 NOTE — Telephone Encounter (Signed)
Patient sent MyChart message as well. This has been sent to [facility] urgently.

## 2023-06-03 NOTE — Telephone Encounter (Signed)
Inbound call from patient stating he needs prior auth for his Humira medication. States he needs to take medication on Wednesday 7/24 otherwise he will get really sick. Requesting a call back. Please advise, thank you.

## 2023-06-03 NOTE — Telephone Encounter (Signed)
Pharmacy Patient Advocate Encounter  Received notification from [facility] that Prior Authorization for HUMIRA 40MG  has been APPROVED from 7.22.24 to -..  PA #/Case ID/Reference #: [id number]  Spoke with Pharmacy to process. Copay is $0

## 2023-06-04 ENCOUNTER — Other Ambulatory Visit (HOSPITAL_COMMUNITY): Payer: Self-pay

## 2023-06-04 NOTE — Telephone Encounter (Signed)
See Humira Prior auth telephone note dated 06/03/23

## 2023-06-06 ENCOUNTER — Telehealth: Payer: Self-pay | Admitting: *Deleted

## 2023-06-06 ENCOUNTER — Encounter: Payer: Self-pay | Admitting: Internal Medicine

## 2023-06-06 NOTE — Telephone Encounter (Signed)
Would have patient redose now and then go back to the every 2-week schedule

## 2023-06-06 NOTE — Telephone Encounter (Signed)
Called patient to inform of recommendations per Dr. Rhea Belton. Patient is concerned about the cost and having to dose again in reference to the number of medications he is to receive. Suggested the patient call the pharmacist to see if it would make any difference in response to cost or number of medication injections. Patient agreed.

## 2023-06-06 NOTE — Telephone Encounter (Signed)
Dr. Rhea Belton, not sure what to do with this especially since the patient is not sure as to whether he received his full dose and the fact that the medication he states looked foamy. Please advise.

## 2023-10-15 ENCOUNTER — Telehealth: Payer: Self-pay

## 2023-10-15 ENCOUNTER — Telehealth (INDEPENDENT_AMBULATORY_CARE_PROVIDER_SITE_OTHER): Payer: BC Managed Care – PPO | Admitting: Internal Medicine

## 2023-10-15 DIAGNOSIS — K5000 Crohn's disease of small intestine without complications: Secondary | ICD-10-CM

## 2023-10-15 NOTE — Telephone Encounter (Signed)
Patient called would like to reschedule please assist.

## 2023-10-15 NOTE — Telephone Encounter (Signed)
Patient was scheduled for a video visit this morning with Dr. Rhea Belton.  I tried calling 3 times but no answer from the patient.

## 2023-10-16 NOTE — Telephone Encounter (Signed)
Called patient to advise left voicemail with details.

## 2023-10-16 NOTE — Progress Notes (Signed)
Pt no-showed his virtual visit JMP

## 2023-10-16 NOTE — Telephone Encounter (Signed)
Pt scheduled for mychart video visit with Dr. Rhea Belton 11/28/23 at 2:30pm. Please notify pt of appt date and time.

## 2023-11-08 ENCOUNTER — Telehealth: Payer: Self-pay

## 2023-11-08 MED ORDER — DEXLANSOPRAZOLE 60 MG PO CPDR: 60.0000 mg | DELAYED_RELEASE_CAPSULE | Freq: Every day | ORAL | 0 refills | Status: DC

## 2023-11-08 NOTE — Telephone Encounter (Signed)
Generic dexilant refilled and note attached to call for appointment. He missed his last video appointment.

## 2023-11-28 ENCOUNTER — Telehealth: Payer: BC Managed Care – PPO | Admitting: Internal Medicine

## 2023-11-28 DIAGNOSIS — R1013 Epigastric pain: Secondary | ICD-10-CM

## 2023-11-28 DIAGNOSIS — K589 Irritable bowel syndrome without diarrhea: Secondary | ICD-10-CM

## 2023-11-28 DIAGNOSIS — K5000 Crohn's disease of small intestine without complications: Secondary | ICD-10-CM

## 2023-11-28 DIAGNOSIS — E7800 Pure hypercholesterolemia, unspecified: Secondary | ICD-10-CM

## 2023-11-28 DIAGNOSIS — K219 Gastro-esophageal reflux disease without esophagitis: Secondary | ICD-10-CM | POA: Diagnosis not present

## 2023-11-29 ENCOUNTER — Other Ambulatory Visit: Payer: Self-pay | Admitting: Internal Medicine

## 2023-11-29 ENCOUNTER — Encounter: Payer: Self-pay | Admitting: Internal Medicine

## 2023-11-29 ENCOUNTER — Telehealth: Payer: Self-pay

## 2023-11-29 MED ORDER — DEXLANSOPRAZOLE 60 MG PO CPDR: 60.0000 mg | DELAYED_RELEASE_CAPSULE | Freq: Every day | ORAL | 3 refills | Status: DC

## 2023-11-29 MED ORDER — HUMIRA (2 PEN) 40 MG/0.4ML ~~LOC~~ AJKT: 40.0000 mg | AUTO-INJECTOR | SUBCUTANEOUS | 6 refills | Status: DC

## 2023-11-29 NOTE — Telephone Encounter (Signed)
-----   Message from Carie Caddy Pyrtle sent at 11/29/2023  1:09 PM EST ----- Video visit Refill humira and dexilant Thanks JMP

## 2023-11-29 NOTE — Telephone Encounter (Signed)
Humira not covered by insurance

## 2023-11-29 NOTE — Telephone Encounter (Signed)
Refills have been sent as requested.  

## 2023-11-29 NOTE — Progress Notes (Signed)
Subjective:    Patient ID: Jacob [name], male    DOB: 04-21-1991, 33 y.o.   MRN: [id number]  This service was provided via telemedicine with audio/visual communication. The patient was located at home The provider was located in provider's GI office. The patient did consent to this telephone visit and is aware of possible charges through their insurance for this visit.   The persons participating in this telemedicine service were the patient and I. Time spent on call: 20 min   HPI Jacob [name] is a 33 year old male with a history of Crohn's ileitis (diagnosis 2015), on Humira since spring 2023 previously azathioprine, previously treated H. pylori gastritis, IBS who is seen for follow-up.  He is seen by MyChart video visit with A/V communication.   The patient, with a history of Crohn's disease, reports overall improvement in his health status. He notes a significant improvement in swallowing difficulties following an esophageal dilation procedure performed a year ago. The patient reports that the procedure provided substantial relief for a few months post-procedure. He is currently on Humira, which he takes every two weeks, and reports no issues with this medication. The patient denies any abdominal pain and states that he has been feeling the best he has in a long time, with the exception of the day of the call, which he attributes to late-night eating.  The patient recently got married and reports that his spouse, a physician, suggested he mention a recent diagnosis of high cholesterol by his primary care physician. The patient's father also takes medication for cholesterol, but the patient is unsure if high cholesterol runs in his family. He has been prescribed a statin medication, which he has yet to start.  The patient also mentions that he has been taking CBD for a few weeks, primarily for anxiety, but has noticed an improvement in his gut health since starting the supplement. He also  occasionally undertakes a water fast, lasting one to three days, during which he only consumes water and, after a day and a half, bone broth and electrolytes. He reports feeling good after these fasts.  The patient's last colonoscopy was five years ago, at which time his Crohn's disease was in remission with no colonic abnormalities. The patient's Crohn's has been limited to the small bowel.  Review of Systems As per HPI, otherwise negative  Current Medications, Allergies, Past Medical History, Past Surgical History, Family History and Social History were reviewed in [employer] record.    Objective:   Physical Exam There were no vitals taken for this visit. Gen: awake, alert, NAD, well-appearing by MyChart video visit HEENT: anicteric  Neuro: nonfocal     Assessment & Plan:   33 year old male with a history of Crohn's ileitis (diagnosis 2015), on Humira since spring 2023 previously azathioprine, previously treated H. pylori gastritis, IBS who is seen for follow-up.  Crohn's ileitis (diagnosis 2015 ) --previous azathioprine transition to Humira 40 mg every day.  This has been very effective for him.  Continue current doses --Humira 40 mg every 14 days --Annual TB testing  2.  GERD with dysphagia/dyspepsia --symptoms well-controlled.  Dilation helped swallowing.  Continue Dexilant 60 mg daily.  Can use famotidine 20 mg twice daily as needed --Dexilant 60 mg daily --Famotidine 20 mg twice daily as needed  3.  Hypercholesterolemia Recently diagnosed and starting on statin therapy. No known contraindications with current medications. -Start statin as prescribed by primary care physician. -Check liver enzymes 2-3 months after starting  statin therapy.  4. General Health Maintenance -Next colonoscopy due at age 52 or sooner if Crohn's symptoms flare up.  Annual follow-up, sooner if needed  30 minutes total spent today including patient facing time, coordination  of care, reviewing medical history/procedures/pertinent radiology studies, and documentation of the encounter.

## 2023-12-02 ENCOUNTER — Other Ambulatory Visit (HOSPITAL_COMMUNITY): Payer: Self-pay

## 2023-12-02 NOTE — Telephone Encounter (Signed)
PA is not needed for Humira. PA is still active until July this year. Was calling the insurance to see if something had changed. Insurance is closed due to holiday. Patient insurance likely no longer covers Humira, and will have to change biosimilars.

## 2023-12-03 ENCOUNTER — Telehealth: Payer: Self-pay | Admitting: Pharmacy Technician

## 2023-12-03 ENCOUNTER — Other Ambulatory Visit (HOSPITAL_COMMUNITY): Payer: Self-pay

## 2023-12-03 NOTE — Telephone Encounter (Signed)
Pharmacy Patient Advocate Encounter   Received notification from Pt Calls Messages that prior authorization for DEXLANSOPRAZOLE 60MG  is required/requested.   Insurance verification completed.   The patient is insured through [facility] .   Per test claim: PA required; PA submitted to above mentioned insurance via CoverMyMeds Key/confirmation #/EOC [id number] Status is pending

## 2023-12-05 ENCOUNTER — Other Ambulatory Visit (HOSPITAL_COMMUNITY): Payer: Self-pay

## 2023-12-05 NOTE — Telephone Encounter (Signed)
Pharmacy Patient Advocate Encounter  Received notification from [facility] that Prior Authorization for DEXLANSOPRAZOLE 60MG  has been APPROVED from 1.22.25 to 1.21.26   PA #/Case ID/Reference #: [id number]

## 2023-12-27 ENCOUNTER — Other Ambulatory Visit: Payer: Self-pay

## 2023-12-27 MED ORDER — HUMIRA (2 PEN) 40 MG/0.4ML ~~LOC~~ AJKT: 40.0000 mg | AUTO-INJECTOR | SUBCUTANEOUS | 6 refills | Status: DC

## 2024-01-31 ENCOUNTER — Telehealth: Payer: Self-pay

## 2024-01-31 ENCOUNTER — Other Ambulatory Visit (HOSPITAL_COMMUNITY): Payer: Self-pay

## 2024-01-31 NOTE — Telephone Encounter (Signed)
 Pharmacy Patient Advocate Encounter   Received notification from CoverMyMeds that prior authorization for Humira (2 Pen) (CF) 40MG /0.4ML auto-injector kit is required/requested.   Insurance verification completed.   The patient is insured through [facility] .   Per test claim: PA required; PA submitted to above mentioned insurance via CoverMyMeds Key/confirmation #/EOC [facility] Status is pending

## 2024-02-04 NOTE — Telephone Encounter (Signed)
 Pharmacy Patient Advocate Encounter  Received notification from [facility] that Prior Authorization for Humira (2 Pen) (CF) 40MG /0.4ML auto-injector kit has been CANCELLED due to prior authorization approval already on file effective until 06-02-2024   PA #/Case ID/Reference #: [id number]

## 2024-06-15 ENCOUNTER — Other Ambulatory Visit: Payer: Self-pay

## 2024-06-15 ENCOUNTER — Encounter: Payer: Self-pay | Admitting: Internal Medicine

## 2024-06-15 MED ORDER — DEXLANSOPRAZOLE 60 MG PO CPDR: 60.0000 mg | DELAYED_RELEASE_CAPSULE | Freq: Every day | ORAL | 0 refills | Status: DC

## 2024-06-15 MED ORDER — DEXLANSOPRAZOLE 60 MG PO CPDR: 60.0000 mg | DELAYED_RELEASE_CAPSULE | Freq: Every day | ORAL | 0 refills | Status: AC

## 2024-06-19 ENCOUNTER — Telehealth: Payer: Self-pay | Admitting: Internal Medicine

## 2024-06-19 NOTE — Telephone Encounter (Signed)
 Crystal with [facility] is calling about a PA for Humira . Can be contacted through covermymeds or [phone] option 3

## 2024-06-22 ENCOUNTER — Other Ambulatory Visit (HOSPITAL_COMMUNITY): Payer: Self-pay

## 2024-06-22 ENCOUNTER — Telehealth: Payer: Self-pay

## 2024-06-22 NOTE — Telephone Encounter (Signed)
 PA request has been Submitted. New Encounter has been or will be created for follow up. For additional info see Pharmacy Prior Auth telephone encounter from 06-22-2024.

## 2024-06-22 NOTE — Telephone Encounter (Signed)
 Pharmacy Patient Advocate Encounter   Received notification from Pt Calls Messages that prior authorization for Humira  (2 Pen) (CF) 40MG /0.4ML auto-injector kit is required/requested.   Insurance verification completed.   The patient is insured through [facility] .   Per test claim: PA required; PA submitted to above mentioned insurance via Latent Key/confirmation #/EOC [id number] Status is pending

## 2024-06-23 NOTE — Telephone Encounter (Signed)
 Pharmacy Patient Advocate Encounter  Received notification from [facility] that Prior Authorization for Humira  (2 Pen) (CF) 40MG /0.4ML auto-injector kit has been APPROVED from 06-23-2024 to 06-22-2025   PA #/Case ID/Reference #: [id number]

## 2024-07-28 ENCOUNTER — Other Ambulatory Visit: Payer: Self-pay | Admitting: Internal Medicine

## 2024-07-28 ENCOUNTER — Encounter: Payer: Self-pay | Admitting: Internal Medicine

## 2024-09-13 ENCOUNTER — Ambulatory Visit: Payer: BLUE CROSS/BLUE SHIELD

## 2024-09-13 DIAGNOSIS — R1110 Vomiting, unspecified: Principal | ICD-10-CM

## 2024-09-13 DIAGNOSIS — R197 Diarrhea, unspecified: Secondary | ICD-10-CM

## 2024-09-13 DIAGNOSIS — K50919 Crohn's disease, unspecified, with unspecified complications: Secondary | ICD-10-CM

## 2024-09-13 MED ADMIN — SODIUM CHLORIDE 0.9 % IV BOLUS: 1000 mL | INTRAVENOUS | @ 23:00:00 | Stop: 2024-09-13 | NDC 00338004904

## 2024-09-13 NOTE — Progress Notes
 Chief Complaint   Patient presents with    vomiting and diarrhea     2 days          S: Anthony Salazar is a 33 y.o. yo male , new patient with h/o Crohns ileitis ( diagnosis in 2015), treated with Humira, IBS and previously treated H. Pylori gastritis who presents for vomiting and diarrhea for 2 days. States that the morning after halloween he vomited multiple times for 1 hr. And had multiple episodes of diarrhea. States he ate a lot of ''junk food'' the night before. A lot the food was shared, but he has no ill contacts.   He has had multiple episodes of vomiting and diarrhea since. States that he can tolerate small sips of water, broth and a little bit of rice without diarrhea, but has intermittent vomiting. Had body aches the first day, but those are better.   There is no blood or coffee grounds in vomitus, no blood in stool and no melena. Denies fever, chills. Has no new abdominal pain above baseline.   Has felt weak and lightheaded. Has no headache, visual changes, URI sx, no dysuria, no hematuria.     He gets care from [facility], but his Crohns is managed by Dr. Albertus in [place] . He is on Humira 2x/ mth, pepcid, zofran prn and klonopin.   His wife is a optometrist.     Medication:  Outpatient Medications Prior to Visit   Medication Sig    clonazePAM 0.5 mg tablet     HUMIRA, 2 PEN, 40 MG/0.4ML AJKT INJECT 1 PEN UNDER THE SKIN EVERY 14 DAYS    propranolol 20 mg tablet     rosuvastatin 5 mg tablet     dexlansoprazole 60 mg DR capsule Take 1 capsule (60 mg total) by mouth daily.    dicyclomine 10 mg capsule dicyclomine 10 mg capsule   TK 1 C PO Q 6 H PRF CRAMPING    ondansetron ODT 4 mg disintegrating tablet     ALPRAZolam 0.5 mg tablet Take 0.5 mg by mouth.    azaTHIOprine 50 mg tablet Take 50 mg by mouth three (3) times daily.               No facility-administered medications prior to visit.       Allergies: No Known Allergies    ROS: see HPI     reports that he has never smoked. He has never used smokeless tobacco.    family history is not on file.    O:  Constitutional:Well developed, well nourished,    Appears tired  He is in the room with wife Anthony Salazar, a pediatrician.    BP 113/73  ~ Pulse 75  ~ Temp 36.9 ?C (98.4 ?F) (Oral)  ~ Resp 16  ~ SpO2 97%   There is no height or weight on file to calculate BMI.   States he feels dizzy when moving from laying down to sitting.   Cardiovascular: S1 and S2 normal, no murmurs, clicks, gallops or rubs. Regular rate and rhythm.  Respiratory:  Chest is clear; no wheezes or rales.  Gi: The abdomen is soft with diffuse tenderness but no guarding, mass, rebound or organomegaly. Bowel sounds are normal.   MSK:Ext: no clubbing, cyanosis, edema.  gait wnl   Skin: no obvious lesions. Appropriate for age  Neuro:  Alert and oriented x 4   Psychiatric:  normal mood and affect, normal thought content.  STUDIES:     Relevant labs and imaging were reviewed today by me in conjunction with the patient.    Office Visit on 07/06/2021   Component Date Value Ref Range Status    Rapid Strep A Screen, Manual 07/06/2021 Negative  Negative Final    Specimen Type 07/06/2021 Respiratory, Upper   Final    COVID-19 PCR/TMA 07/06/2021 Not Detected  Not Detected Final    A Not Detected (negative) test result does not preclude 2019-nCoV infection and should not be used as the sole basis for treatment or other patient management decisions. Not Detected (negative) results must be combined with clinical observations, patient history, and epidemiological information.      Influenza A PCR 07/06/2021 Not Detected  Not Detected Final    Influenza B PCR 07/06/2021 Not Detected  Not Detected Final    RSV PCR 07/06/2021 Not Detected  Not Detected Final       No imaging has been resulted in the last 365 days     ASSESSMENT & PLAN:       Diagnoses and all orders for this visit:  Pt with Crohns ileitis now with recurrent vomiting, diarrhea and dizziness. Will hydrate with 1 L ns. And check labs.   The 'BRAT' diet ( bananas, rice, apple juice, toast)    is suggested, then progress to diet as tolerated as symptoms abate. Call if bloody stools, persistent diarrhea, vomiting, fever or abdominal pain.     Vomiting and diarrhea  -     CBC & Plt & Diff; Future  -     Comprehensive Metabolic Panel; Future  -     sodium chloride 0.9% IV soln bolus 1,000 mL    Crohn's ileitis, unspecified complication (HCC/RAF)  Followed by dr. Albertus in [place] . Continue    Dehydration    Pt felt much better after the normal saline infusion.   May take zofran for nausea ( home supply)    Follow-up: Return if symptoms worsen or fail to improve, for follow up with pcp.    Call or return to clinic prn if these symptoms worsen or fail to improve as anticipated.    The above plan was explained and reviewed extensively with the patient.  All questions were answered.      Kendon Sedeno, MD      Ps. 09/15/2024 Followed up with phone call, for sx. States he is back to his baseline, doing well after the IV fluid. States he does not need the labs retake, since initial sample was rejected at the lab.

## 2024-09-13 NOTE — Nursing Clinical Note
 IV started  # 24 gauge , 1st attempt. Without problems.  Intravenous fluids were administered, normal saline 1000 ml's.  On pump for 1 hour.    IV catheter discontinued intact. Site without signs and symptoms of complications. Dressing and pressure applied.    AVS given to patient/ Left clinic in stable condition

## 2024-09-14 ENCOUNTER — Telehealth: Payer: BLUE CROSS/BLUE SHIELD

## 2024-09-14 NOTE — Telephone Encounter
 Results Request - The patient would like to discuss the results of their recent tests.     1) What type of test(s)? labs    2) When was it performed? 09/13/24    3) Where was it performed? [facility]    If [place], are results available in [facility]? yes   If outside facility, what is their phone number?     Patient or caller has been notified of the turnaround time of 1-2 business day(s).

## 2024-09-15 ENCOUNTER — Ambulatory Visit: Payer: BLUE CROSS/BLUE SHIELD

## 2024-09-15 ENCOUNTER — Other Ambulatory Visit: Payer: BLUE CROSS/BLUE SHIELD

## 2024-09-15 DIAGNOSIS — E860 Dehydration: Secondary | ICD-10-CM

## 2024-09-15 DIAGNOSIS — R197 Diarrhea, unspecified: Principal | ICD-10-CM

## 2024-09-15 DIAGNOSIS — R1110 Vomiting, unspecified: Secondary | ICD-10-CM
